# Patient Record
Sex: Male | Born: 1940
Health system: Southern US, Community
[De-identification: ages and names within clinical notes are randomized; demographics above are authoritative.]

## PROBLEM LIST (undated history)

## (undated) DIAGNOSIS — E7800 Pure hypercholesterolemia, unspecified: Secondary | ICD-10-CM

## (undated) DIAGNOSIS — I10 Essential (primary) hypertension: Secondary | ICD-10-CM

## (undated) DIAGNOSIS — Z794 Long term (current) use of insulin: Secondary | ICD-10-CM

## (undated) DIAGNOSIS — E119 Type 2 diabetes mellitus without complications: Secondary | ICD-10-CM

## (undated) DIAGNOSIS — H9190 Unspecified hearing loss, unspecified ear: Secondary | ICD-10-CM

## (undated) DIAGNOSIS — E871 Hypo-osmolality and hyponatremia: Secondary | ICD-10-CM

## (undated) HISTORY — PX: HERNIA REPAIR: SHX51

## (undated) HISTORY — PX: GUM SURGERY: SHX658

---
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
## 2019-10-08 DIAGNOSIS — I10 Essential (primary) hypertension: Secondary | ICD-10-CM | POA: Diagnosis not present

## 2019-10-08 DIAGNOSIS — E7800 Pure hypercholesterolemia, unspecified: Secondary | ICD-10-CM | POA: Diagnosis not present

## 2019-10-08 DIAGNOSIS — Z794 Long term (current) use of insulin: Secondary | ICD-10-CM | POA: Diagnosis not present

## 2019-10-08 DIAGNOSIS — E1165 Type 2 diabetes mellitus with hyperglycemia: Secondary | ICD-10-CM | POA: Diagnosis not present

## 2019-10-11 DIAGNOSIS — E119 Type 2 diabetes mellitus without complications: Secondary | ICD-10-CM | POA: Diagnosis not present

## 2019-10-29 DIAGNOSIS — Z794 Long term (current) use of insulin: Secondary | ICD-10-CM | POA: Diagnosis not present

## 2019-10-29 DIAGNOSIS — E1165 Type 2 diabetes mellitus with hyperglycemia: Secondary | ICD-10-CM | POA: Diagnosis not present

## 2019-11-15 DIAGNOSIS — Z79899 Other long term (current) drug therapy: Secondary | ICD-10-CM | POA: Diagnosis not present

## 2019-11-15 DIAGNOSIS — E7800 Pure hypercholesterolemia, unspecified: Secondary | ICD-10-CM | POA: Diagnosis not present

## 2019-11-15 DIAGNOSIS — E1165 Type 2 diabetes mellitus with hyperglycemia: Secondary | ICD-10-CM | POA: Diagnosis not present

## 2019-11-15 DIAGNOSIS — Z125 Encounter for screening for malignant neoplasm of prostate: Secondary | ICD-10-CM | POA: Diagnosis not present

## 2019-11-22 DIAGNOSIS — Z0000 Encounter for general adult medical examination without abnormal findings: Secondary | ICD-10-CM | POA: Diagnosis not present

## 2019-11-22 DIAGNOSIS — Z79899 Other long term (current) drug therapy: Secondary | ICD-10-CM | POA: Diagnosis not present

## 2019-11-22 DIAGNOSIS — Z1331 Encounter for screening for depression: Secondary | ICD-10-CM | POA: Diagnosis not present

## 2020-01-14 ENCOUNTER — Ambulatory Visit: Admit: 2020-01-14 | Payer: Self-pay | Admitting: Ophthalmology

## 2020-01-14 SURGERY — PHACOEMULSIFICATION, CATARACT, WITH IOL INSERTION
Anesthesia: Topical | Laterality: Left

## 2020-01-31 DIAGNOSIS — Z794 Long term (current) use of insulin: Secondary | ICD-10-CM | POA: Diagnosis not present

## 2020-01-31 DIAGNOSIS — I10 Essential (primary) hypertension: Secondary | ICD-10-CM | POA: Diagnosis not present

## 2020-01-31 DIAGNOSIS — E7800 Pure hypercholesterolemia, unspecified: Secondary | ICD-10-CM | POA: Diagnosis not present

## 2020-01-31 DIAGNOSIS — E1165 Type 2 diabetes mellitus with hyperglycemia: Secondary | ICD-10-CM | POA: Diagnosis not present

## 2020-02-07 DIAGNOSIS — H2512 Age-related nuclear cataract, left eye: Secondary | ICD-10-CM | POA: Diagnosis not present

## 2020-02-07 DIAGNOSIS — I10 Essential (primary) hypertension: Secondary | ICD-10-CM | POA: Diagnosis not present

## 2020-02-19 ENCOUNTER — Encounter: Payer: Self-pay | Admitting: Ophthalmology

## 2020-02-19 ENCOUNTER — Other Ambulatory Visit: Payer: Self-pay

## 2020-02-21 ENCOUNTER — Other Ambulatory Visit
Admission: RE | Admit: 2020-02-21 | Discharge: 2020-02-21 | Disposition: A | Discharge status: Home / Self Care | Payer: Medicare HMO | Source: Ambulatory Visit | Attending: Ophthalmology | Admitting: Ophthalmology

## 2020-02-21 ENCOUNTER — Other Ambulatory Visit: Payer: Self-pay

## 2020-02-21 DIAGNOSIS — Z20822 Contact with and (suspected) exposure to covid-19: Secondary | ICD-10-CM | POA: Insufficient documentation

## 2020-02-21 DIAGNOSIS — Z01812 Encounter for preprocedural laboratory examination: Secondary | ICD-10-CM | POA: Diagnosis not present

## 2020-02-21 LAB — SARS CORONAVIRUS 2 (TAT 6-24 HRS): SARS Coronavirus 2: NEGATIVE

## 2020-02-21 NOTE — Discharge Instructions (Signed)

## 2020-02-25 ENCOUNTER — Encounter: Payer: Self-pay | Admitting: Ophthalmology

## 2020-02-25 ENCOUNTER — Encounter
Admission: RE | Disposition: A | Discharge status: Home / Self Care | Payer: Self-pay | Source: Home / Self Care | Attending: Ophthalmology

## 2020-02-25 ENCOUNTER — Ambulatory Visit: Payer: Medicare HMO | Admitting: Anesthesiology

## 2020-02-25 ENCOUNTER — Ambulatory Visit
Admission: RE | Admit: 2020-02-25 | Discharge: 2020-02-25 | Disposition: A | Discharge status: Home / Self Care | Payer: Medicare HMO | Attending: Ophthalmology | Admitting: Ophthalmology

## 2020-02-25 ENCOUNTER — Other Ambulatory Visit: Payer: Self-pay

## 2020-02-25 DIAGNOSIS — Z79899 Other long term (current) drug therapy: Secondary | ICD-10-CM | POA: Diagnosis not present

## 2020-02-25 DIAGNOSIS — Z87891 Personal history of nicotine dependence: Secondary | ICD-10-CM | POA: Diagnosis not present

## 2020-02-25 DIAGNOSIS — H2512 Age-related nuclear cataract, left eye: Secondary | ICD-10-CM | POA: Insufficient documentation

## 2020-02-25 DIAGNOSIS — Z794 Long term (current) use of insulin: Secondary | ICD-10-CM | POA: Diagnosis not present

## 2020-02-25 DIAGNOSIS — H25812 Combined forms of age-related cataract, left eye: Secondary | ICD-10-CM | POA: Diagnosis not present

## 2020-02-25 DIAGNOSIS — Z7982 Long term (current) use of aspirin: Secondary | ICD-10-CM | POA: Diagnosis not present

## 2020-02-25 HISTORY — DX: Unspecified hearing loss, unspecified ear: H91.90

## 2020-02-25 HISTORY — DX: Pure hypercholesterolemia, unspecified: E78.00

## 2020-02-25 HISTORY — DX: Essential (primary) hypertension: I10

## 2020-02-25 HISTORY — PX: CATARACT EXTRACTION W/PHACO: SHX586

## 2020-02-25 HISTORY — DX: Type 2 diabetes mellitus without complications: E11.9

## 2020-02-25 LAB — GLUCOSE, CAPILLARY
Glucose-Capillary: 103 mg/dL — ABNORMAL HIGH (ref 70–99)
Glucose-Capillary: 101 mg/dL — ABNORMAL HIGH (ref 70–99)

## 2020-02-25 SURGERY — PHACOEMULSIFICATION, CATARACT, WITH IOL INSERTION
Anesthesia: Monitor Anesthesia Care | Site: Eye | Laterality: Left

## 2020-02-25 MED ORDER — LACTATED RINGERS IV SOLN: INTRAVENOUS | Status: DC

## 2020-02-25 MED ORDER — MIDAZOLAM HCL 2 MG/2ML IJ SOLN
INTRAMUSCULAR | Status: DC | PRN
  Administered 2020-02-25: 1 mg via INTRAVENOUS

## 2020-02-25 MED ORDER — SODIUM HYALURONATE 23 MG/ML IO SOLN
INTRAOCULAR | Status: DC | PRN
  Administered 2020-02-25: 0.6 mL via INTRAOCULAR

## 2020-02-25 MED ORDER — SODIUM HYALURONATE 10 MG/ML IO SOLN
INTRAOCULAR | Status: DC | PRN
  Administered 2020-02-25: 0.55 mL via INTRAOCULAR

## 2020-02-25 MED ORDER — ACETAMINOPHEN 160 MG/5ML PO SOLN: 325.0000 mg | ORAL | Status: DC | PRN

## 2020-02-25 MED ORDER — FENTANYL CITRATE (PF) 100 MCG/2ML IJ SOLN
INTRAMUSCULAR | Status: DC | PRN
  Administered 2020-02-25: 50 ug via INTRAVENOUS

## 2020-02-25 MED ORDER — LIDOCAINE HCL (PF) 2 % IJ SOLN
INTRAOCULAR | Status: DC | PRN
  Administered 2020-02-25: 1 mL via INTRAOCULAR

## 2020-02-25 MED ORDER — TETRACAINE HCL 0.5 % OP SOLN
1.0000 [drp] | OPHTHALMIC | Status: DC | PRN
  Administered 2020-02-25 (×3): 1 [drp] via OPHTHALMIC

## 2020-02-25 MED ORDER — ARMC OPHTHALMIC DILATING DROPS
1.0000 "application " | OPHTHALMIC | Status: DC | PRN
  Administered 2020-02-25 (×3): 1 via OPHTHALMIC

## 2020-02-25 MED ORDER — ACETAMINOPHEN 325 MG PO TABS: 325.0000 mg | ORAL_TABLET | ORAL | Status: DC | PRN

## 2020-02-25 MED ORDER — MOXIFLOXACIN HCL 0.5 % OP SOLN
OPHTHALMIC | Status: DC | PRN
  Administered 2020-02-25: 0.2 mL via OPHTHALMIC

## 2020-02-25 MED ORDER — EPINEPHRINE PF 1 MG/ML IJ SOLN
INTRAOCULAR | Status: DC | PRN
  Administered 2020-02-25: 60 mL via OPHTHALMIC

## 2020-02-25 SURGICAL SUPPLY — 19 items

## 2020-02-25 NOTE — Anesthesia Procedure Notes (Signed)
Procedure Name: MAC Date/Time: 02/25/2020 9:41 AM Performed by: Silvana Newness, CRNA Pre-anesthesia Checklist: Patient identified, Emergency Drugs available, Suction available, Patient being monitored and Timeout performed Patient Re-evaluated:Patient Re-evaluated prior to induction Oxygen Delivery Method: Nasal cannula Placement Confirmation: positive ETCO2

## 2020-02-25 NOTE — Op Note (Signed)
OPERATIVE NOTE  John Perez [id number] 02/25/2020   PREOPERATIVE DIAGNOSIS:  Nuclear sclerotic cataract left eye.  H25.12   POSTOPERATIVE DIAGNOSIS:    Nuclear sclerotic cataract left eye.     PROCEDURE:  Phacoemusification with posterior chamber intraocular lens placement of the left eye   LENS:   Implant Name Type Inv. Item Serial No. Manufacturer Lot No. LRB No. Used Action  LENS IOL TECNIS EYHANCE 19.0 - [id number] Intraocular Lens LENS IOL TECNIS EYHANCE 19.0 [id number] JOHNSON   Left 1 Implanted      Procedure(s) with comments: CATARACT EXTRACTION PHACO AND INTRAOCULAR LENS PLACEMENT (IOC) LEFT DIABETIC (Left) - 4.98 0:39.4  DIB00 +19.0   ULTRASOUND TIME: 0 minutes 39 seconds.  CDE 4.98   SURGEON:  Benay Pillow, MD, MPH   ANESTHESIA:  Topical with tetracaine drops augmented with 1% preservative-free intracameral lidocaine.  ESTIMATED BLOOD LOSS: <1 mL   COMPLICATIONS:  None.   DESCRIPTION OF PROCEDURE:  The patient was identified in the holding room and transported to the operating room and placed in the supine position under the operating microscope.  The left eye was identified as the operative eye and it was prepped and draped in the usual sterile ophthalmic fashion.   A 1.0 millimeter clear-corneal paracentesis was made at the 5:00 position. 0.5 ml of preservative-free 1% lidocaine with epinephrine was injected into the anterior chamber.  The anterior chamber was filled with Healon 5 viscoelastic.  A 2.4 millimeter keratome was used to make a near-clear corneal incision at the 2:00 position.  A curvilinear capsulorrhexis was made with a cystotome and capsulorrhexis forceps.  Balanced salt solution was used to hydrodissect and hydrodelineate the nucleus.   Phacoemulsification was then used in stop and chop fashion to remove the lens nucleus and epinucleus.  The remaining cortex was then removed using the irrigation and aspiration handpiece. Healon was then placed into  the capsular bag to distend it for lens placement.  A lens was then injected into the capsular bag.  The remaining viscoelastic was aspirated.   Wounds were hydrated with balanced salt solution.  The anterior chamber was inflated to a physiologic pressure with balanced salt solution.  Intracameral vigamox 0.1 mL undiltued was injected into the eye and a drop placed onto the ocular surface.  No wound leaks were noted.  The patient was taken to the recovery room in stable condition without complications of anesthesia or surgery  Benay Pillow 02/25/2020, 9:56 AM

## 2020-02-25 NOTE — H&P (Signed)
[place]   Primary Care Physician:  Derinda Late, MD Ophthalmologist: Dr. Benay Pillow  Pre-Procedure History & Physical: HPI:  John Perez is a 79 y.o. male here for cataract surgery.   Past Medical History:  Diagnosis Date   Diabetes mellitus type 2, insulin dependent (HCC)    HOH (hard of hearing)    Hypercholesteremia    Hypertension     Past Surgical History:  Procedure Laterality Date   GUM SURGERY     HERNIA REPAIR     x2    Prior to Admission medications   Medication Sig Start Date End Date Taking? Authorizing Provider  ASPIRIN 81 PO Take by mouth daily.   Yes [provider]  benazepril (LOTENSIN) 20 MG tablet Take 20 mg by mouth daily.   Yes [provider]  finasteride (PROSCAR) 5 MG tablet Take 5 mg by mouth daily.   Yes [provider]  insulin glargine (LANTUS) 100 UNIT/ML injection Inject 16 Units into the skin daily. evening   Yes [provider]  MELATONIN PO Take by mouth at bedtime as needed.   Yes [provider]  metFORMIN (GLUMETZA) 500 MG (MOD) 24 hr tablet Take 500 mg by mouth daily. With supper   Yes [provider]  Multiple Vitamin (MULTIVITAMIN PO) Take by mouth daily.   Yes [provider]  Omega-3 Fatty Acids (FISH OIL PO) Take by mouth daily.   Yes [provider]  pravastatin (PRAVACHOL) 20 MG tablet Take 20 mg by mouth daily.   Yes [provider]  tamsulosin (FLOMAX) 0.4 MG CAPS capsule Take 0.4 mg by mouth.   Yes [provider]    Allergies as of 01/09/2020   (Not on File)    History reviewed. No pertinent family history.  Social History   Socioeconomic History   Marital status: Married    Spouse name: Not on file   Number of children: Not on file   Years of education: Not on file   Highest education level: Not on file  Occupational History   Not on file  Tobacco Use   Smoking status: Former Smoker    Quit date:  1990    Years since quitting: 31.8   Smokeless tobacco: Never Used  Scientific laboratory technician Use: Never used  Substance and Sexual Activity   Alcohol use: Not Currently   Drug use: Not on file   Sexual activity: Not on file  Other Topics Concern   Not on file  Social History Narrative   Not on file   Social Determinants of Health   Financial Resource Strain:    Difficulty of Paying Living Expenses: Not on file  Food Insecurity:    Worried About Charity fundraiser in the Last Year: Not on file   [place] in the Last Year: Not on file  Transportation Needs:    Lack of Transportation (Medical): Not on file   Lack of Transportation (Non-Medical): Not on file  Physical Activity:    Days of Exercise per Week: Not on file   Minutes of Exercise per Session: Not on file  Stress:    Feeling of Stress : Not on file  Social Connections:    Frequency of Communication with Friends and Family: Not on file   Frequency of Social Gatherings with Friends and Family: Not on file   Attends Religious Services: Not on file   Active Member of Clubs or Organizations: Not  on file   Attends Club or Organization Meetings: Not on file   Marital Status: Not on file  Intimate Partner Violence:    Fear of Current or Ex-Partner: Not on file   Emotionally Abused: Not on file   Physically Abused: Not on file   Sexually Abused: Not on file    Review of Systems: See HPI, otherwise negative ROS  Physical Exam: BP (!) 146/73    Pulse 61    Temp (!) 97.4 F (36.3 C) (Temporal)    Ht 5\' 9"  (1.753 m)    Wt 69.4 kg    SpO2 99%    BMI 22.59 kg/m  General:   Alert,  pleasant and cooperative in NAD Head:  Normocephalic and atraumatic. Respiratory:  Normal work of breathing.   Impression/Plan: John Perez is here for cataract surgery.  Risks, benefits, limitations, and alternatives regarding cataract surgery have been reviewed with the patient.  Questions have been answered.   All parties agreeable.   Benay Pillow, MD  02/25/2020, 9:18 AM

## 2020-02-25 NOTE — Anesthesia Postprocedure Evaluation (Signed)
Anesthesia Post Note  Patient: John Perez  Procedure(s) Performed: CATARACT EXTRACTION PHACO AND INTRAOCULAR LENS PLACEMENT (IOC) LEFT DIABETIC (Left Eye)     Patient location during evaluation: PACU Anesthesia Type: MAC Level of consciousness: awake and alert Pain management: pain level controlled Vital Signs Assessment: post-procedure vital signs reviewed and stable Respiratory status: spontaneous breathing, nonlabored ventilation, respiratory function stable and patient connected to nasal cannula oxygen Cardiovascular status: stable and blood pressure returned to baseline Postop Assessment: no apparent nausea or vomiting Anesthetic complications: no   No complications documented.  Trecia Rogers

## 2020-02-25 NOTE — Transfer of Care (Signed)
Immediate Anesthesia Transfer of Care Note  Patient: John Perez  Procedure(s) Performed: CATARACT EXTRACTION PHACO AND INTRAOCULAR LENS PLACEMENT (IOC) LEFT DIABETIC (Left Eye)  Patient Location: PACU  Anesthesia Type: MAC  Level of Consciousness: awake, alert  and patient cooperative  Airway and Oxygen Therapy: Patient Spontanous Breathing and Patient connected to supplemental oxygen  Post-op Assessment: Post-op Vital signs reviewed, Patient's Cardiovascular Status Stable, Respiratory Function Stable, Patent Airway and No signs of Nausea or vomiting  Post-op Vital Signs: Reviewed and stable  Complications: No complications documented.

## 2020-02-25 NOTE — Anesthesia Preprocedure Evaluation (Addendum)
Anesthesia Evaluation  Patient identified by MRN, date of birth, ID band Patient awake    Reviewed: Allergy & Precautions, H&P , NPO status , Patient's Chart, lab work & pertinent test results, reviewed documented beta blocker date and time   Airway Mallampati: II  TM Distance: >3 FB Neck ROM: full    Dental no notable dental hx.    Pulmonary former smoker,    Pulmonary exam normal breath sounds clear to auscultation       Cardiovascular Exercise Tolerance: Good hypertension, Normal cardiovascular exam Rhythm:regular Rate:Normal     Neuro/Psych negative neurological ROS  negative psych ROS   GI/Hepatic negative GI ROS, Neg liver ROS,   Endo/Other  diabetes, Type 2  Renal/GU negative Renal ROS  negative genitourinary   Musculoskeletal   Abdominal   Peds  Hematology negative hematology ROS (+)   Anesthesia Other Findings   Reproductive/Obstetrics negative OB ROS                            Anesthesia Physical Anesthesia Plan  ASA: II  Anesthesia Plan: MAC   Post-op Pain Management:    Induction:   PONV Risk Score and Plan:   Airway Management Planned:   Additional Equipment:   Intra-op Plan:   Post-operative Plan:   Informed Consent: I have reviewed the patients History and Physical, chart, labs and discussed the procedure including the risks, benefits and alternatives for the proposed anesthesia with the patient or authorized representative who has indicated his/her understanding and acceptance.     Dental Advisory Given  Plan Discussed with: CRNA  Anesthesia Plan Comments:         Anesthesia Quick Evaluation

## 2020-02-26 ENCOUNTER — Encounter: Payer: Self-pay | Admitting: Ophthalmology

## 2020-02-28 DIAGNOSIS — E119 Type 2 diabetes mellitus without complications: Secondary | ICD-10-CM | POA: Diagnosis not present

## 2020-02-28 DIAGNOSIS — H2511 Age-related nuclear cataract, right eye: Secondary | ICD-10-CM | POA: Diagnosis not present

## 2020-03-13 ENCOUNTER — Other Ambulatory Visit: Payer: Self-pay

## 2020-03-13 ENCOUNTER — Other Ambulatory Visit
Admission: RE | Admit: 2020-03-13 | Discharge: 2020-03-13 | Disposition: A | Discharge status: Home / Self Care | Payer: Medicare HMO | Source: Ambulatory Visit | Attending: Ophthalmology | Admitting: Ophthalmology

## 2020-03-13 DIAGNOSIS — Z01812 Encounter for preprocedural laboratory examination: Secondary | ICD-10-CM | POA: Insufficient documentation

## 2020-03-13 DIAGNOSIS — Z20822 Contact with and (suspected) exposure to covid-19: Secondary | ICD-10-CM | POA: Insufficient documentation

## 2020-03-13 LAB — SARS CORONAVIRUS 2 (TAT 6-24 HRS): SARS Coronavirus 2: NEGATIVE

## 2020-03-13 NOTE — Discharge Instructions (Signed)

## 2020-03-16 LAB — SARS CORONAVIRUS 2 (TAT 6-24 HRS): SARS Coronavirus 2: NEGATIVE

## 2020-03-17 ENCOUNTER — Ambulatory Visit: Payer: Medicare HMO | Admitting: Anesthesiology

## 2020-03-17 ENCOUNTER — Other Ambulatory Visit: Payer: Self-pay

## 2020-03-17 ENCOUNTER — Encounter: Payer: Self-pay | Admitting: Ophthalmology

## 2020-03-17 ENCOUNTER — Encounter
Admission: RE | Disposition: A | Discharge status: Home / Self Care | Payer: Self-pay | Source: Home / Self Care | Attending: Ophthalmology

## 2020-03-17 ENCOUNTER — Ambulatory Visit
Admission: RE | Admit: 2020-03-17 | Discharge: 2020-03-17 | Disposition: A | Discharge status: Home / Self Care | Payer: Medicare HMO | Attending: Ophthalmology | Admitting: Ophthalmology

## 2020-03-17 DIAGNOSIS — H25811 Combined forms of age-related cataract, right eye: Secondary | ICD-10-CM | POA: Diagnosis not present

## 2020-03-17 DIAGNOSIS — Z79899 Other long term (current) drug therapy: Secondary | ICD-10-CM | POA: Diagnosis not present

## 2020-03-17 DIAGNOSIS — Z87891 Personal history of nicotine dependence: Secondary | ICD-10-CM | POA: Diagnosis not present

## 2020-03-17 DIAGNOSIS — E1136 Type 2 diabetes mellitus with diabetic cataract: Secondary | ICD-10-CM | POA: Insufficient documentation

## 2020-03-17 DIAGNOSIS — H2511 Age-related nuclear cataract, right eye: Secondary | ICD-10-CM | POA: Diagnosis not present

## 2020-03-17 DIAGNOSIS — Z7982 Long term (current) use of aspirin: Secondary | ICD-10-CM | POA: Diagnosis not present

## 2020-03-17 DIAGNOSIS — Z794 Long term (current) use of insulin: Secondary | ICD-10-CM | POA: Insufficient documentation

## 2020-03-17 HISTORY — PX: CATARACT EXTRACTION W/PHACO: SHX586

## 2020-03-17 LAB — GLUCOSE, CAPILLARY
Glucose-Capillary: 110 mg/dL — ABNORMAL HIGH (ref 70–99)
Glucose-Capillary: 100 mg/dL — ABNORMAL HIGH (ref 70–99)

## 2020-03-17 SURGERY — PHACOEMULSIFICATION, CATARACT, WITH IOL INSERTION
Anesthesia: Monitor Anesthesia Care | Site: Eye | Laterality: Right

## 2020-03-17 MED ORDER — ACETAMINOPHEN 160 MG/5ML PO SOLN: 325.0000 mg | ORAL | Status: DC | PRN

## 2020-03-17 MED ORDER — ARMC OPHTHALMIC DILATING DROPS
1.0000 "application " | OPHTHALMIC | Status: DC | PRN
  Administered 2020-03-17 (×3): 1 via OPHTHALMIC

## 2020-03-17 MED ORDER — MOXIFLOXACIN HCL 0.5 % OP SOLN
OPHTHALMIC | Status: DC | PRN
  Administered 2020-03-17: 0.2 mL via OPHTHALMIC

## 2020-03-17 MED ORDER — ONDANSETRON HCL 4 MG/2ML IJ SOLN: 4.0000 mg | Freq: Once | INTRAMUSCULAR | Status: DC | PRN

## 2020-03-17 MED ORDER — FENTANYL CITRATE (PF) 100 MCG/2ML IJ SOLN
INTRAMUSCULAR | Status: DC | PRN
  Administered 2020-03-17: 50 ug via INTRAVENOUS

## 2020-03-17 MED ORDER — ACETAMINOPHEN 325 MG PO TABS: 325.0000 mg | ORAL_TABLET | ORAL | Status: DC | PRN

## 2020-03-17 MED ORDER — SODIUM HYALURONATE 10 MG/ML IO SOLN
INTRAOCULAR | Status: DC | PRN
  Administered 2020-03-17: 0.55 mL via INTRAOCULAR

## 2020-03-17 MED ORDER — EPINEPHRINE PF 1 MG/ML IJ SOLN
INTRAOCULAR | Status: DC | PRN
  Administered 2020-03-17: 95 mL via OPHTHALMIC

## 2020-03-17 MED ORDER — LIDOCAINE HCL (PF) 2 % IJ SOLN
INTRAOCULAR | Status: DC | PRN
  Administered 2020-03-17: 1 mL via INTRAOCULAR

## 2020-03-17 MED ORDER — SODIUM HYALURONATE 23 MG/ML IO SOLN
INTRAOCULAR | Status: DC | PRN
  Administered 2020-03-17: 0.6 mL via INTRAOCULAR

## 2020-03-17 MED ORDER — MIDAZOLAM HCL 2 MG/2ML IJ SOLN
INTRAMUSCULAR | Status: DC | PRN
  Administered 2020-03-17: 1 mg via INTRAVENOUS

## 2020-03-17 MED ORDER — TETRACAINE HCL 0.5 % OP SOLN
1.0000 [drp] | OPHTHALMIC | Status: DC | PRN
  Administered 2020-03-17 (×3): 1 [drp] via OPHTHALMIC

## 2020-03-17 SURGICAL SUPPLY — 19 items
CANNULA ANT/CHMB 27G (MISCELLANEOUS) ×2 IMPLANT
CANNULA ANT/CHMB 27GA (MISCELLANEOUS) ×6 IMPLANT
DISSECTOR HYDRO NUCLEUS 50X22 (MISCELLANEOUS) ×3 IMPLANT
GLOVE SURG LX 7.5 STRW (GLOVE) ×4
GLOVE SURG LX STRL 7.5 STRW (GLOVE) ×1 IMPLANT
GLOVE SURG SYN 8.5  E (GLOVE) ×2
GLOVE SURG SYN 8.5 E (GLOVE) ×1 IMPLANT
GLOVE SURG SYN 8.5 PF PI (GLOVE) ×1 IMPLANT
GOWN STRL REUS W/ TWL LRG LVL3 (GOWN DISPOSABLE) ×2 IMPLANT
GOWN STRL REUS W/TWL LRG LVL3 (GOWN DISPOSABLE) ×6
LENS IOL TECNIS EYHANCE 16.0 (Intraocular Lens) ×2 IMPLANT
MARKER SKIN DUAL TIP RULER LAB (MISCELLANEOUS) ×3 IMPLANT
PACK DR. KING ARMS (PACKS) ×3 IMPLANT
PACK EYE AFTER SURG (MISCELLANEOUS) ×3 IMPLANT
PACK OPTHALMIC (MISCELLANEOUS) ×3 IMPLANT
SYR 3ML LL SCALE MARK (SYRINGE) ×3 IMPLANT
SYR TB 1ML LUER SLIP (SYRINGE) ×3 IMPLANT
WATER STERILE IRR 250ML POUR (IV SOLUTION) ×3 IMPLANT
WIPE NON LINTING 3.25X3.25 (MISCELLANEOUS) ×3 IMPLANT

## 2020-03-17 NOTE — H&P (Signed)
[place]   Primary Care Physician:  Derinda Late, MD Ophthalmologist: Dr. Benay Pillow  Pre-Procedure History & Physical: HPI:  John Perez is a 79 y.o. male here for cataract surgery.   Past Medical History:  Diagnosis Date  . Diabetes mellitus type 2, insulin dependent ([place])   . HOH (hard of hearing)   . Hypercholesteremia   . Hypertension     Past Surgical History:  Procedure Laterality Date  . CATARACT EXTRACTION W/PHACO Left 02/25/2020   Procedure: CATARACT EXTRACTION PHACO AND INTRAOCULAR LENS PLACEMENT ([place]) LEFT DIABETIC;  Surgeon: Eulogio Bear, MD;  Location: [facility];  Service: Ophthalmology;  Laterality: Left;  4.98 0:39.4  . GUM SURGERY    . HERNIA REPAIR     x2    Prior to Admission medications   Medication Sig Start Date End Date Taking? Authorizing Provider  ASPIRIN 81 PO Take by mouth daily.   Yes [provider]  benazepril (LOTENSIN) 20 MG tablet Take 20 mg by mouth daily.   Yes [provider]  finasteride (PROSCAR) 5 MG tablet Take 5 mg by mouth daily.   Yes [provider]  insulin glargine (LANTUS) 100 UNIT/ML injection Inject 16 Units into the skin daily. evening   Yes [provider]  MELATONIN PO Take by mouth at bedtime as needed.   Yes [provider]  metFORMIN (GLUMETZA) 500 MG (MOD) 24 hr tablet Take 500 mg by mouth daily. With supper   Yes [provider]  Multiple Vitamin (MULTIVITAMIN PO) Take by mouth daily.   Yes [provider]  Omega-3 Fatty Acids (FISH OIL PO) Take by mouth daily.   Yes [provider]  pravastatin (PRAVACHOL) 20 MG tablet Take 20 mg by mouth daily.   Yes [provider]  tamsulosin (FLOMAX) 0.4 MG CAPS capsule Take 0.4 mg by mouth.   Yes [provider]    Allergies as of 02/28/2020  . (No Known Allergies)    History reviewed. No pertinent family history.  Social History   Socioeconomic History   . Marital status: Married    Spouse name: Not on file  . Number of children: Not on file  . Years of education: Not on file  . Highest education level: Not on file  Occupational History  . Not on file  Tobacco Use  . Smoking status: Former Smoker    Quit date: 1990    Years since quitting: 31.9  . Smokeless tobacco: Never Used  Vaping Use  . Vaping Use: Never used  Substance and Sexual Activity  . Alcohol use: Not Currently  . Drug use: Not on file  . Sexual activity: Not on file  Other Topics Concern  . Not on file  Social History Narrative  . Not on file   Social Determinants of Health   Financial Resource Strain:   . Difficulty of Paying Living Expenses: Not on file  Food Insecurity:   . Worried About Charity fundraiser in the Last Year: Not on file  . Ran Out of Food in the Last Year: Not on file  Transportation Needs:   . Lack of Transportation (Medical): Not on file  . Lack of Transportation (Non-Medical): Not on file  Physical Activity:   . Days of Exercise per Week: Not on file  . Minutes of Exercise per Session: Not on file  Stress:   . Feeling of Stress : Not on file  Social Connections:   .  Frequency of Communication with Friends and Family: Not on file  . Frequency of Social Gatherings with Friends and Family: Not on file  . Attends Religious Services: Not on file  . Active Member of Clubs or Organizations: Not on file  . Attends Archivist Meetings: Not on file  . Marital Status: Not on file  Intimate Partner Violence:   . Fear of Current or Ex-Partner: Not on file  . Emotionally Abused: Not on file  . Physically Abused: Not on file  . Sexually Abused: Not on file    Review of Systems: See HPI, otherwise negative ROS  Physical Exam: BP (!) 176/69   Pulse 63   Temp (!) 97 F (36.1 C) (Temporal)   Resp 16   Ht 5\' 9"  (1.753 m)   Wt 69.9 kg   SpO2 100%   BMI 22.74 kg/m  General:   Alert,  pleasant and cooperative in NAD Head:   Normocephalic and atraumatic. Respiratory:  Normal work of breathing.  Impression/Plan: John Perez is here for cataract surgery.  Risks, benefits, limitations, and alternatives regarding cataract surgery have been reviewed with the patient.  Questions have been answered.  All parties agreeable.   Benay Pillow, MD  03/17/2020, 10:56 AM

## 2020-03-17 NOTE — Transfer of Care (Signed)
Immediate Anesthesia Transfer of Care Note  Patient: John Perez  Procedure(s) Performed: CATARACT EXTRACTION PHACO AND INTRAOCULAR LENS PLACEMENT (IOC) RIGHT DIABETIC (Right Eye)  Patient Location: PACU  Anesthesia Type: MAC  Level of Consciousness: awake, alert  and patient cooperative  Airway and Oxygen Therapy: Patient Spontanous Breathing and Patient connected to supplemental oxygen  Post-op Assessment: Post-op Vital signs reviewed, Patient's Cardiovascular Status Stable, Respiratory Function Stable, Patent Airway and No signs of Nausea or vomiting  Post-op Vital Signs: Reviewed and stable  Complications: No complications documented.

## 2020-03-17 NOTE — Anesthesia Preprocedure Evaluation (Signed)
Anesthesia Evaluation  Patient identified by MRN, date of birth, ID band Patient awake    Reviewed: Allergy & Precautions, H&P , NPO status   Airway Mallampati: II  TM Distance: >3 FB Neck ROM: full    Dental   Pulmonary former smoker,    breath sounds clear to auscultation       Cardiovascular Exercise Tolerance: Good hypertension,  Rhythm:regular Rate:Normal     Neuro/Psych    GI/Hepatic   Endo/Other  diabetes, Type 2  Renal/GU      Musculoskeletal   Abdominal   Peds  Hematology   Anesthesia Other Findings   Reproductive/Obstetrics                             Anesthesia Physical  Anesthesia Plan  ASA: II  Anesthesia Plan: MAC   Post-op Pain Management:    Induction: Intravenous  PONV Risk Score and Plan: TIVA, Midazolam and Treatment may vary due to age or medical condition  Airway Management Planned: Natural Airway and Nasal Cannula  Additional Equipment:   Intra-op Plan:   Post-operative Plan:   Informed Consent: I have reviewed the patients History and Physical, chart, labs and discussed the procedure including the risks, benefits and alternatives for the proposed anesthesia with the patient or authorized representative who has indicated his/her understanding and acceptance.     Dental Advisory Given  Plan Discussed with: CRNA  Anesthesia Plan Comments:         Anesthesia Quick Evaluation

## 2020-03-17 NOTE — Anesthesia Postprocedure Evaluation (Signed)
Anesthesia Post Note  Patient: John Perez  Procedure(s) Performed: CATARACT EXTRACTION PHACO AND INTRAOCULAR LENS PLACEMENT (IOC) RIGHT DIABETIC (Right Eye)     Patient location during evaluation: PACU Anesthesia Type: MAC Level of consciousness: awake Pain management: pain level controlled Vital Signs Assessment: post-procedure vital signs reviewed and stable Respiratory status: respiratory function stable Cardiovascular status: stable Postop Assessment: no apparent nausea or vomiting Anesthetic complications: no   No complications documented.  Veda Canning

## 2020-03-17 NOTE — Op Note (Signed)
OPERATIVE NOTE  Griffen Frayne [id number] 03/17/2020   PREOPERATIVE DIAGNOSIS:  Nuclear sclerotic cataract right eye.  H25.11   POSTOPERATIVE DIAGNOSIS:    Nuclear sclerotic cataract right eye.     PROCEDURE:  Phacoemusification with posterior chamber intraocular lens placement of the right eye   LENS:   Implant Name Type Inv. Item Serial No. Manufacturer Lot No. LRB No. Used Action  LENS IOL TECNIS EYHANCE 16.0 - [id number] Intraocular Lens LENS IOL TECNIS EYHANCE 16.0 [id number] [id number]   Right 1 Implanted       Procedure(s) with comments: CATARACT EXTRACTION PHACO AND INTRAOCULAR LENS PLACEMENT (IOC) RIGHT DIABETIC (Right) - 6.33 0:55.3  DIB00 +16.0   ULTRASOUND TIME: 0 minutes 55 seconds.  CDE 6.33   SURGEON:  Benay Pillow, MD, MPH  ANESTHESIOLOGIST: Anesthesiologist: Veda Canning, MD CRNA: Cameron Ali, CRNA   ANESTHESIA:  Topical with tetracaine drops augmented with 1% preservative-free intracameral lidocaine.  ESTIMATED BLOOD LOSS: less than 1 mL.   COMPLICATIONS:  None.   DESCRIPTION OF PROCEDURE:  The patient was identified in the holding room and transported to the operating room and placed in the supine position under the operating microscope.  The right eye was identified as the operative eye and it was prepped and draped in the usual sterile ophthalmic fashion.   A 1.0 millimeter clear-corneal paracentesis was made at the 10:30 position. 0.5 ml of preservative-free 1% lidocaine with epinephrine was injected into the anterior chamber.  The anterior chamber was filled with Healon 5 viscoelastic.  A 2.4 millimeter keratome was used to make a near-clear corneal incision at the 8:00 position.  A curvilinear capsulorrhexis was made with a cystotome and capsulorrhexis forceps.  Balanced salt solution was used to hydrodissect and hydrodelineate the nucleus.   Phacoemulsification was then used in stop and chop fashion to remove the lens nucleus and epinucleus.  The  remaining cortex was then removed using the irrigation and aspiration handpiece. Healon was then placed into the capsular bag to distend it for lens placement.  A lens was then injected into the capsular bag.  The remaining viscoelastic was aspirated.   Wounds were hydrated with balanced salt solution.  The anterior chamber was inflated to a physiologic pressure with balanced salt solution.   Intracameral vigamox 0.1 mL undiluted was injected into the eye and a drop placed onto the ocular surface.  No wound leaks were noted.  The patient was taken to the recovery room in stable condition without complications of anesthesia or surgery  Benay Pillow 03/17/2020, 11:24 AM

## 2020-03-18 ENCOUNTER — Encounter: Payer: Self-pay | Admitting: Ophthalmology

## 2020-05-05 DIAGNOSIS — I10 Essential (primary) hypertension: Secondary | ICD-10-CM | POA: Diagnosis not present

## 2020-05-05 DIAGNOSIS — E1165 Type 2 diabetes mellitus with hyperglycemia: Secondary | ICD-10-CM | POA: Diagnosis not present

## 2020-05-05 DIAGNOSIS — Z794 Long term (current) use of insulin: Secondary | ICD-10-CM | POA: Diagnosis not present

## 2020-05-05 DIAGNOSIS — E7800 Pure hypercholesterolemia, unspecified: Secondary | ICD-10-CM | POA: Diagnosis not present

## 2020-05-20 DIAGNOSIS — Z79899 Other long term (current) drug therapy: Secondary | ICD-10-CM | POA: Diagnosis not present

## 2020-05-20 DIAGNOSIS — E119 Type 2 diabetes mellitus without complications: Secondary | ICD-10-CM | POA: Diagnosis not present

## 2020-05-20 DIAGNOSIS — E7800 Pure hypercholesterolemia, unspecified: Secondary | ICD-10-CM | POA: Diagnosis not present

## 2020-05-27 DIAGNOSIS — I10 Essential (primary) hypertension: Secondary | ICD-10-CM | POA: Diagnosis not present

## 2020-05-27 DIAGNOSIS — E785 Hyperlipidemia, unspecified: Secondary | ICD-10-CM | POA: Diagnosis not present

## 2020-05-27 DIAGNOSIS — N400 Enlarged prostate without lower urinary tract symptoms: Secondary | ICD-10-CM | POA: Diagnosis not present

## 2020-05-27 DIAGNOSIS — Z79899 Other long term (current) drug therapy: Secondary | ICD-10-CM | POA: Diagnosis not present

## 2020-05-27 DIAGNOSIS — E119 Type 2 diabetes mellitus without complications: Secondary | ICD-10-CM | POA: Diagnosis not present

## 2020-05-27 DIAGNOSIS — Z125 Encounter for screening for malignant neoplasm of prostate: Secondary | ICD-10-CM | POA: Diagnosis not present

## 2020-07-29 DIAGNOSIS — H903 Sensorineural hearing loss, bilateral: Secondary | ICD-10-CM | POA: Diagnosis not present

## 2020-08-01 DIAGNOSIS — H903 Sensorineural hearing loss, bilateral: Secondary | ICD-10-CM | POA: Diagnosis not present

## 2020-08-15 DIAGNOSIS — Z794 Long term (current) use of insulin: Secondary | ICD-10-CM | POA: Diagnosis not present

## 2020-08-15 DIAGNOSIS — E1165 Type 2 diabetes mellitus with hyperglycemia: Secondary | ICD-10-CM | POA: Diagnosis not present

## 2020-08-15 DIAGNOSIS — I10 Essential (primary) hypertension: Secondary | ICD-10-CM | POA: Diagnosis not present

## 2020-08-15 DIAGNOSIS — E7800 Pure hypercholesterolemia, unspecified: Secondary | ICD-10-CM | POA: Diagnosis not present

## 2020-08-23 ENCOUNTER — Other Ambulatory Visit: Payer: Self-pay

## 2020-08-23 DIAGNOSIS — I10 Essential (primary) hypertension: Secondary | ICD-10-CM | POA: Diagnosis not present

## 2020-08-23 DIAGNOSIS — Z794 Long term (current) use of insulin: Secondary | ICD-10-CM | POA: Insufficient documentation

## 2020-08-23 DIAGNOSIS — Z87891 Personal history of nicotine dependence: Secondary | ICD-10-CM | POA: Diagnosis not present

## 2020-08-23 DIAGNOSIS — Z7984 Long term (current) use of oral hypoglycemic drugs: Secondary | ICD-10-CM | POA: Diagnosis not present

## 2020-08-23 DIAGNOSIS — E119 Type 2 diabetes mellitus without complications: Secondary | ICD-10-CM | POA: Diagnosis not present

## 2020-08-23 DIAGNOSIS — Z7189 Other specified counseling: Secondary | ICD-10-CM | POA: Diagnosis not present

## 2020-08-23 DIAGNOSIS — Z7982 Long term (current) use of aspirin: Secondary | ICD-10-CM | POA: Insufficient documentation

## 2020-08-23 DIAGNOSIS — Z711 Person with feared health complaint in whom no diagnosis is made: Secondary | ICD-10-CM | POA: Diagnosis not present

## 2020-08-23 LAB — CBG MONITORING, ED: Glucose-Capillary: 179 mg/dL — ABNORMAL HIGH (ref 70–99)

## 2020-08-23 NOTE — ED Triage Notes (Signed)
fri night took normal 14u lantus, sat am ?14u, 'I normally leave part of pen cap on desk so that I know that I took it and this evening there was 2 caps and I just want to be sure I didn't over dose myself,' reports took 7units lantus 1830. Denies any new confusion, pain or complaints 'I just dont know much about insulin as I just started it 1 yr ago and I saw my pmd on 5/6 and we adjusted the schedule then readjusted it this past Thursday so I am trying to get back on track.'

## 2020-08-24 ENCOUNTER — Emergency Department
Admission: EM | Admit: 2020-08-24 | Discharge: 2020-08-24 | Disposition: A | Discharge status: Home / Self Care | Payer: Medicare HMO | Attending: Emergency Medicine | Admitting: Emergency Medicine

## 2020-08-24 DIAGNOSIS — Z711 Person with feared health complaint in whom no diagnosis is made: Secondary | ICD-10-CM

## 2020-08-24 LAB — CBG MONITORING, ED: Glucose-Capillary: 138 mg/dL — ABNORMAL HIGH (ref 70–99)

## 2020-08-24 NOTE — Discharge Instructions (Addendum)
Your blood glucose today was normal.  I feel you are safe to go home and resume your nightly insulin as prescribed.  Please monitor for any symptoms of hypoglycemia including confusion, weakness, sweating, vomiting, seizures.  If you have a blood sugar of 60 or less and are unable to get it to rise with eating and drinking, please call 911 and come to the emergency department.

## 2020-08-24 NOTE — ED Provider Notes (Signed)
[facility] Emergency Department Provider Note  ____________________________________________   Event Date/Time   First MD Initiated Contact with Patient 08/24/20 [phone]     (approximate)  I have reviewed the triage vital signs and the nursing notes.   HISTORY  Chief Complaint Blood Sugar Problem    HPI John Perez is a 80 y.o. male with history of insulin-dependent diabetes, hypertension, hyperlipidemia who presents to the emergency department with concerns that he may have accidentally doubled up on his insulin this morning at 8 AM.  States that at 8 AM he took 14 units of Lantus.  He states he is not sure if he accidentally took this twice.  He then took Lantus 6 units at 8 PM tonight.  He states he is feeling fine but just got nervous and wanted to be checked out.  No dizziness, diaphoresis, chest pain, shortness of breath, fevers, cough, vomiting or diarrhea.  States that if he did overdose this was unintentional.     Past Medical History:  Diagnosis Date  . Diabetes mellitus type 2, insulin dependent ([place])   . HOH (hard of hearing)   . Hypercholesteremia   . Hypertension     There are no problems to display for this patient.   Past Surgical History:  Procedure Laterality Date  . CATARACT EXTRACTION W/PHACO Left 02/25/2020   Procedure: CATARACT EXTRACTION PHACO AND INTRAOCULAR LENS PLACEMENT ([place]) LEFT DIABETIC;  Surgeon: Eulogio Bear, MD;  Location: [place];  Service: Ophthalmology;  Laterality: Left;  4.98 0:39.4  . CATARACT EXTRACTION W/PHACO Right 03/17/2020   Procedure: CATARACT EXTRACTION PHACO AND INTRAOCULAR LENS PLACEMENT ([place]) RIGHT DIABETIC;  Surgeon: Eulogio Bear, MD;  Location: [place];  Service: Ophthalmology;  Laterality: Right;  6.33 0:55.3  . GUM SURGERY    . HERNIA REPAIR     x2    Prior to Admission medications   Medication Sig Start Date End Date Taking? Authorizing Provider  ASPIRIN 81  PO Take by mouth daily.    [provider]  benazepril (LOTENSIN) 20 MG tablet Take 20 mg by mouth daily.    [provider]  finasteride (PROSCAR) 5 MG tablet Take 5 mg by mouth daily.    [provider]  insulin glargine (LANTUS) 100 UNIT/ML injection Inject 16 Units into the skin daily. evening    [provider]  MELATONIN PO Take by mouth at bedtime as needed.    [provider]  metFORMIN (GLUMETZA) 500 MG (MOD) 24 hr tablet Take 500 mg by mouth daily. With supper    [provider]  Multiple Vitamin (MULTIVITAMIN PO) Take by mouth daily.    [provider]  Omega-3 Fatty Acids (FISH OIL PO) Take by mouth daily.    [provider]  pravastatin (PRAVACHOL) 20 MG tablet Take 20 mg by mouth daily.    [provider]  tamsulosin (FLOMAX) 0.4 MG CAPS capsule Take 0.4 mg by mouth.    [provider]    Allergies Patient has no known allergies.  History reviewed. No pertinent family history.  Social History Social History   Tobacco Use  . Smoking status: Former Smoker    Quit date: 1990    Years since quitting: 32.3  . Smokeless tobacco: Never Used  Vaping Use  . Vaping Use: Never used  Substance Use Topics  . Alcohol use: Not Currently    Review of Systems Constitutional: No fever. Eyes: No visual changes. ENT:  No sore throat. Cardiovascular: Denies chest pain. Respiratory: Denies shortness of breath. Gastrointestinal: No nausea, vomiting, diarrhea. Genitourinary: Negative for dysuria. Musculoskeletal: Negative for back pain. Skin: Negative for rash. Neurological: Negative for focal weakness or numbness.  ____________________________________________   PHYSICAL EXAM:  VITAL SIGNS: ED Triage Vitals  Enc Vitals Group     BP 08/23/20 2241 (!) 167/121     Pulse Rate 08/23/20 2241 80     Resp 08/23/20 2241 12     Temp 08/23/20 2241 98 F (36.7 C)     Temp Source 08/23/20 2241  Oral     SpO2 08/23/20 2241 99 %     Weight 08/23/20 2228 154 lb 5.2 oz (70 kg)     Height 08/23/20 2228 5\' 9"  (1.753 m)     Head Circumference --      Peak Flow --      Pain Score 08/23/20 2228 0     Pain Loc --      Pain Edu? --      Excl. in [place]? --    CONSTITUTIONAL: Alert and oriented and responds appropriately to questions. Well-appearing; well-nourished, well-appearing HEAD: Normocephalic EYES: Conjunctivae clear, pupils appear equal, EOM appear intact ENT: normal nose; moist mucous membranes NECK: Supple, normal ROM CARD: RRR; S1 and S2 appreciated; no murmurs, no clicks, no rubs, no gallops RESP: Normal chest excursion without splinting or tachypnea; breath sounds clear and equal bilaterally; no wheezes, no rhonchi, no rales, no hypoxia or respiratory distress, speaking full sentences ABD/GI: Normal bowel sounds; non-distended; soft, non-tender, no rebound, no guarding, no peritoneal signs, no hepatosplenomegaly BACK: The back appears normal EXT: Normal ROM in all joints; no deformity noted, no edema; no cyanosis SKIN: Normal color for age and race; warm; no rash on exposed skin NEURO: Moves all extremities equally, ambulates with normal gait, normal speech, no facial asymmetry PSYCH: The patient's mood and manner are appropriate.  ____________________________________________   LABS (all labs ordered are listed, but only abnormal results are displayed)  Labs Reviewed  CBG MONITORING, ED - Abnormal; Notable for the following components:      Result Value   Glucose-Capillary 179 (*)    All other components within normal limits  CBG MONITORING, ED - Abnormal; Notable for the following components:   Glucose-Capillary 138 (*)    All other components within normal limits   ____________________________________________  EKG   ____________________________________________  RADIOLOGY I, Tykeisha Peer, personally viewed and evaluated these images (plain radiographs) as part  of my medical decision making, as well as reviewing the written report by the radiologist.  ED MD interpretation:    Official radiology report(s): No results found.  ____________________________________________   PROCEDURES  Procedure(s) performed (including Critical Care):  Procedures    ____________________________________________   INITIAL IMPRESSION / ASSESSMENT AND PLAN / ED COURSE  As part of my medical decision making, I reviewed the following data within the [place] History obtained from family, Nursing notes reviewed and incorporated, Old chart reviewed and Notes from prior ED visits         Patient here with concerns that he may have overdosed insulin over 12 hours ago.  He has no complaints at this time.  Blood sugar x2 has been normal here.  He has no history of renal issues that would cause concern that he may become hypoglycemic due to inability to clear insulin.  He states that if he did overdose this was unintentional.  He states that he just  became concerned and wanted to be checked out.  He is extremely well-appearing here, hemodynamically stable.  I feel he is safe for discharge.  He will resume his normal scheduled Lantus evening of 08/24/2020.  At this time, I do not feel there is any life-threatening condition present. I have reviewed, interpreted and discussed all results (EKG, imaging, lab, urine as appropriate) and exam findings with patient/family. I have reviewed nursing notes and appropriate previous records.  I feel the patient is safe to be discharged home without further emergent workup and can continue workup as an outpatient as needed. Discussed usual and customary return precautions. Patient/family verbalize understanding and are comfortable with this plan.  Outpatient follow-up has been provided as needed. All questions have been answered.   FINAL CLINICAL IMPRESSION(S) / ED DIAGNOSES  Final diagnoses:  Physically well but  worried     ED Discharge Orders    None      *Please note:  Breylon Sherrow was evaluated in Emergency Department on 08/24/2020 for the symptoms described in the history of present illness. He was evaluated in the context of the global COVID-19 pandemic, which necessitated consideration that the patient might be at risk for infection with the SARS-CoV-2 virus that causes COVID-19. Institutional protocols and algorithms that pertain to the evaluation of patients at risk for COVID-19 are in a state of rapid change based on information released by regulatory bodies including the CDC and federal and state organizations. These policies and algorithms were followed during the patient's care in the ED.  Some ED evaluations and interventions may be delayed as a result of limited staffing during and the pandemic.*   Note:  This document was prepared using Dragon voice recognition software and may include unintentional dictation errors.   Keron Koffman, Delice Bison, DO 08/24/20 [phone]

## 2020-08-24 NOTE — ED Notes (Signed)
Pt A&Ox4. Patient states he is afraid he "overdosed on lantus". Patient is able to ambulate freely.

## 2020-11-19 DIAGNOSIS — E1122 Type 2 diabetes mellitus with diabetic chronic kidney disease: Secondary | ICD-10-CM | POA: Diagnosis not present

## 2020-11-19 DIAGNOSIS — Z125 Encounter for screening for malignant neoplasm of prostate: Secondary | ICD-10-CM | POA: Diagnosis not present

## 2020-11-19 DIAGNOSIS — N182 Chronic kidney disease, stage 2 (mild): Secondary | ICD-10-CM | POA: Diagnosis not present

## 2020-11-19 DIAGNOSIS — I129 Hypertensive chronic kidney disease with stage 1 through stage 4 chronic kidney disease, or unspecified chronic kidney disease: Secondary | ICD-10-CM | POA: Diagnosis not present

## 2020-11-19 DIAGNOSIS — E7800 Pure hypercholesterolemia, unspecified: Secondary | ICD-10-CM | POA: Diagnosis not present

## 2020-11-19 DIAGNOSIS — Z79899 Other long term (current) drug therapy: Secondary | ICD-10-CM | POA: Diagnosis not present

## 2020-12-01 DIAGNOSIS — I10 Essential (primary) hypertension: Secondary | ICD-10-CM | POA: Diagnosis not present

## 2020-12-01 DIAGNOSIS — E1165 Type 2 diabetes mellitus with hyperglycemia: Secondary | ICD-10-CM | POA: Diagnosis not present

## 2020-12-01 DIAGNOSIS — Z794 Long term (current) use of insulin: Secondary | ICD-10-CM | POA: Diagnosis not present

## 2020-12-02 DIAGNOSIS — Z1331 Encounter for screening for depression: Secondary | ICD-10-CM | POA: Diagnosis not present

## 2020-12-02 DIAGNOSIS — Z0000 Encounter for general adult medical examination without abnormal findings: Secondary | ICD-10-CM | POA: Diagnosis not present

## 2020-12-08 ENCOUNTER — Emergency Department
Admission: EM | Admit: 2020-12-08 | Discharge: 2020-12-08 | Disposition: A | Discharge status: Home / Self Care | Payer: Medicare HMO | Attending: Emergency Medicine | Admitting: Emergency Medicine

## 2020-12-08 ENCOUNTER — Encounter: Payer: Self-pay | Admitting: *Deleted

## 2020-12-08 ENCOUNTER — Other Ambulatory Visit: Payer: Self-pay

## 2020-12-08 DIAGNOSIS — I10 Essential (primary) hypertension: Secondary | ICD-10-CM | POA: Insufficient documentation

## 2020-12-08 DIAGNOSIS — Z79899 Other long term (current) drug therapy: Secondary | ICD-10-CM | POA: Insufficient documentation

## 2020-12-08 DIAGNOSIS — Z7982 Long term (current) use of aspirin: Secondary | ICD-10-CM | POA: Insufficient documentation

## 2020-12-08 DIAGNOSIS — E119 Type 2 diabetes mellitus without complications: Secondary | ICD-10-CM | POA: Diagnosis not present

## 2020-12-08 DIAGNOSIS — Z87891 Personal history of nicotine dependence: Secondary | ICD-10-CM | POA: Diagnosis not present

## 2020-12-08 DIAGNOSIS — Z7984 Long term (current) use of oral hypoglycemic drugs: Secondary | ICD-10-CM | POA: Diagnosis not present

## 2020-12-08 DIAGNOSIS — Z794 Long term (current) use of insulin: Secondary | ICD-10-CM | POA: Insufficient documentation

## 2020-12-08 DIAGNOSIS — E1165 Type 2 diabetes mellitus with hyperglycemia: Secondary | ICD-10-CM | POA: Diagnosis not present

## 2020-12-08 LAB — CBG MONITORING, ED: Glucose-Capillary: 177 mg/dL — ABNORMAL HIGH (ref 70–99)

## 2020-12-08 NOTE — ED Provider Notes (Signed)
[facility] Emergency Department Provider Note  ____________________________________________  Time seen: Approximately 11:07 PM  I have reviewed the triage vital signs and the nursing notes.   HISTORY  Chief Complaint Hyperglycemia    HPI John Perez is a 80 y.o. male who presents the emergency department over concerns about blood sugar and insulin use.  Patient has been well maintained on his regimen of metformin plus Lantus.  Patient states that tonight he is unsure whether he actually took his Lantus.  He felt needed, then he had concerns that he may not have taken his Lantus.  He arrives for evaluation and counseling on regards to whether he should take his Lantus.  Patient takes Lantus once daily at nighttime and only takes 7 units.  Patient has no symptoms at this time.  He states that postprandial who will typically have a blood sugar in the 150s to 170s and the blood sugar reading was 174 prior to arrival.       Past Medical History:  Diagnosis Date   Diabetes mellitus type 2, insulin dependent (HCC)    HOH (hard of hearing)    Hypercholesteremia    Hypertension     There are no problems to display for this patient.   Past Surgical History:  Procedure Laterality Date   CATARACT EXTRACTION W/PHACO Left 02/25/2020   Procedure: CATARACT EXTRACTION PHACO AND INTRAOCULAR LENS PLACEMENT ([place]) LEFT DIABETIC;  Surgeon: Eulogio Bear, MD;  Location: [place];  Service: Ophthalmology;  Laterality: Left;  4.98 0:39.4   CATARACT EXTRACTION W/PHACO Right 03/17/2020   Procedure: CATARACT EXTRACTION PHACO AND INTRAOCULAR LENS PLACEMENT ([place]) RIGHT DIABETIC;  Surgeon: Eulogio Bear, MD;  Location: [place];  Service: Ophthalmology;  Laterality: Right;  6.33 0:55.3   GUM SURGERY     HERNIA REPAIR     x2    Prior to Admission medications   Medication Sig Start Date End Date Taking? Authorizing Provider  ASPIRIN 81 PO Take by  mouth daily.    [provider]  benazepril (LOTENSIN) 20 MG tablet Take 20 mg by mouth daily.    [provider]  finasteride (PROSCAR) 5 MG tablet Take 5 mg by mouth daily.    [provider]  insulin glargine (LANTUS) 100 UNIT/ML injection Inject 16 Units into the skin daily. evening    [provider]  MELATONIN PO Take by mouth at bedtime as needed.    [provider]  metFORMIN (GLUMETZA) 500 MG (MOD) 24 hr tablet Take 500 mg by mouth daily. With supper    [provider]  Multiple Vitamin (MULTIVITAMIN PO) Take by mouth daily.    [provider]  Omega-3 Fatty Acids (FISH OIL PO) Take by mouth daily.    [provider]  pravastatin (PRAVACHOL) 20 MG tablet Take 20 mg by mouth daily.    [provider]  tamsulosin (FLOMAX) 0.4 MG CAPS capsule Take 0.4 mg by mouth.    [provider]    Allergies Patient has no known allergies.  No family history on file.  Social History Social History   Tobacco Use   Smoking status: Former    Types: Cigarettes    Quit date: 1990    Years since quitting: 32.6   Smokeless tobacco: Never  Vaping Use   Vaping Use: Never used  Substance Use Topics   Alcohol use: Not Currently     Review of Systems  Constitutional: No fever/chills Eyes: No  visual changes. No discharge ENT: No upper respiratory complaints. Cardiovascular: no chest pain. Respiratory: no cough. No SOB. Gastrointestinal: No abdominal pain.  No nausea, no vomiting.  No diarrhea.  No constipation. Musculoskeletal: Negative for musculoskeletal pain. Skin: Negative for rash, abrasions, lacerations, ecchymosis. Neurological: Negative for headaches, focal weakness or numbness.  10 System ROS otherwise negative.  ____________________________________________   PHYSICAL EXAM:  VITAL SIGNS: ED Triage Vitals  Enc Vitals Group     BP 12/08/20 2133 (!) 160/111     Pulse Rate 12/08/20 2133  73     Resp 12/08/20 2133 20     Temp 12/08/20 2133 98.4 F (36.9 C)     Temp Source 12/08/20 2133 Oral     SpO2 12/08/20 2133 100 %     Weight 12/08/20 2134 138 lb (62.6 kg)     Height 12/08/20 2134 5\' 9"  (1.753 m)     Head Circumference --      Peak Flow --      Pain Score 12/08/20 2134 0     Pain Loc --      Pain Edu? --      Excl. in [place]? --      Constitutional: Alert and oriented. Well appearing and in no acute distress. Eyes: Conjunctivae are normal. PERRL. EOMI. Head: Atraumatic. ENT:      Ears:       Nose: No congestion/rhinnorhea.      Mouth/Throat: Mucous membranes are moist.  Neck: No stridor.    Cardiovascular: Normal rate, regular rhythm. Normal S1 and S2.  Good peripheral circulation. Respiratory: Normal respiratory effort without tachypnea or retractions. Lungs CTAB. Good air entry to the bases with no decreased or absent breath sounds. Musculoskeletal: Full range of motion to all extremities. No gross deformities appreciated. Neurologic:  Normal speech and language. No gross focal neurologic deficits are appreciated.  Skin:  Skin is warm, dry and intact. No rash noted. Psychiatric: Mood and affect are normal. Speech and behavior are normal. Patient exhibits appropriate insight and judgement.   ____________________________________________   LABS (all labs ordered are listed, but only abnormal results are displayed)  Labs Reviewed  CBG MONITORING, ED - Abnormal; Notable for the following components:      Result Value   Glucose-Capillary 177 (*)    All other components within normal limits   ____________________________________________  EKG   ____________________________________________  RADIOLOGY   No results found.  ____________________________________________    PROCEDURES  Procedure(s) performed:    Procedures    Medications - No data to display   ____________________________________________   INITIAL IMPRESSION / ASSESSMENT  AND PLAN / ED COURSE  Pertinent labs & imaging results that were available during my care of the patient were reviewed by me and considered in my medical decision making (see chart for details).  Review of the [place] CSRS was performed in accordance of the [place] prior to dispensing any controlled drugs.           Patient's diagnosis is consistent with diabetes with question about insulin use.  Patient presented to the emergency department unsure whether he took his Lantus this evening.  Patient is a type II diabetic with insulin use.  He takes long-acting Lantus once daily at nighttime.  He only takes 7 units but states that he is unsure whether he actually took his insulin or not.  Patient states that his blood sugar is typical for postprandial check he has no symptoms currently.  Patient had no elevation in  his blood sugar when compared to normal.  He has no symptoms at this time.  Blood sugar was 177 on arrival.  We discussed at length patient's options.  Given the fact that this is in the range to be expected for him typically, I do not feel that he needs to take a full dose of his Lantus.  He has been on 16 units up till recently at which time his Lantus was lowered due to well-maintained A1c's.  I discussed the possibility of taking a half dose of Lantus tonight versus waiting till morning to check his blood sugar at which time he can take half dose of Lantus if it was elevated.  Patient opts to take a dose tonight, states that he wake up at least once during the night to check his blood sugar.  This is a reasonable course.  No indication for further work-up.  Return precautions discussed with the patient. Patient is given ED precautions to return to the ED for any worsening or new symptoms.     ____________________________________________  FINAL CLINICAL IMPRESSION(S) / ED DIAGNOSES  Final diagnoses:  Type 2 diabetes mellitus without complication, with long-term current use of insulin (HCC)       NEW MEDICATIONS STARTED DURING THIS VISIT:  ED Discharge Orders     None           This chart was dictated using voice recognition software/Dragon. Despite best efforts to proofread, errors can occur which can change the meaning. Any change was purely unintentional.    Darletta Moll, PA-C 12/08/20 2314    Nance Pear, MD 12/08/20 [phone]

## 2020-12-08 NOTE — ED Notes (Signed)
Pt NAD, a/ox4. Pt verbalizes understanding of all DC and f/u instructions. All questions answered. Pt walks with steady gait to lobby at DC with spouse at side.

## 2020-12-08 NOTE — ED Triage Notes (Signed)
Pt here tonight because he is unsure if he gave his dose of insulin tonight.   Pt did not want to double dose. No n/v/d  denies any pain.  Pt alert  speech clear.   Fsbs in triage 177

## 2021-01-01 DIAGNOSIS — U071 COVID-19: Secondary | ICD-10-CM | POA: Diagnosis not present

## 2021-01-01 DIAGNOSIS — I10 Essential (primary) hypertension: Secondary | ICD-10-CM | POA: Diagnosis not present

## 2021-01-01 DIAGNOSIS — E119 Type 2 diabetes mellitus without complications: Secondary | ICD-10-CM | POA: Diagnosis not present

## 2021-01-06 DIAGNOSIS — Z794 Long term (current) use of insulin: Secondary | ICD-10-CM | POA: Diagnosis not present

## 2021-01-06 DIAGNOSIS — E1165 Type 2 diabetes mellitus with hyperglycemia: Secondary | ICD-10-CM | POA: Diagnosis not present

## 2021-01-06 DIAGNOSIS — I10 Essential (primary) hypertension: Secondary | ICD-10-CM | POA: Diagnosis not present

## 2021-02-23 DIAGNOSIS — E1165 Type 2 diabetes mellitus with hyperglycemia: Secondary | ICD-10-CM | POA: Diagnosis not present

## 2021-04-21 DIAGNOSIS — E1165 Type 2 diabetes mellitus with hyperglycemia: Secondary | ICD-10-CM | POA: Diagnosis not present

## 2021-04-21 DIAGNOSIS — Z794 Long term (current) use of insulin: Secondary | ICD-10-CM | POA: Diagnosis not present

## 2021-05-18 DIAGNOSIS — E1165 Type 2 diabetes mellitus with hyperglycemia: Secondary | ICD-10-CM | POA: Diagnosis not present

## 2021-05-27 DIAGNOSIS — H35371 Puckering of macula, right eye: Secondary | ICD-10-CM | POA: Diagnosis not present

## 2021-05-29 DIAGNOSIS — Z79899 Other long term (current) drug therapy: Secondary | ICD-10-CM | POA: Diagnosis not present

## 2021-05-29 DIAGNOSIS — N182 Chronic kidney disease, stage 2 (mild): Secondary | ICD-10-CM | POA: Diagnosis not present

## 2021-05-29 DIAGNOSIS — E1122 Type 2 diabetes mellitus with diabetic chronic kidney disease: Secondary | ICD-10-CM | POA: Diagnosis not present

## 2021-05-29 DIAGNOSIS — E7800 Pure hypercholesterolemia, unspecified: Secondary | ICD-10-CM | POA: Diagnosis not present

## 2021-05-29 DIAGNOSIS — I129 Hypertensive chronic kidney disease with stage 1 through stage 4 chronic kidney disease, or unspecified chronic kidney disease: Secondary | ICD-10-CM | POA: Diagnosis not present

## 2021-06-05 DIAGNOSIS — F329 Major depressive disorder, single episode, unspecified: Secondary | ICD-10-CM | POA: Diagnosis not present

## 2021-06-05 DIAGNOSIS — E785 Hyperlipidemia, unspecified: Secondary | ICD-10-CM | POA: Diagnosis not present

## 2021-06-05 DIAGNOSIS — E119 Type 2 diabetes mellitus without complications: Secondary | ICD-10-CM | POA: Diagnosis not present

## 2021-06-05 DIAGNOSIS — I10 Essential (primary) hypertension: Secondary | ICD-10-CM | POA: Diagnosis not present

## 2021-06-09 ENCOUNTER — Encounter: Payer: Self-pay | Admitting: Emergency Medicine

## 2021-06-09 ENCOUNTER — Other Ambulatory Visit: Payer: Self-pay

## 2021-06-09 ENCOUNTER — Emergency Department: Payer: Medicare HMO

## 2021-06-09 ENCOUNTER — Emergency Department
Admission: EM | Admit: 2021-06-09 | Discharge: 2021-06-09 | Disposition: A | Discharge status: Home / Self Care | Payer: Medicare HMO | Source: Home / Self Care | Attending: Emergency Medicine | Admitting: Emergency Medicine

## 2021-06-09 DIAGNOSIS — I639 Cerebral infarction, unspecified: Secondary | ICD-10-CM | POA: Diagnosis not present

## 2021-06-09 DIAGNOSIS — W109XXA Fall (on) (from) unspecified stairs and steps, initial encounter: Secondary | ICD-10-CM | POA: Diagnosis present

## 2021-06-09 DIAGNOSIS — R911 Solitary pulmonary nodule: Secondary | ICD-10-CM | POA: Diagnosis not present

## 2021-06-09 DIAGNOSIS — E119 Type 2 diabetes mellitus without complications: Secondary | ICD-10-CM | POA: Diagnosis not present

## 2021-06-09 DIAGNOSIS — R9089 Other abnormal findings on diagnostic imaging of central nervous system: Secondary | ICD-10-CM | POA: Diagnosis not present

## 2021-06-09 DIAGNOSIS — E861 Hypovolemia: Secondary | ICD-10-CM | POA: Diagnosis present

## 2021-06-09 DIAGNOSIS — S0990XA Unspecified injury of head, initial encounter: Secondary | ICD-10-CM | POA: Diagnosis present

## 2021-06-09 DIAGNOSIS — J341 Cyst and mucocele of nose and nasal sinus: Secondary | ICD-10-CM | POA: Diagnosis not present

## 2021-06-09 DIAGNOSIS — R631 Polydipsia: Secondary | ICD-10-CM | POA: Diagnosis present

## 2021-06-09 DIAGNOSIS — R4182 Altered mental status, unspecified: Secondary | ICD-10-CM | POA: Diagnosis not present

## 2021-06-09 DIAGNOSIS — M47812 Spondylosis without myelopathy or radiculopathy, cervical region: Secondary | ICD-10-CM | POA: Diagnosis not present

## 2021-06-09 DIAGNOSIS — R7989 Other specified abnormal findings of blood chemistry: Secondary | ICD-10-CM | POA: Diagnosis present

## 2021-06-09 DIAGNOSIS — I10 Essential (primary) hypertension: Secondary | ICD-10-CM | POA: Diagnosis not present

## 2021-06-09 DIAGNOSIS — H9190 Unspecified hearing loss, unspecified ear: Secondary | ICD-10-CM | POA: Diagnosis present

## 2021-06-09 DIAGNOSIS — Z87891 Personal history of nicotine dependence: Secondary | ICD-10-CM | POA: Diagnosis not present

## 2021-06-09 DIAGNOSIS — E871 Hypo-osmolality and hyponatremia: Secondary | ICD-10-CM | POA: Diagnosis present

## 2021-06-09 DIAGNOSIS — Z7982 Long term (current) use of aspirin: Secondary | ICD-10-CM | POA: Diagnosis not present

## 2021-06-09 DIAGNOSIS — D649 Anemia, unspecified: Secondary | ICD-10-CM | POA: Diagnosis present

## 2021-06-09 DIAGNOSIS — Z79899 Other long term (current) drug therapy: Secondary | ICD-10-CM | POA: Diagnosis not present

## 2021-06-09 DIAGNOSIS — I129 Hypertensive chronic kidney disease with stage 1 through stage 4 chronic kidney disease, or unspecified chronic kidney disease: Secondary | ICD-10-CM | POA: Diagnosis present

## 2021-06-09 DIAGNOSIS — Y92009 Unspecified place in unspecified non-institutional (private) residence as the place of occurrence of the external cause: Secondary | ICD-10-CM | POA: Diagnosis not present

## 2021-06-09 DIAGNOSIS — I6523 Occlusion and stenosis of bilateral carotid arteries: Secondary | ICD-10-CM | POA: Diagnosis not present

## 2021-06-09 DIAGNOSIS — S199XXA Unspecified injury of neck, initial encounter: Secondary | ICD-10-CM | POA: Diagnosis not present

## 2021-06-09 DIAGNOSIS — J329 Chronic sinusitis, unspecified: Secondary | ICD-10-CM | POA: Diagnosis not present

## 2021-06-09 DIAGNOSIS — Z961 Presence of intraocular lens: Secondary | ICD-10-CM | POA: Diagnosis present

## 2021-06-09 DIAGNOSIS — R918 Other nonspecific abnormal finding of lung field: Secondary | ICD-10-CM | POA: Diagnosis not present

## 2021-06-09 DIAGNOSIS — Z794 Long term (current) use of insulin: Secondary | ICD-10-CM | POA: Diagnosis not present

## 2021-06-09 DIAGNOSIS — R590 Localized enlarged lymph nodes: Secondary | ICD-10-CM | POA: Diagnosis present

## 2021-06-09 DIAGNOSIS — I700 Atherosclerosis of aorta: Secondary | ICD-10-CM | POA: Diagnosis not present

## 2021-06-09 DIAGNOSIS — D631 Anemia in chronic kidney disease: Secondary | ICD-10-CM | POA: Diagnosis not present

## 2021-06-09 DIAGNOSIS — N3000 Acute cystitis without hematuria: Secondary | ICD-10-CM | POA: Diagnosis not present

## 2021-06-09 DIAGNOSIS — Y9301 Activity, walking, marching and hiking: Secondary | ICD-10-CM | POA: Diagnosis present

## 2021-06-09 DIAGNOSIS — I619 Nontraumatic intracerebral hemorrhage, unspecified: Secondary | ICD-10-CM | POA: Diagnosis not present

## 2021-06-09 DIAGNOSIS — S40011A Contusion of right shoulder, initial encounter: Secondary | ICD-10-CM | POA: Insufficient documentation

## 2021-06-09 DIAGNOSIS — N390 Urinary tract infection, site not specified: Secondary | ICD-10-CM | POA: Diagnosis present

## 2021-06-09 DIAGNOSIS — N181 Chronic kidney disease, stage 1: Secondary | ICD-10-CM | POA: Diagnosis present

## 2021-06-09 DIAGNOSIS — J9811 Atelectasis: Secondary | ICD-10-CM | POA: Diagnosis not present

## 2021-06-09 DIAGNOSIS — G9341 Metabolic encephalopathy: Secondary | ICD-10-CM | POA: Diagnosis present

## 2021-06-09 DIAGNOSIS — Z20822 Contact with and (suspected) exposure to covid-19: Secondary | ICD-10-CM | POA: Diagnosis present

## 2021-06-09 DIAGNOSIS — N182 Chronic kidney disease, stage 2 (mild): Secondary | ICD-10-CM | POA: Diagnosis not present

## 2021-06-09 DIAGNOSIS — J90 Pleural effusion, not elsewhere classified: Secondary | ICD-10-CM | POA: Diagnosis not present

## 2021-06-09 DIAGNOSIS — F32A Depression, unspecified: Secondary | ICD-10-CM | POA: Diagnosis present

## 2021-06-09 DIAGNOSIS — I739 Peripheral vascular disease, unspecified: Secondary | ICD-10-CM | POA: Diagnosis not present

## 2021-06-09 DIAGNOSIS — E1122 Type 2 diabetes mellitus with diabetic chronic kidney disease: Secondary | ICD-10-CM | POA: Diagnosis present

## 2021-06-09 DIAGNOSIS — Z9842 Cataract extraction status, left eye: Secondary | ICD-10-CM | POA: Diagnosis not present

## 2021-06-09 DIAGNOSIS — E222 Syndrome of inappropriate secretion of antidiuretic hormone: Secondary | ICD-10-CM | POA: Diagnosis present

## 2021-06-09 DIAGNOSIS — M25511 Pain in right shoulder: Secondary | ICD-10-CM | POA: Diagnosis not present

## 2021-06-09 DIAGNOSIS — Z7984 Long term (current) use of oral hypoglycemic drugs: Secondary | ICD-10-CM | POA: Diagnosis not present

## 2021-06-09 DIAGNOSIS — C719 Malignant neoplasm of brain, unspecified: Secondary | ICD-10-CM | POA: Diagnosis not present

## 2021-06-09 DIAGNOSIS — R410 Disorientation, unspecified: Secondary | ICD-10-CM | POA: Diagnosis not present

## 2021-06-09 DIAGNOSIS — E7800 Pure hypercholesterolemia, unspecified: Secondary | ICD-10-CM | POA: Diagnosis present

## 2021-06-09 DIAGNOSIS — Z9841 Cataract extraction status, right eye: Secondary | ICD-10-CM | POA: Diagnosis not present

## 2021-06-09 MED ORDER — TRAMADOL HCL 50 MG PO TABS: 50.0000 mg | ORAL_TABLET | Freq: Four times a day (QID) | ORAL | 0 refills | Status: DC | PRN

## 2021-06-09 NOTE — ED Triage Notes (Signed)
Pt reports that he fell backward coming down the steps he fell down approx 4 brick steps and landed in the grass. He reports that he just lost his balance. He is not on blood thinners but did hit his head. He is also complaining of right shoulder pain

## 2021-06-09 NOTE — Discharge Instructions (Addendum)
The MRI shows an area of what is called abnormal T2 hyperintensity in the left parietal lobe, which sometimes can represent a low grade mass or tumor.  Radiology recommends a repeat MRI with contrast to be performed within approximately 6 weeks.  Contact Dr. Elzie Rings office to let him know about this finding so it can be scheduled.

## 2021-06-09 NOTE — ED Provider Notes (Signed)
[facility] Provider Note    Event Date/Time   First MD Initiated Contact with Patient 06/09/21 1901     (approximate)   History   Fall   HPI  John Perez is a 81 y.o. male on aspirin but no anticoagulation presents after a mechanical fall while walking down several steps.  The patient states that he believes he lost his balance and fell forward.  He hit his head and right shoulder on the ground going down about 4 steps.  He denies LOC.  He has had no significant headache, nausea, or vomiting since the injury.  He primarily reports right shoulder pain although he is able to move the shoulder relatively well.  He denies any neck or back pain.  He denies chest pain or difficulty breathing.  He did not get dizzy or presyncopal before the fall.      Physical Exam   Triage Vital Signs: ED Triage Vitals  Enc Vitals Group     BP 06/09/21 1729 (!) 165/103     Pulse Rate 06/09/21 1729 78     Resp 06/09/21 1729 20     Temp 06/09/21 1729 98.5 F (36.9 C)     Temp src --      SpO2 06/09/21 1729 97 %     Weight 06/09/21 1730 135 lb (61.2 kg)     Height 06/09/21 1730 5\' 9"  (1.753 m)     Head Circumference --      Peak Flow --      Pain Score 06/09/21 1730 5     Pain Loc --      Pain Edu? --      Excl. in [place]? --     Most recent vital signs: Vitals:   06/09/21 1729 06/09/21 2326  BP: (!) 165/103 (!) 150/87  Pulse: 78 75  Resp: 20 20  Temp: 98.5 F (36.9 C)   SpO2: 97% 98%     General: Alert and oriented, comfortable appearing. CV:  Good peripheral perfusion.  Resp:  Normal effort.  Abd:  No distention.  Other:  No midline cervical or other spinal tenderness.  EOMI.  PERRLA.  Cranial nerves III through XII grossly intact.  Motor and sensory intact in all extremities.  No pronator drift.  Normal coordination.  FROM R shoulder with mild pain.   ED Results / Procedures / Treatments   Labs (all labs ordered are listed, but only abnormal results are  displayed) Labs Reviewed - No data to display   EKG  ED ECG REPORT I, Arta Silence, the attending physician, personally viewed and interpreted this ECG.  Date: 06/09/2021 EKG Time: 1735 Rate: 75 Rhythm: normal sinus rhythm QRS Axis: normal Intervals: normal ST/T Wave abnormalities: LVH with repolarization abnormality Narrative Interpretation: no evidence of acute ischemia; no prior EKG available for comparison    RADIOLOGY  XR L shoulder: I independently viewed and interpreted the images.  There is no evidence of acute fracture or dislocation.  CT head: I independently viewed and interpreted the images; there is no evidence of ICH or other traumatic finding  CT cervical spine: I independently viewed and interpreted the images; there is no acute fracture    PROCEDURES:  Critical Care performed: No  Procedures   MEDICATIONS ORDERED IN ED: Medications - No data to display   IMPRESSION / MDM / [place] / ED COURSE  I reviewed the triage vital signs and the nursing notes.  81 year old male with  history of diabetes, hypertension, high cholesterol presents with head and right shoulder injury after mechanical fall down a few steps.  He had no LOC.  On exam the patient is overall well-appearing.  His vital signs are normal except for hypertension.  Neurologic exam is normal.  There is no visible trauma.  The patient is able to move the right shoulder well.  CT head, cervical spine, and XR of the right shoulder are all negative for acute traumatic findings.  Injury is consistent with mechanical fall and there is no indication for lab work-up.  However, the CT head showed an area in the left parietal lobe which could be a recent or subacute stroke.  On further discussion with the patient and family, although his neurologic exam is currently nonfocal, he has had some difficulty with gait and balance recently and they were somewhat concerned that he could have  had a small stroke.  Therefore, we will obtain an MRI for further evaluation.  In addition, the x-ray of the right shoulder shows mild prominence of the right border of the ascending aorta which could indicate an aneurysm.  However, at this time, the patient has no chest pain, difficulty breathing, severe hypertension or tachycardia, or other symptoms to suggest aortic dissection.  Therefore there is no indication for emergent work-up.  I discussed the x-ray findings with the patient and family and offered a CT done in the ED, versus having patient follow-up and have further imaging done as an outpatient.  They declined CT today.  ----------------------------------------- 11:42 PM on 06/09/2021 -----------------------------------------  MR brain shows no evidence of acute or recent CVA, however there is an area of abnormal T2 weighted signal in the left parietal lobe concern for low-grade neoplasm.  Radiology recommends repeat MRI with contrast in 6-12 weeks.  I informed the patient and family about this finding and the need for followup.  Otherwise the patient is appropriate for discharge home, and feels well.  Return precautions given, and the patient and family expressed understanding.    FINAL CLINICAL IMPRESSION(S) / ED DIAGNOSES   Final diagnoses:  Contusion of right shoulder, initial encounter  Minor head injury, initial encounter     Rx / DC Orders   ED Discharge Orders          Ordered    traMADol (ULTRAM) 50 MG tablet  Every 6 hours PRN        06/09/21 2342             Note:  This document was prepared using Dragon voice recognition software and may include unintentional dictation errors.    Arta Silence, MD 06/09/21 [phone]

## 2021-06-09 NOTE — ED Notes (Signed)
See triage note  presents with s/p fall states lost his balance  states he was coming down the steps  fell backwards  having pain to right shoulder.

## 2021-06-10 ENCOUNTER — Other Ambulatory Visit: Payer: Self-pay

## 2021-06-10 ENCOUNTER — Inpatient Hospital Stay
Admission: EM | Admit: 2021-06-10 | Discharge: 2021-06-17 | DRG: 643 | Disposition: A | Discharge status: Home / Self Care | Payer: Medicare HMO | Attending: Internal Medicine | Admitting: Internal Medicine

## 2021-06-10 ENCOUNTER — Emergency Department: Payer: Medicare HMO

## 2021-06-10 DIAGNOSIS — R7989 Other specified abnormal findings of blood chemistry: Secondary | ICD-10-CM | POA: Diagnosis present

## 2021-06-10 DIAGNOSIS — R4587 Impulsiveness: Secondary | ICD-10-CM | POA: Diagnosis present

## 2021-06-10 DIAGNOSIS — Z794 Long term (current) use of insulin: Secondary | ICD-10-CM | POA: Diagnosis not present

## 2021-06-10 DIAGNOSIS — Z79899 Other long term (current) drug therapy: Secondary | ICD-10-CM

## 2021-06-10 DIAGNOSIS — E861 Hypovolemia: Secondary | ICD-10-CM | POA: Diagnosis present

## 2021-06-10 DIAGNOSIS — E119 Type 2 diabetes mellitus without complications: Secondary | ICD-10-CM | POA: Diagnosis not present

## 2021-06-10 DIAGNOSIS — Z87891 Personal history of nicotine dependence: Secondary | ICD-10-CM | POA: Diagnosis not present

## 2021-06-10 DIAGNOSIS — W109XXA Fall (on) (from) unspecified stairs and steps, initial encounter: Secondary | ICD-10-CM | POA: Diagnosis present

## 2021-06-10 DIAGNOSIS — F419 Anxiety disorder, unspecified: Secondary | ICD-10-CM | POA: Diagnosis present

## 2021-06-10 DIAGNOSIS — Y9301 Activity, walking, marching and hiking: Secondary | ICD-10-CM | POA: Diagnosis present

## 2021-06-10 DIAGNOSIS — Z20822 Contact with and (suspected) exposure to covid-19: Secondary | ICD-10-CM | POA: Diagnosis present

## 2021-06-10 DIAGNOSIS — Z7982 Long term (current) use of aspirin: Secondary | ICD-10-CM

## 2021-06-10 DIAGNOSIS — R631 Polydipsia: Secondary | ICD-10-CM | POA: Diagnosis present

## 2021-06-10 DIAGNOSIS — E222 Syndrome of inappropriate secretion of antidiuretic hormone: Secondary | ICD-10-CM | POA: Diagnosis not present

## 2021-06-10 DIAGNOSIS — E1122 Type 2 diabetes mellitus with diabetic chronic kidney disease: Secondary | ICD-10-CM | POA: Diagnosis not present

## 2021-06-10 DIAGNOSIS — N181 Chronic kidney disease, stage 1: Secondary | ICD-10-CM | POA: Diagnosis present

## 2021-06-10 DIAGNOSIS — R410 Disorientation, unspecified: Secondary | ICD-10-CM | POA: Diagnosis not present

## 2021-06-10 DIAGNOSIS — S0990XA Unspecified injury of head, initial encounter: Secondary | ICD-10-CM | POA: Diagnosis present

## 2021-06-10 DIAGNOSIS — H9190 Unspecified hearing loss, unspecified ear: Secondary | ICD-10-CM | POA: Diagnosis present

## 2021-06-10 DIAGNOSIS — N390 Urinary tract infection, site not specified: Secondary | ICD-10-CM | POA: Diagnosis present

## 2021-06-10 DIAGNOSIS — D631 Anemia in chronic kidney disease: Secondary | ICD-10-CM | POA: Diagnosis not present

## 2021-06-10 DIAGNOSIS — R590 Localized enlarged lymph nodes: Secondary | ICD-10-CM | POA: Diagnosis present

## 2021-06-10 DIAGNOSIS — N182 Chronic kidney disease, stage 2 (mild): Secondary | ICD-10-CM | POA: Diagnosis not present

## 2021-06-10 DIAGNOSIS — G9341 Metabolic encephalopathy: Secondary | ICD-10-CM | POA: Diagnosis present

## 2021-06-10 DIAGNOSIS — F32A Depression, unspecified: Secondary | ICD-10-CM | POA: Diagnosis present

## 2021-06-10 DIAGNOSIS — R911 Solitary pulmonary nodule: Secondary | ICD-10-CM | POA: Diagnosis not present

## 2021-06-10 DIAGNOSIS — Z9842 Cataract extraction status, left eye: Secondary | ICD-10-CM | POA: Diagnosis not present

## 2021-06-10 DIAGNOSIS — E871 Hypo-osmolality and hyponatremia: Secondary | ICD-10-CM | POA: Diagnosis present

## 2021-06-10 DIAGNOSIS — J90 Pleural effusion, not elsewhere classified: Secondary | ICD-10-CM | POA: Diagnosis not present

## 2021-06-10 DIAGNOSIS — R2681 Unsteadiness on feet: Secondary | ICD-10-CM | POA: Diagnosis present

## 2021-06-10 DIAGNOSIS — Y92009 Unspecified place in unspecified non-institutional (private) residence as the place of occurrence of the external cause: Secondary | ICD-10-CM | POA: Diagnosis not present

## 2021-06-10 DIAGNOSIS — C719 Malignant neoplasm of brain, unspecified: Secondary | ICD-10-CM | POA: Diagnosis not present

## 2021-06-10 DIAGNOSIS — R4182 Altered mental status, unspecified: Secondary | ICD-10-CM

## 2021-06-10 DIAGNOSIS — Z961 Presence of intraocular lens: Secondary | ICD-10-CM | POA: Diagnosis present

## 2021-06-10 DIAGNOSIS — Z7984 Long term (current) use of oral hypoglycemic drugs: Secondary | ICD-10-CM

## 2021-06-10 DIAGNOSIS — Z9841 Cataract extraction status, right eye: Secondary | ICD-10-CM | POA: Diagnosis not present

## 2021-06-10 DIAGNOSIS — D649 Anemia, unspecified: Secondary | ICD-10-CM | POA: Diagnosis present

## 2021-06-10 DIAGNOSIS — I129 Hypertensive chronic kidney disease with stage 1 through stage 4 chronic kidney disease, or unspecified chronic kidney disease: Secondary | ICD-10-CM | POA: Diagnosis present

## 2021-06-10 DIAGNOSIS — S40011A Contusion of right shoulder, initial encounter: Secondary | ICD-10-CM | POA: Diagnosis present

## 2021-06-10 DIAGNOSIS — R9089 Other abnormal findings on diagnostic imaging of central nervous system: Secondary | ICD-10-CM | POA: Diagnosis not present

## 2021-06-10 DIAGNOSIS — N3000 Acute cystitis without hematuria: Secondary | ICD-10-CM | POA: Diagnosis not present

## 2021-06-10 DIAGNOSIS — I700 Atherosclerosis of aorta: Secondary | ICD-10-CM | POA: Diagnosis not present

## 2021-06-10 DIAGNOSIS — W010XXA Fall on same level from slipping, tripping and stumbling without subsequent striking against object, initial encounter: Secondary | ICD-10-CM | POA: Diagnosis present

## 2021-06-10 DIAGNOSIS — E7800 Pure hypercholesterolemia, unspecified: Secondary | ICD-10-CM | POA: Diagnosis present

## 2021-06-10 DIAGNOSIS — R918 Other nonspecific abnormal finding of lung field: Secondary | ICD-10-CM | POA: Diagnosis not present

## 2021-06-10 DIAGNOSIS — I10 Essential (primary) hypertension: Secondary | ICD-10-CM | POA: Diagnosis not present

## 2021-06-10 DIAGNOSIS — J9811 Atelectasis: Secondary | ICD-10-CM | POA: Diagnosis not present

## 2021-06-10 DIAGNOSIS — I6523 Occlusion and stenosis of bilateral carotid arteries: Secondary | ICD-10-CM | POA: Diagnosis not present

## 2021-06-10 DIAGNOSIS — I619 Nontraumatic intracerebral hemorrhage, unspecified: Secondary | ICD-10-CM | POA: Diagnosis not present

## 2021-06-10 LAB — COMPREHENSIVE METABOLIC PANEL
ALT: 52 U/L — ABNORMAL HIGH (ref 0–44)
ALT: 43 U/L (ref 0–44)
AST: 106 U/L — ABNORMAL HIGH (ref 15–41)
AST: 95 U/L — ABNORMAL HIGH (ref 15–41)
Albumin: 3.7 g/dL (ref 3.5–5.0)
Albumin: 3.1 g/dL — ABNORMAL LOW (ref 3.5–5.0)
Alkaline Phosphatase: 81 U/L (ref 38–126)
Alkaline Phosphatase: 65 U/L (ref 38–126)
Anion gap: 13 (ref 5–15)
Anion gap: 10 (ref 5–15)
BUN: 16 mg/dL (ref 8–23)
BUN: 13 mg/dL (ref 8–23)
CO2: 20 mmol/L — ABNORMAL LOW (ref 22–32)
CO2: 21 mmol/L — ABNORMAL LOW (ref 22–32)
Calcium: 8.6 mg/dL — ABNORMAL LOW (ref 8.9–10.3)
Calcium: 8 mg/dL — ABNORMAL LOW (ref 8.9–10.3)
Chloride: 79 mmol/L — ABNORMAL LOW (ref 98–111)
Chloride: 87 mmol/L — ABNORMAL LOW (ref 98–111)
Creatinine, Ser: 0.48 mg/dL — ABNORMAL LOW (ref 0.61–1.24)
Creatinine, Ser: 0.45 mg/dL — ABNORMAL LOW (ref 0.61–1.24)
GFR, Estimated: >60 mL/min (ref >60)
GFR, Estimated: >60 mL/min (ref >60)
Glucose, Bld: 107 mg/dL — ABNORMAL HIGH (ref 70–99)
Glucose, Bld: 87 mg/dL (ref 70–99)
Potassium: 3.9 mmol/L (ref 3.5–5.1)
Potassium: 3.6 mmol/L (ref 3.5–5.1)
Sodium: 112 mmol/L — CL (ref 135–145)
Sodium: 118 mmol/L — CL (ref 135–145)
Total Bilirubin: 1.4 mg/dL — ABNORMAL HIGH (ref 0.3–1.2)
Total Bilirubin: 0.9 mg/dL (ref 0.3–1.2)
Total Protein: 6.3 g/dL — ABNORMAL LOW (ref 6.5–8.1)
Total Protein: 5.3 g/dL — ABNORMAL LOW (ref 6.5–8.1)

## 2021-06-10 LAB — CREATININE, URINE, RANDOM: Creatinine, Urine: 79 mg/dL

## 2021-06-10 LAB — URINALYSIS, COMPLETE (UACMP) WITH MICROSCOPIC
Bilirubin Urine: NEGATIVE
Glucose, UA: NEGATIVE mg/dL
Ketones, ur: 5 mg/dL — AB
Nitrite: NEGATIVE
Protein, ur: 100 mg/dL — AB
Specific Gravity, Urine: 1.019 (ref 1.005–1.030)
Squamous Epithelial / HPF: NONE SEEN (ref 0–5)
WBC, UA: >50 WBC/hpf — ABNORMAL HIGH (ref 0–5)
pH: 5 (ref 5.0–8.0)

## 2021-06-10 LAB — CBC
HCT: 35.9 % — ABNORMAL LOW (ref 39.0–52.0)
Hemoglobin: 13.4 g/dL (ref 13.0–17.0)
MCH: 35 pg — ABNORMAL HIGH (ref 26.0–34.0)
MCHC: 37.3 g/dL — ABNORMAL HIGH (ref 30.0–36.0)
MCV: 93.7 fL (ref 80.0–100.0)
Platelets: 253 10*3/uL (ref 150–400)
RBC: 3.83 MIL/uL — ABNORMAL LOW (ref 4.22–5.81)
RDW: 11 % — ABNORMAL LOW (ref 11.5–15.5)
WBC: 14.8 10*3/uL — ABNORMAL HIGH (ref 4.0–10.5)
nRBC: 0 % (ref 0.0–0.2)

## 2021-06-10 LAB — SODIUM, URINE, RANDOM: Sodium, Ur: 12 mmol/L

## 2021-06-10 LAB — TSH: TSH: 2.08 u[IU]/mL (ref 0.350–4.500)

## 2021-06-10 LAB — RESP PANEL BY RT-PCR (FLU A&B, COVID) ARPGX2
Influenza A by PCR: NEGATIVE
Influenza B by PCR: NEGATIVE
SARS Coronavirus 2 by RT PCR: NEGATIVE

## 2021-06-10 LAB — CBG MONITORING, ED
Glucose-Capillary: 118 mg/dL — ABNORMAL HIGH (ref 70–99)
Glucose-Capillary: 96 mg/dL (ref 70–99)

## 2021-06-10 LAB — OSMOLALITY, URINE: Osmolality, Ur: 607 mOsm/kg (ref 300–900)

## 2021-06-10 LAB — OSMOLALITY: Osmolality: 247 mOsm/kg — CL (ref 275–295)

## 2021-06-10 LAB — SODIUM: Sodium: 113 mmol/L — CL (ref 135–145)

## 2021-06-10 MED ORDER — ACETAMINOPHEN 325 MG PO TABS
650.0000 mg | ORAL_TABLET | Freq: Four times a day (QID) | ORAL | Status: DC | PRN
  Administered 2021-06-13: 650 mg via ORAL
  Filled 2021-06-10: qty 2

## 2021-06-10 MED ORDER — INSULIN ASPART 100 UNIT/ML IJ SOLN
0.0000 [IU] | Freq: Three times a day (TID) | INTRAMUSCULAR | Status: DC
  Administered 2021-06-11: 3 [IU] via SUBCUTANEOUS
  Administered 2021-06-11: 1 [IU] via SUBCUTANEOUS
  Administered 2021-06-11 – 2021-06-12 (×3): 2 [IU] via SUBCUTANEOUS
  Administered 2021-06-12 – 2021-06-13 (×2): 3 [IU] via SUBCUTANEOUS
  Administered 2021-06-13: 1 [IU] via SUBCUTANEOUS
  Administered 2021-06-13 – 2021-06-14 (×3): 2 [IU] via SUBCUTANEOUS
  Administered 2021-06-14 – 2021-06-15 (×3): 1 [IU] via SUBCUTANEOUS
  Administered 2021-06-15: 2 [IU] via SUBCUTANEOUS
  Administered 2021-06-16: 1 [IU] via SUBCUTANEOUS
  Administered 2021-06-16 – 2021-06-17 (×3): 2 [IU] via SUBCUTANEOUS
  Filled 2021-06-10 (×18): qty 1

## 2021-06-10 MED ORDER — ENOXAPARIN SODIUM 40 MG/0.4ML IJ SOSY
40.0000 mg | PREFILLED_SYRINGE | Freq: Every day | INTRAMUSCULAR | Status: DC
  Administered 2021-06-11 – 2021-06-17 (×6): 40 mg via SUBCUTANEOUS
  Filled 2021-06-10 (×6): qty 0.4

## 2021-06-10 MED ORDER — SODIUM CHLORIDE 0.9 % IV SOLN
1.0000 g | INTRAVENOUS | Status: DC
  Administered 2021-06-10 – 2021-06-14 (×5): 1 g via INTRAVENOUS
  Filled 2021-06-10: qty 10
  Filled 2021-06-10 (×2): qty 1
  Filled 2021-06-10 (×2): qty 10
  Filled 2021-06-10: qty 1

## 2021-06-10 MED ORDER — ACETAMINOPHEN 650 MG RE SUPP: 650.0000 mg | Freq: Four times a day (QID) | RECTAL | Status: DC | PRN

## 2021-06-10 MED ORDER — SODIUM CHLORIDE 0.9% FLUSH
3.0000 mL | Freq: Two times a day (BID) | INTRAVENOUS | Status: DC
  Administered 2021-06-10 – 2021-06-12 (×4): 3 mL via INTRAVENOUS

## 2021-06-10 MED ORDER — SODIUM CHLORIDE 0.9 % IV SOLN: Freq: Once | INTRAVENOUS | Status: AC

## 2021-06-10 MED ORDER — SODIUM CHLORIDE 3 % IV SOLN
INTRAVENOUS | Status: DC
  Filled 2021-06-10 (×2): qty 500

## 2021-06-10 MED ORDER — INSULIN ASPART 100 UNIT/ML IJ SOLN: 0.0000 [IU] | Freq: Every day | INTRAMUSCULAR | Status: DC

## 2021-06-10 NOTE — ED Provider Notes (Addendum)
[facility] Emergency Department Provider Note     Event Date/Time   First MD Initiated Contact with Patient 06/10/21 1537     (approximate)   History   Altered Mental Status and Fall   HPI  Interim history provided by the patient's wife and adult daughter who are at bedside.  Patient is alert to person and place.  John Perez is a 81 y.o. male with a history of hypertension, diabetes type 2, and hyperlipidemia, presents to the ED via personal vehicle, for evaluation following an unwitnessed fall.  Patient was at home, when he apparently experienced an unwitnessed fall in his closet earlier this morning.  According to family, patient has been unsteady on his feet and impulsive since that time.  The wife would also endorse some change in patient's mentation over the last few days.  She  was concerned for abnormal CBG, but reported a home CBG reading of 70 and then a post-prandial reading of 111 NG/DL. It is unknown whether the patient had a closed head injury or episode of LOC related to the fall today.  Additional report from the family and the patient's wife, noted that the patient has had some increased confusion including some "babbling" speech.  According to chart review, patient had a routine visit with his primary provider in February, and was apparently started on Paxil 10 mg nightly for management of his anxiety and depression.  The wife reports he been on that medication for no more than a week at this time.  The wife denies any recent injury, illness, fevers, cough, congestion, diarrhea, or constipation.     Physical Exam   Triage Vital Signs: ED Triage Vitals  Enc Vitals Group     BP 06/10/21 1507 (!) 151/81     Pulse Rate 06/10/21 1507 80     Resp 06/10/21 1507 19     Temp 06/10/21 1511 98.6 F (37 C)     Temp Source 06/10/21 1511 Oral     SpO2 06/10/21 1507 100 %     Weight --      Height 06/10/21 1508 5\' 9"  (1.753 m)     Head Circumference  --      Peak Flow --      Pain Score --      Pain Loc --      Pain Edu? --      Excl. in [place]? --     Most recent vital signs: Vitals:   06/10/21 1600 06/10/21 1630  BP: (!) 154/75 (!) 145/72  Pulse: 70 69  Resp: 13 16  Temp:    SpO2: 98% 100%    General Awake, no distress. Alert to person and place.  HEENT NCAT. PERRL. No icterus. No rhinorrhea. Mucous membranes are moist.  CV:  Good peripheral perfusion. RRR. No peripheral edema RESP:  Normal effort. CTA ABD:  No distention. Soft, nondender   ED Results / Procedures / Treatments   Labs (all labs ordered are listed, but only abnormal results are displayed) Labs Reviewed  COMPREHENSIVE METABOLIC PANEL - Abnormal; Notable for the following components:      Result Value   Sodium 112 (*)    Chloride 79 (*)    CO2 20 (*)    Glucose, Bld 107 (*)    Creatinine, Ser 0.48 (*)    Calcium 8.6 (*)    Total Protein 6.3 (*)    AST 106 (*)    ALT 52 (*)  Total Bilirubin 1.4 (*)    All other components within normal limits  CBC - Abnormal; Notable for the following components:   WBC 14.8 (*)    RBC 3.83 (*)    HCT 35.9 (*)    MCH 35.0 (*)    MCHC 37.3 (*)    RDW 11.0 (*)    All other components within normal limits  URINALYSIS, COMPLETE (UACMP) WITH MICROSCOPIC - Abnormal; Notable for the following components:   Color, Urine YELLOW (*)    APPearance CLOUDY (*)    Hgb urine dipstick MODERATE (*)    Ketones, ur 5 (*)    Protein, ur 100 (*)    Leukocytes,Ua LARGE (*)    WBC, UA >50 (*)    Bacteria, UA MANY (*)    All other components within normal limits  CBG MONITORING, ED - Abnormal; Notable for the following components:   Glucose-Capillary 118 (*)    All other components within normal limits  RESP PANEL BY RT-PCR (FLU A&B, COVID) ARPGX2  URINE CULTURE  SODIUM  COMPREHENSIVE METABOLIC PANEL     EKG  Vent. rate 76 BPM PR interval 198 ms QRS duration 90 ms QT/QTcB 410/461 ms P-R-T axes 28 20 24  No  STEMI  RADIOLOGY  I personally viewed and evaluated these images as part of my medical decision making, as well as reviewing the written report by the radiologist.  ED Provider Interpretation: no acute findings}  DG Shoulder Right  Result Date: 06/09/2021 CLINICAL DATA:  Fall, right shoulder pain EXAM: RIGHT SHOULDER - 2+ VIEW COMPARISON:  None. FINDINGS: Expected glenohumeral alignment without visible fracture. Mild anterior glenoid spurring. Both frontal projections of the humerus have the humerus somewhat internally rotated. Atherosclerotic calcification of the aortic arch. Mild prominence of the right border of the ascending thoracic aorta is nonspecific, although can sometimes be associated with ascending thoracic aortic aneurysm. IMPRESSION: 1. No fracture, dislocation, or acute bony findings. 2.  Aortic Atherosclerosis (ICD10-I70.0). 3. Mild prominence of the right contour of the mediastinum is nonspecific, but ascending thoracic aortic aneurysm cannot be readily excluded. Electronically Signed   By: Van Clines M.D.   On: 06/09/2021 18:42   CT Head Wo Contrast  Result Date: 06/10/2021 CLINICAL DATA:  yesterday for a fall, family reports pt fell again this morning around [place] states since the pt has had increased confusion, not making any since, "babbling EXAM: CT HEAD WITHOUT CONTRAST TECHNIQUE: Contiguous axial images were obtained from the base of the skull through the vertex without intravenous contrast. RADIATION DOSE REDUCTION: This exam was performed according to the departmental dose-optimization program which includes automated exposure control, adjustment of the mA and/or kV according to patient size and/or use of iterative reconstruction technique. COMPARISON:  CT head 06/09/2021, MRI head 06/09/2021 BRAIN: BRAIN Cerebral ventricle sizes are concordant with the degree of cerebral volume loss. Similar-appearing left parietal lobe hypodensity along the white matter that is better  evaluated on MRI head 06/09/2021. Patchy and confluent areas of decreased attenuation are noted throughout the deep and periventricular white matter of the cerebral hemispheres bilaterally, compatible with chronic microvascular ischemic disease. No evidence of large-territorial acute infarction. No parenchymal hemorrhage. No mass lesion. No extra-axial collection. No mass effect or midline shift. No hydrocephalus. Basilar cisterns are patent. Vascular: No hyperdense vessel. Atherosclerotic calcifications are present within the cavernous internal carotid arteries. Skull: No acute fracture or focal lesion. Sinuses/Orbits: Paranasal sinuses and mastoid air cells are clear. Bilateral lens replacement. Otherwise the orbits are  unremarkable. Other: None. IMPRESSION: 1. No acute intracranial abnormality. 2. Similar-appearing left parietal lobe hypodensity along the white matter that is better evaluated on MRI head 06/09/2021. Per MRI, finding concerning for a low-grade neoplasm. " Consider follow-up brain MRI in 6-12 weeks." Electronically Signed   By: Iven Finn M.D.   On: 06/10/2021 15:26   CT Head Wo Contrast  Result Date: 06/09/2021 CLINICAL DATA:  Trauma EXAM: CT HEAD WITHOUT CONTRAST TECHNIQUE: Contiguous axial images were obtained from the base of the skull through the vertex without intravenous contrast. RADIATION DOSE REDUCTION: This exam was performed according to the departmental dose-optimization program which includes automated exposure control, adjustment of the mA and/or kV according to patient size and/or use of iterative reconstruction technique. COMPARISON:  None. FINDINGS: Brain: There are no signs of bleeding within the cranium. There is 3.5 cm area of low-density in the left parietal lobe in the subcortical/periventricular location. There is no significant focal mass effect. Cortical sulci are prominent. Vascular: Unremarkable. Skull: Unremarkable Sinuses/Orbits: There is mild mucosal  thickening in the ethmoid sinus. Mucous retention cyst is seen in the right maxillary sinus. Other: None IMPRESSION: There are no signs of bleeding or focal mass effect in the noncontrast images of brain. There is 3.5 cm focus of low-density in the left parietal lobe in the subcortical and periventricular location. This may suggest recent or subacute or chronic infarct in the left MCA distribution. Follow-up MRI as clinically warranted should be considered. Atrophy.  Small-vessel disease. Chronic sinusitis. Electronically Signed   By: Elmer Picker M.D.   On: 06/09/2021 18:20   CT Cervical Spine Wo Contrast  Result Date: 06/09/2021 CLINICAL DATA:  Facial trauma, fell downstairs EXAM: CT CERVICAL SPINE WITHOUT CONTRAST TECHNIQUE: Multidetector CT imaging of the cervical spine was performed without intravenous contrast. Multiplanar CT image reconstructions were also generated. RADIATION DOSE REDUCTION: This exam was performed according to the departmental dose-optimization program which includes automated exposure control, adjustment of the mA and/or kV according to patient size and/or use of iterative reconstruction technique. COMPARISON:  None. FINDINGS: Alignment: Alignment is grossly anatomic. Skull base and vertebrae: No acute fracture. No primary bone lesion or focal pathologic process. Soft tissues and spinal canal: No prevertebral fluid or swelling. No visible canal hematoma. Disc levels: Multilevel spondylosis greatest at C3-4, C5-6, and C6-7. There is mild diffuse facet hypertrophy, left predominant. Upper chest: Airway is patent.  Lung apices are clear. Other: Reconstructed images demonstrate no additional findings. IMPRESSION: 1. No acute cervical spine fracture. 2. Multilevel spondylosis and facet hypertrophy, greatest at C3-4, C5-6, and C6-7. Electronically Signed   By: Randa Ngo M.D.   On: 06/09/2021 18:23   MR BRAIN WO CONTRAST  Result Date: 06/09/2021 CLINICAL DATA:  Stroke  follow-up EXAM: MRI HEAD WITHOUT CONTRAST TECHNIQUE: Multiplanar, multiecho pulse sequences of the brain and surrounding structures were obtained without intravenous contrast. COMPARISON:  None. FINDINGS: Brain: No acute infarct, mass effect or extra-axial collection. No acute or chronic hemorrhage. There is an area of abnormal hyperintense T2-weighted signal within the left parietal white matter. No midline shift or other mass effect. The midline structures are normal. Vascular: Major flow voids are preserved. Skull and upper cervical spine: Normal calvarium and skull base. Visualized upper cervical spine and soft tissues are normal. Sinuses/Orbits:Left mastoid effusion.  Normal orbits. IMPRESSION: 1. No acute intracranial abnormality. 2. Area of abnormal hyperintense T2-weighted signal within the left parietal white matter, concerning for low grade neoplasm. Contrast administration may be helpful. Consider  follow-up brain MRI in 6-12 weeks. Electronically Signed   By: Ulyses Jarred M.D.   On: 06/09/2021 23:04     PROCEDURES:  Critical Care performed: Yes, see critical care procedure note(s)  Procedures CRITICAL CARE Performed by: Melvenia Needles   Total critical care time: 20 minutes  Critical care time was exclusive of separately billable procedures and treating other patients.  Critical care was necessary to treat or prevent imminent or life-threatening deterioration.  Critical care was time spent personally by me on the following activities: development of treatment plan with patient and/or surrogate as well as nursing, discussions with consultants, evaluation of patient's response to treatment, examination of patient, obtaining history from patient or surrogate, ordering and performing treatments and interventions, ordering and review of laboratory studies, ordering and review of radiographic studies, pulse oximetry and re-evaluation of patient's condition.   MEDICATIONS ORDERED IN  ED: Medications  cefTRIAXone (ROCEPHIN) 1 g in sodium chloride 0.9 % 100 mL IVPB (has no administration in time range)  0.9 %  sodium chloride infusion (0 mLs Intravenous Stopped 06/10/21 1717)     IMPRESSION / MDM / ASSESSMENT AND PLAN / ED COURSE  I reviewed the triage vital signs and the nursing notes.                              Differential diagnosis includes, but is not limited to, alcohol, illicit or prescription medications, or other toxic ingestion; intracranial pathology such as stroke or intracerebral hemorrhage; fever or infectious causes including sepsis; hypoxemia and/or hypercarbia; uremia; trauma; endocrine related disorders such as diabetes, hypoglycemia, and thyroid-related diseases; hypertensive encephalopathy; etc.   The patient is on the cardiac monitor to evaluate for evidence of arrhythmia and/or significant heart rate changes.  Geriatric patient ED evaluation of altered mental status and recent mechanical fall, brought in by the family.  Patient presents in no acute distress, but is only alert to person and place.  Patient is evaluated for his complaints in the ED, and found to have a reassuring negative head CT performed, and reviewed by me.  Further evaluation with basic labs reveals a significant hyponatremia at 112.  Patient also has a white blood cell count elevated 14.8 which may be indicative of his recent trauma, versus infection.  Patient UA does reveal significant bacteriuria and leukocyturia, and culture is pending at this time. Patient's diagnosis is consistent with hyponatremia and altered mental status.  We discussed the need for admission with the patient's wife and adult daughter who are at bedside.  They are agreeable to the plan, and the patient case will be discussed with the hospitalist.   ----------------------------------------- 5:13 PM on 06/10/2021 ----------------------------------------- S/W Dr. Sabino Gasser: he will evaluate the patient in the ED and  admit to the hospitalist service.    FINAL CLINICAL IMPRESSION(S) / ED DIAGNOSES   Final diagnoses:  Hyponatremia  Altered mental status, unspecified altered mental status type  Urinary tract infection without hematuria, site unspecified     Rx / DC Orders   ED Discharge Orders     None        Note:  This document was prepared using Dragon voice recognition software and may include unintentional dictation errors.    Melvenia Needles, PA-C 06/10/21 1735    Melvenia Needles, PA-C 06/10/21 Modena Slater, MD 06/10/21 2311

## 2021-06-10 NOTE — ED Notes (Signed)
This RN messaged MD Girguis about this pt's Na of 113 at this time.  ?

## 2021-06-10 NOTE — Assessment & Plan Note (Addendum)
-   UA noted with negative nitrite, large LE, greater than 50 WBC, many bacteria ?-Urine culture growing Serratia, sensitivities reviewed ?- Continue Rocephin, completed 5 days on 3/5, given lagging confusion likely confounded some by underlying hyponatremia ?

## 2021-06-10 NOTE — H&P (Signed)
History and Physical    Patient: John Perez IOX:[id number] DOB: 02-26-1941 DOA: 06/10/2021 DOS: the patient was seen and examined on 06/10/2021 PCP: Derinda Late, MD  Patient coming from: Home  Chief Complaint:  Chief Complaint  Patient presents with   Altered Mental Status   Fall    HPI:  John Perez is an 81 yo male with PMH DM II, hard of hearing, HTN, HLD who presented to the ER today with worsening confusion at home. He was also seen in the ER on 06/09/2021 after a fall at home when walking down the steps.  It was reported that he lost his balance and fell forward. Head, neck, and shoulder imaging were negative for acute fractures. Head CT did show a 3.5 cm focus of low density in the left parietal lobe which was further evaluated with MRI brain and was noted to be hyperintense on T2 weighted signal which may suggest a possible low-grade neoplasm.  It was recommended to have repeat MRI brain in 6 to 12 weeks. No labs were ordered during that encounter.  Today when seen in the ER for his confusion he underwent further work-up and was found to have sodium 112. Other notable labs included BUN 16, creatinine 0.48, AST 106, ALT 52, total bili 1.4. WBC 14.8, hemoglobin 13.4 g/dL, hematocrit 35.9, platelets 253.  He was given a 1 L bag of saline in the ER.  Urinalysis was also obtained in the ER which showed large LE, greater than 50 WBC, many bacteria.  Assessment and Plan: * Hyponatremia- (present on admission) - Na 112 on presentation; labs reviewed on care everywhere lowest recently seen was 124 on 05/29/2021 otherwise has been essentially over 130 historically - wife is confident that patient does not drink etoh. Home meds reviewed, he was recently started on Paxil after an office visit on 06/05/21 although would not expect adverse effect this rapidly. Other considered differentials would be excessive water intake vs the density in the parietal lobe concerning for possible malignancy as  this could cause an SIADH hyponatremia picture vs a hormone abnormality (TSH, adrenals) - check serum osmo and urine osmo - check urine Na and creatinine - check TSH - s/p 1L NS in ER after 112 level noted; will repeat stat Na now and decide if needs 3% NS, likely still will given that he's still symptomatic with AMS (though UA also suggests infection which could of course contribute) - trend BMP q4h. Goal correction is 4 - 6 mmol/L over the next 6 hours, and no more than 8 - 10 mmol/L within the next 24 hours  UTI (urinary tract infection) - UA noted with negative nitrite, large LE, greater than 50 WBC, many bacteria -Given ambiguity of patient being symptomatic, prudent to treat - Start Rocephin - Follow-up urine culture  Abnormal brain MRI - MRI brain on 2/28 shows ~3.5 cm hyperintense abnormal T2 weighted signal concerning for low-grade neoplasm -Repeat MRI brain was recommended in 6 to 12 weeks but pending above work-up, may need sooner evaluation but will decide as hyponatremia further resolves  Abnormal LFTs - unclear etiology; possibly from cholestasis from infection - hold off on further workup for now and will trend after IVF  Diabetes mellitus type 2, insulin dependent (HCC) - check A1c - continue SSI and CBG monitoring for now     Review of Systems: Unable to review all systems due to lack of cooperation from patient. Cognitive impairment Past Medical History:  Diagnosis Date   Diabetes  mellitus type 2, insulin dependent ([place])    HOH (hard of hearing)    Hypercholesteremia    Hypertension    Past Surgical History:  Procedure Laterality Date   CATARACT EXTRACTION W/PHACO Left 02/25/2020   Procedure: CATARACT EXTRACTION PHACO AND INTRAOCULAR LENS PLACEMENT ([facility]) LEFT DIABETIC;  Surgeon: Eulogio Bear, MD;  Location: [facility];  Service: Ophthalmology;  Laterality: Left;  4.98 0:39.4   CATARACT EXTRACTION W/PHACO Right 03/17/2020   Procedure: CATARACT  EXTRACTION PHACO AND INTRAOCULAR LENS PLACEMENT ([place]) RIGHT DIABETIC;  Surgeon: Eulogio Bear, MD;  Location: [facility];  Service: Ophthalmology;  Laterality: Right;  6.33 0:55.3   GUM SURGERY     HERNIA REPAIR     x2   Social History:  reports that he quit smoking about 33 years ago. He has never used smokeless tobacco. He reports that he does not currently use alcohol. No history on file for drug use.  No Known Allergies  No family history on file.  Prior to Admission medications   Medication Sig Start Date End Date Taking? Authorizing Provider  ASPIRIN 81 PO Take by mouth daily.    [provider]  benazepril (LOTENSIN) 20 MG tablet Take 20 mg by mouth daily.    [provider]  finasteride (PROSCAR) 5 MG tablet Take 5 mg by mouth daily.    [provider]  insulin glargine (LANTUS) 100 UNIT/ML injection Inject 16 Units into the skin daily. evening    [provider]  MELATONIN PO Take by mouth at bedtime as needed.    [provider]  metFORMIN (GLUMETZA) 500 MG (MOD) 24 hr tablet Take 500 mg by mouth daily. With supper    [provider]  Multiple Vitamin (MULTIVITAMIN PO) Take by mouth daily.    [provider]  Omega-3 Fatty Acids (FISH OIL PO) Take by mouth daily.    [provider]  pravastatin (PRAVACHOL) 20 MG tablet Take 20 mg by mouth daily.    [provider]  tamsulosin (FLOMAX) 0.4 MG CAPS capsule Take 0.4 mg by mouth.    [provider]  traMADol (ULTRAM) 50 MG tablet Take 1 tablet (50 mg total) by mouth every 6 (six) hours as needed for up to 5 days. 06/09/21 06/14/21  Arta Silence, MD    Physical Exam: Vitals:   06/10/21 1511 06/10/21 1545 06/10/21 1600 06/10/21 1630  BP:  (!) 168/73 (!) 154/75 (!) 145/72  Pulse:  70 70 69  Resp:  14 13 16   Temp: 98.6 F (37 C)     TempSrc: Oral     SpO2:  98% 98% 100%  Height:       Physical Exam Constitutional:       General: He is not in acute distress.    Comments: Grossly confused  HENT:     Head: Normocephalic and atraumatic.     Mouth/Throat:     Mouth: Mucous membranes are moist.  Eyes:     Extraocular Movements: Extraocular movements intact.  Cardiovascular:     Rate and Rhythm: Normal rate and regular rhythm.     Heart sounds: Normal heart sounds.  Pulmonary:     Effort: Pulmonary effort is normal. No respiratory distress.     Breath sounds: Normal breath sounds. No wheezing.  Abdominal:     General: Bowel sounds are normal. There is no distension.     Palpations: Abdomen is soft.     Tenderness: There is no  abdominal tenderness.  Musculoskeletal:        General: Normal range of motion.     Cervical back: Normal range of motion and neck supple.  Skin:    General: Skin is warm and dry.  Neurological:     Mental Status: He is alert.     Comments: Follows commands after multiple cues with obvious confusion.  Oriented to name, president.  Did not know the year or where he was     Data Reviewed: Results for orders placed or performed during the hospital encounter of 06/10/21 (from the past 24 hour(s))  Comprehensive metabolic panel     Status: Abnormal   Collection Time: 06/10/21  3:12 PM  Result Value Ref Range   Sodium 112 (LL) 135 - 145 mmol/L   Potassium 3.9 3.5 - 5.1 mmol/L   Chloride 79 (L) 98 - 111 mmol/L   CO2 20 (L) 22 - 32 mmol/L   Glucose, Bld 107 (H) 70 - 99 mg/dL   BUN 16 8 - 23 mg/dL   Creatinine, Ser 0.48 (L) 0.61 - 1.24 mg/dL   Calcium 8.6 (L) 8.9 - 10.3 mg/dL   Total Protein 6.3 (L) 6.5 - 8.1 g/dL   Albumin 3.7 3.5 - 5.0 g/dL   AST 106 (H) 15 - 41 U/L   ALT 52 (H) 0 - 44 U/L   Alkaline Phosphatase 81 38 - 126 U/L   Total Bilirubin 1.4 (H) 0.3 - 1.2 mg/dL   GFR, Estimated >60 >60 mL/min   Anion gap 13 5 - 15  CBC     Status: Abnormal   Collection Time: 06/10/21  3:12 PM  Result Value Ref Range   WBC 14.8 (H) 4.0 - 10.5 K/uL   RBC 3.83 (L) 4.22 - 5.81  MIL/uL   Hemoglobin 13.4 13.0 - 17.0 g/dL   HCT 35.9 (L) 39.0 - 52.0 %   MCV 93.7 80.0 - 100.0 fL   MCH 35.0 (H) 26.0 - 34.0 pg   MCHC 37.3 (H) 30.0 - 36.0 g/dL   RDW 11.0 (L) 11.5 - 15.5 %   Platelets 253 150 - 400 K/uL   nRBC 0.0 0.0 - 0.2 %  CBG monitoring, ED     Status: Abnormal   Collection Time: 06/10/21  3:13 PM  Result Value Ref Range   Glucose-Capillary 118 (H) 70 - 99 mg/dL  Urinalysis, Complete w Microscopic Urine, Clean Catch     Status: Abnormal   Collection Time: 06/10/21  3:42 PM  Result Value Ref Range   Color, Urine YELLOW (A) YELLOW   APPearance CLOUDY (A) CLEAR   Specific Gravity, Urine 1.019 1.005 - 1.030   pH 5.0 5.0 - 8.0   Glucose, UA NEGATIVE NEGATIVE mg/dL   Hgb urine dipstick MODERATE (A) NEGATIVE   Bilirubin Urine NEGATIVE NEGATIVE   Ketones, ur 5 (A) NEGATIVE mg/dL   Protein, ur 100 (A) NEGATIVE mg/dL   Nitrite NEGATIVE NEGATIVE   Leukocytes,Ua LARGE (A) NEGATIVE   WBC, UA >50 (H) 0 - 5 WBC/hpf   Bacteria, UA MANY (A) NONE SEEN   Squamous Epithelial / LPF NONE SEEN 0 - 5   Mucus PRESENT   Resp Panel by RT-PCR (Flu A&B, Covid) Nasopharyngeal Swab     Status: None   Collection Time: 06/10/21  4:41 PM   Specimen: Nasopharyngeal Swab; Nasopharyngeal(NP) swabs in vial transport medium  Result Value Ref Range   SARS Coronavirus 2 by RT PCR NEGATIVE NEGATIVE   Influenza A by  PCR NEGATIVE NEGATIVE   Influenza B by PCR NEGATIVE NEGATIVE  Sodium     Status: Abnormal   Collection Time: 06/10/21  5:20 PM  Result Value Ref Range   Sodium 113 (LL) 135 - 145 mmol/L    I have Reviewed nursing notes, Vitals, and Lab results since pt's last encounter. Pertinent lab results : see above I have ordered test including BMP, CBC, Mg I have reviewed the last note from staff over past 24 hours I have discussed pt's care plan and test results with nursing staff, case manager   Advance Care Planning:   Code Status: Full Code   Consults:   Family Communication:  Wife  Severity of Illness: The appropriate patient status for this patient is INPATIENT. Inpatient status is judged to be reasonable and necessary in order to provide the required intensity of service to ensure the patient's safety. The patient's presenting symptoms, physical exam findings, and initial radiographic and laboratory data in the context of their chronic comorbidities is felt to place them at high risk for further clinical deterioration. Furthermore, it is not anticipated that the patient will be medically stable for discharge from the hospital within 2 midnights of admission.   * I certify that at the point of admission it is my clinical judgment that the patient will require inpatient hospital care spanning beyond 2 midnights from the point of admission due to high intensity of service, high risk for further deterioration and high frequency of surveillance required.*  Author: Dwyane Dee, MD 06/10/2021 6:08 PM  For on call review [email or web address].

## 2021-06-10 NOTE — Progress Notes (Signed)
MEDICATION RELATED CONSULT NOTE ? ? ?Pharmacy Consult for hypertonic saline ?Indication: hyponatremia ? ?No Known Allergies ? ?Patient Measurements: ?Height: 5\' 9"  (175.3 cm) ?IBW/kg (Calculated) : 70.7 ? ? ?Vital Signs: ?Temp: 98.6 ?F (37 ?C) (03/01 1511) ?Temp Source: Oral (03/01 1511) ?BP: 145/72 (03/01 1630) ?Pulse Rate: 69 (03/01 1630) ?Intake/Output from previous day: ?No intake/output data recorded. ?Intake/Output from this shift: ?Total I/O ?In: -  ?Out: 500 [Urine:500] ? ?Labs: ?Recent Labs  ?  06/10/21 ?1512  ?WBC 14.8*  ?HGB 13.4  ?HCT 35.9*  ?PLT 253  ?CREATININE 0.48*  ?ALBUMIN 3.7  ?PROT 6.3*  ?AST 106*  ?ALT 52*  ?ALKPHOS 81  ?BILITOT 1.4*  ? ?Estimated Creatinine Clearance: 62.7 mL/min (A) (by C-G formula based on SCr of 0.48 mg/dL (L)).  ? ?Microbiology: ?Recent Results (from the past 720 hour(s))  ?Resp Panel by RT-PCR (Flu A&B, Covid) Nasopharyngeal Swab     Status: None  ? Collection Time: 06/10/21  4:41 PM  ? Specimen: Nasopharyngeal Swab; Nasopharyngeal(NP) swabs in vial transport medium  ?Result Value Ref Range Status  ? SARS Coronavirus 2 by RT PCR NEGATIVE NEGATIVE Final  ?  Comment: (NOTE) ?SARS-CoV-2 target nucleic acids are NOT DETECTED. ? ?The SARS-CoV-2 RNA is generally detectable in upper respiratory ?specimens during the acute phase of infection. The lowest ?concentration of SARS-CoV-2 viral copies this assay can detect is ?138 copies/mL. A negative result does not preclude SARS-Cov-2 ?infection and should not be used as the sole basis for treatment or ?other patient management decisions. A negative result may occur with  ?improper specimen collection/handling, submission of specimen other ?than nasopharyngeal swab, presence of viral mutation(s) within the ?areas targeted by this assay, and inadequate number of viral ?copies(<138 copies/mL). A negative result must be combined with ?clinical observations, patient history, and epidemiological ?information. The expected result is  Negative. ? ?Fact Sheet for Patients:  ?EntrepreneurPulse.com.au ? ?Fact Sheet for Healthcare Providers:  ?IncredibleEmployment.be ? ?This test is no t yet approved or cleared by the [place] FDA and  ?has been authorized for detection and/or diagnosis of SARS-CoV-2 by ?FDA under an Emergency Use Authorization (EUA). This EUA will remain  ?in effect (meaning this test can be used) for the duration of the ?COVID-19 declaration under Section 564(b)(1) of the Act, 21 ?U.S.C.section 360bbb-3(b)(1), unless the authorization is terminated  ?or revoked sooner.  ? ? ?  ? Influenza A by PCR NEGATIVE NEGATIVE Final  ? Influenza B by PCR NEGATIVE NEGATIVE Final  ?  Comment: (NOTE) ?The Xpert Xpress SARS-CoV-2/FLU/RSV plus assay is intended as an aid ?in the diagnosis of influenza from Nasopharyngeal swab specimens and ?should not be used as a sole basis for treatment. Nasal washings and ?aspirates are unacceptable for Xpert Xpress SARS-CoV-2/FLU/RSV ?testing. ? ?Fact Sheet for Patients: ?EntrepreneurPulse.com.au ? ?Fact Sheet for Healthcare Providers: ?IncredibleEmployment.be ? ?This test is not yet approved or cleared by the [place] FDA and ?has been authorized for detection and/or diagnosis of SARS-CoV-2 by ?FDA under an Emergency Use Authorization (EUA). This EUA will remain ?in effect (meaning this test can be used) for the duration of the ?COVID-19 declaration under Section 564(b)(1) of the Act, 21 U.S.C. ?section 360bbb-3(b)(1), unless the authorization is terminated or ?revoked. ? ?Performed at [facility], [place], ?[place] [address] ?  ? ? ?Medications:  ?(Not in a hospital admission)  ?Scheduled:  ? [START ON 06/11/2021] enoxaparin (LOVENOX) injection  40 mg Subcutaneous Daily  ? insulin aspart  0-5  Units Subcutaneous QHS  ? [START ON 06/11/2021] insulin aspart  0-9 Units Subcutaneous TID WC  ? sodium chloride  flush  3 mL Intravenous Q12H  ? ?Infusions:  ? cefTRIAXone (ROCEPHIN)  IV 1 g (06/10/21 1800)  ? sodium chloride (hypertonic)    ? ?PRN: acetaminophen **OR** acetaminophen ?Anti-infectives (From admission, onward)  ? ? Start     Dose/Rate Route Frequency Ordered Stop  ? 06/10/21 1800  cefTRIAXone (ROCEPHIN) 1 g in sodium chloride 0.9 % 100 mL IVPB       ? 1 g ?200 mL/hr over 30 Minutes Intravenous Every 24 hours 06/10/21 1735    ? ?  ? ? ?Assessment: ?John Perez presenting with confusion. PMH includes T2DM, hard of hearing, HTN, HLD. Na 112 on presentation. (BL Na 124 on 05/29/21), then on repeat Na 113. 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 ? ?Goal of Therapy:  ?Increase in Na by 4-6 mEq/L in 4-6 hours ? ?Plan: Monitor Na q2h x 2 occurrences, then q4h ?Call RN to stop infusion and notify MD if Na increases by 4 mEq/L or more in the first 2 hours or Na increases by 6 mEq/L or more in the first 4 hour ?Caution for increase of 10-12 mEq/L in 24 hours ? ? ?Wynelle Cleveland, PharmD ?Pharmacy Resident  ?06/10/2021 ?6:15 PM ? ? ? ? ?

## 2021-06-10 NOTE — Hospital Course (Signed)
John Perez is an 81 yo male with PMH DM II, hard of hearing, HTN, HLD who presented to the ER today with worsening confusion at home. ?He was also seen in the ER on 06/09/2021 after a fall at home when walking down the steps.  It was reported that he lost his balance and fell forward. ?Head, neck, and shoulder imaging were negative for acute fractures. ?Head CT did show a 3.5 cm focus of low density in the left parietal lobe which was further evaluated with MRI brain and was noted to be hyperintense on T2 weighted signal which may suggest a possible low-grade neoplasm.  It was recommended to have repeat MRI brain in 6 to 12 weeks. ?No labs were ordered during that encounter. ? ?Today when seen in the ER for his confusion he underwent further work-up and was found to have sodium 112. ?Other notable labs included BUN 16, creatinine 0.48, AST 106, ALT 52, total bili 1.4. ?WBC 14.8, hemoglobin 13.4 g/dL, hematocrit 35.9, platelets 253. ? ?He was given a 1 L bag of saline in the ER.  ?Urinalysis was also obtained in the ER which showed large LE, greater than 50 WBC, many bacteria. ?

## 2021-06-10 NOTE — ED Notes (Signed)
Judd Gaudier, MD notified that Na is 119 ?

## 2021-06-10 NOTE — ED Triage Notes (Addendum)
See first nurse note- Patient had an unwitnessed fall in his closet this morning, patient has been unsteady and impulsive since. Wife reports he did have a cbg reading of 70 and cbg then read 111 after a snack with no improvements in mentation. Unknown if patient hit head/ LOC. ? ? A/ox4 at baseline, at this time patient alert to self and place only.  ? ?Takes baby aspirin daily.  ?

## 2021-06-10 NOTE — ED Notes (Addendum)
Pharmacy notified that Na is 118. okay to keep running sodium chloride (hypertonic) at 25 mL/hr per pharmacy ?

## 2021-06-10 NOTE — ED Triage Notes (Signed)
Per wife and family pt was seen here yesterday for a fall, family reports pt fell again this morning around 6a states since the pt has had increased confusion, not making any since, "babbling".John Perez ?

## 2021-06-10 NOTE — Assessment & Plan Note (Addendum)
-   unclear etiology; possibly from cholestasis from infection  ?-Hepatitis panel negative ?- continue trending LFTs which have been consistently downtrending  ?

## 2021-06-10 NOTE — Assessment & Plan Note (Addendum)
-   MRI brain on 2/28 shows ~3.5 cm hyperintense abnormal T2 weighted signal concerning for low-grade neoplasm ?-Discussed with neurology and he underwent repeat MRI brain with and without contrast on 06/13/2021.  Results showed likely an underlying benign process with repeat MRI brain recommended in 3 months per neurology ?- appreciate neurology evaluation as well ?- B12 very high but given water solubility I do not think toxicity is a factor? ?- normal NH3 ?- follow up pending B1 level  ?

## 2021-06-10 NOTE — ED Notes (Signed)
NT to bedside to bladder scan, pt was eating at this time and was asked to come back later. Rn notified  ?

## 2021-06-10 NOTE — Assessment & Plan Note (Addendum)
-   A1c 6.6% ?- Patient is on Lantus and sliding scale at home.  He also follows strict dietary adherence to a carb consistent diet. ?-His A1c is essentially at goal and I would suspect he does not need basal insulin anymore possibly nor even sliding scale ?-Patient unable to state his actual metformin dose at home and family has to different lists.  Regardless, will increase metformin to max dose in efforts to avoid any further need for insulin ?-He has barely been needed any sliding scale while hospitalized; and his home basal insulin has been on hold as well  ?- I've discussed at length with patient and family that at discharge he should not be continued on Lantus; only sliding scale at most and to continue metformin 2g BID and follow up with endocrinology  ?

## 2021-06-10 NOTE — Progress Notes (Signed)
MEDICATION RELATED CONSULT NOTE ? ? ?Pharmacy Consult for hypertonic saline ?Indication: hyponatremia ? ?No Known Allergies ? ?Patient Measurements: ?Height: 5\' 9"  (175.3 cm) ?IBW/kg (Calculated) : 70.7 ? ? ?Vital Signs: ?Temp: 98.6 ?F (37 ?C) (03/01 1511) ?Temp Source: Oral (03/01 1511) ?BP: 133/64 (03/01 2130) ?Pulse Rate: 66 (03/01 2130) ?Intake/Output from previous day: ?No intake/output data recorded. ?Intake/Output from this shift: ?No intake/output data recorded. ? ?Labs: ?Recent Labs  ?  06/10/21 ?1512 06/10/21 ?1542 06/10/21 ?2122  ?WBC 14.8*  --   --   ?HGB 13.4  --   --   ?HCT 35.9*  --   --   ?PLT 253  --   --   ?CREATININE 0.48*  --  0.45*  ?LABCREA  --  79  --   ?ALBUMIN 3.7  --  3.1*  ?PROT 6.3*  --  5.3*  ?AST 106*  --  95*  ?ALT 52*  --  43  ?ALKPHOS 81  --  65  ?BILITOT 1.4*  --  0.9  ? ? ?Estimated Creatinine Clearance: 62.7 mL/min (A) (by C-G formula based on SCr of 0.45 mg/dL (L)).  ? ?Microbiology: ?Recent Results (from the past 720 hour(s))  ?Resp Panel by RT-PCR (Flu A&B, Covid) Nasopharyngeal Swab     Status: None  ? Collection Time: 06/10/21  4:41 PM  ? Specimen: Nasopharyngeal Swab; Nasopharyngeal(NP) swabs in vial transport medium  ?Result Value Ref Range Status  ? SARS Coronavirus 2 by RT PCR NEGATIVE NEGATIVE Final  ?  Comment: (NOTE) ?SARS-CoV-2 target nucleic acids are NOT DETECTED. ? ?The SARS-CoV-2 RNA is generally detectable in upper respiratory ?specimens during the acute phase of infection. The lowest ?concentration of SARS-CoV-2 viral copies this assay can detect is ?138 copies/mL. A negative result does not preclude SARS-Cov-2 ?infection and should not be used as the sole basis for treatment or ?other patient management decisions. A negative result may occur with  ?improper specimen collection/handling, submission of specimen other ?than nasopharyngeal swab, presence of viral mutation(s) within the ?areas targeted by this assay, and inadequate number of viral ?copies(<138  copies/mL). A negative result must be combined with ?clinical observations, patient history, and epidemiological ?information. The expected result is Negative. ? ?Fact Sheet for Patients:  ?EntrepreneurPulse.com.au ? ?Fact Sheet for Healthcare Providers:  ?IncredibleEmployment.be ? ?This test is no t yet approved or cleared by the [place] FDA and  ?has been authorized for detection and/or diagnosis of SARS-CoV-2 by ?FDA under an Emergency Use Authorization (EUA). This EUA will remain  ?in effect (meaning this test can be used) for the duration of the ?COVID-19 declaration under Section 564(b)(1) of the Act, 21 ?U.S.C.section 360bbb-3(b)(1), unless the authorization is terminated  ?or revoked sooner.  ? ? ?  ? Influenza A by PCR NEGATIVE NEGATIVE Final  ? Influenza B by PCR NEGATIVE NEGATIVE Final  ?  Comment: (NOTE) ?The Xpert Xpress SARS-CoV-2/FLU/RSV plus assay is intended as an aid ?in the diagnosis of influenza from Nasopharyngeal swab specimens and ?should not be used as a sole basis for treatment. Nasal washings and ?aspirates are unacceptable for Xpert Xpress SARS-CoV-2/FLU/RSV ?testing. ? ?Fact Sheet for Patients: ?EntrepreneurPulse.com.au ? ?Fact Sheet for Healthcare Providers: ?IncredibleEmployment.be ? ?This test is not yet approved or cleared by the [place] FDA and ?has been authorized for detection and/or diagnosis of SARS-CoV-2 by ?FDA under an Emergency Use Authorization (EUA). This EUA will remain ?in effect (meaning this test can be used) for the duration of the ?COVID-19 declaration  under Section 564(b)(1) of the Act, 21 U.S.C. ?section 360bbb-3(b)(1), unless the authorization is terminated or ?revoked. ? ?Performed at [facility] [place], [place], ?[place] [address] ?  ? ? ?Medications:  ?(Not in a hospital admission) ?Scheduled:  ? [START ON 06/11/2021] enoxaparin (LOVENOX) injection  40 mg  Subcutaneous Daily  ? insulin aspart  0-5 Units Subcutaneous QHS  ? [START ON 06/11/2021] insulin aspart  0-9 Units Subcutaneous TID WC  ? sodium chloride flush  3 mL Intravenous Q12H  ? ?Infusions:  ? cefTRIAXone (ROCEPHIN)  IV Stopped (06/10/21 1853)  ? sodium chloride (hypertonic) 25 mL/hr at 06/10/21 1952  ? ?PRN: acetaminophen **OR** acetaminophen ?Anti-infectives (From admission, onward)  ? ? Start     Dose/Rate Route Frequency Ordered Stop  ? 06/10/21 1800  cefTRIAXone (ROCEPHIN) 1 g in sodium chloride 0.9 % 100 mL IVPB       ? 1 g ?200 mL/hr over 30 Minutes Intravenous Every 24 hours 06/10/21 1735    ? ?  ? ? ?Assessment: ?80YOM presenting with confusion. PMH includes T2DM, hard of hearing, HTN, HLD. Na 112 on presentation. (BL Na 124 on 05/29/21), then on repeat Na 113. 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 ? ?Goal of Therapy:  ?Increase in Na by 4-6 mEq/L in 4-6 hours ? ?Plan: Monitor Na q2h x 2 occurrences, then q4h ?Call RN to stop infusion and notify MD if Na increases by 4 mEq/L or more in the first 2 hours or Na increases by 6 mEq/L or more in the first 4 hour ?Caution for increase of 10-12 mEq/L in 24 hours ? ?3/1:  Na @1720  =  113 (baseline prior to hypertonic saline) ?3/1:  Na @ 2122 = 118 ? ? ?Obed Samek D, PharmD ?06/10/2021 ?11:32 PM ? ? ? ? ?

## 2021-06-10 NOTE — Assessment & Plan Note (Addendum)
-   Na 112 on presentation; labs reviewed on care everywhere lowest recently seen was 124 on 05/29/2021 otherwise has been essentially over 130 historically ?- wife is confident that patient does not drink etoh. Home meds reviewed, he was recently started on Paxil after an office visit on 06/05/21 although would not expect adverse effect this rapidly. Other considered differentials would be excessive water intake vs the density in the parietal lobe concerning for possible malignancy as this could cause an SIADH hyponatremia picture vs a hormone abnormality (TSH, adrenals) ?- TSH is normal. Low suspicion for adrenal pathology as well ?- patient continues to endorse he drinks excessive free water due to worrying about his diabetes being controlled; this is likely polydipsia in this context ?- FeNa also 0.1% on admission ?- course of 3% followed by LR then NS (Overall Na increased then started to downtrend with LR&NS which raised concern for SIADH ?- nephrology consulted on 3/5 and repeated urine studies (Ur Na increased from 12 to 123 on admission which also suggests SIADH) ?- I had restarted 3% morning of 3/5, but instead plan will now be continuing fluid restriction, stopping 3% and starting NaCl tabs per nephrology; appreciate assistance  ?- follow mentation response and continue trending Na levels ?

## 2021-06-11 DIAGNOSIS — R7989 Other specified abnormal findings of blood chemistry: Secondary | ICD-10-CM

## 2021-06-11 DIAGNOSIS — E119 Type 2 diabetes mellitus without complications: Secondary | ICD-10-CM | POA: Diagnosis not present

## 2021-06-11 DIAGNOSIS — Z794 Long term (current) use of insulin: Secondary | ICD-10-CM

## 2021-06-11 DIAGNOSIS — R9089 Other abnormal findings on diagnostic imaging of central nervous system: Secondary | ICD-10-CM | POA: Diagnosis not present

## 2021-06-11 DIAGNOSIS — E871 Hypo-osmolality and hyponatremia: Secondary | ICD-10-CM | POA: Diagnosis not present

## 2021-06-11 LAB — COMPREHENSIVE METABOLIC PANEL
ALT: 48 U/L — ABNORMAL HIGH (ref 0–44)
AST: 102 U/L — ABNORMAL HIGH (ref 15–41)
Albumin: 3.3 g/dL — ABNORMAL LOW (ref 3.5–5.0)
Alkaline Phosphatase: 67 U/L (ref 38–126)
Anion gap: 6 (ref 5–15)
BUN: 12 mg/dL (ref 8–23)
CO2: 24 mmol/L (ref 22–32)
Calcium: 7.7 mg/dL — ABNORMAL LOW (ref 8.9–10.3)
Chloride: 90 mmol/L — ABNORMAL LOW (ref 98–111)
Creatinine, Ser: 0.43 mg/dL — ABNORMAL LOW (ref 0.61–1.24)
GFR, Estimated: >60 mL/min (ref >60)
Glucose, Bld: 80 mg/dL (ref 70–99)
Potassium: 3.4 mmol/L — ABNORMAL LOW (ref 3.5–5.1)
Sodium: 120 mmol/L — ABNORMAL LOW (ref 135–145)
Total Bilirubin: 1.1 mg/dL (ref 0.3–1.2)
Total Protein: 5.5 g/dL — ABNORMAL LOW (ref 6.5–8.1)

## 2021-06-11 LAB — BASIC METABOLIC PANEL
Anion gap: 12 (ref 5–15)
BUN: 12 mg/dL (ref 8–23)
CO2: 21 mmol/L — ABNORMAL LOW (ref 22–32)
Calcium: 8.1 mg/dL — ABNORMAL LOW (ref 8.9–10.3)
Chloride: 88 mmol/L — ABNORMAL LOW (ref 98–111)
Creatinine, Ser: 0.4 mg/dL — ABNORMAL LOW (ref 0.61–1.24)
GFR, Estimated: >60 mL/min (ref >60)
Glucose, Bld: 72 mg/dL (ref 70–99)
Potassium: 3.6 mmol/L (ref 3.5–5.1)
Sodium: 121 mmol/L — ABNORMAL LOW (ref 135–145)

## 2021-06-11 LAB — CBC WITH DIFFERENTIAL/PLATELET
Abs Immature Granulocytes: 0.06 10*3/uL (ref 0.00–0.07)
Basophils Absolute: 0 10*3/uL (ref 0.0–0.1)
Basophils Relative: 0 %
Eosinophils Absolute: 0 10*3/uL (ref 0.0–0.5)
Eosinophils Relative: 0 %
HCT: 32.3 % — ABNORMAL LOW (ref 39.0–52.0)
Hemoglobin: 12.1 g/dL — ABNORMAL LOW (ref 13.0–17.0)
Immature Granulocytes: 1 %
Lymphocytes Relative: 7 %
Lymphs Abs: 0.6 10*3/uL — ABNORMAL LOW (ref 0.7–4.0)
MCH: 35 pg — ABNORMAL HIGH (ref 26.0–34.0)
MCHC: 37.5 g/dL — ABNORMAL HIGH (ref 30.0–36.0)
MCV: 93.4 fL (ref 80.0–100.0)
Monocytes Absolute: 1 10*3/uL (ref 0.1–1.0)
Monocytes Relative: 13 %
Neutro Abs: 6.6 10*3/uL (ref 1.7–7.7)
Neutrophils Relative %: 79 %
Platelets: 214 10*3/uL (ref 150–400)
RBC: 3.46 MIL/uL — ABNORMAL LOW (ref 4.22–5.81)
RDW: 11 % — ABNORMAL LOW (ref 11.5–15.5)
WBC: 8.3 10*3/uL (ref 4.0–10.5)
nRBC: 0 % (ref 0.0–0.2)

## 2021-06-11 LAB — HEPATITIS PANEL, ACUTE
HCV Ab: NONREACTIVE
Hep A IgM: NONREACTIVE
Hep B C IgM: NONREACTIVE
Hepatitis B Surface Ag: NONREACTIVE

## 2021-06-11 LAB — SODIUM
Sodium: 122 mmol/L — ABNORMAL LOW (ref 135–145)
Sodium: 121 mmol/L — ABNORMAL LOW (ref 135–145)
Sodium: 120 mmol/L — ABNORMAL LOW (ref 135–145)
Sodium: 122 mmol/L — ABNORMAL LOW (ref 135–145)
Sodium: 122 mmol/L — ABNORMAL LOW (ref 135–145)

## 2021-06-11 LAB — MAGNESIUM: Magnesium: 1.8 mg/dL (ref 1.7–2.4)

## 2021-06-11 LAB — GLUCOSE, CAPILLARY
Glucose-Capillary: 148 mg/dL — ABNORMAL HIGH (ref 70–99)
Glucose-Capillary: 197 mg/dL — ABNORMAL HIGH (ref 70–99)
Glucose-Capillary: 219 mg/dL — ABNORMAL HIGH (ref 70–99)
Glucose-Capillary: 165 mg/dL — ABNORMAL HIGH (ref 70–99)

## 2021-06-11 LAB — HEMOGLOBIN A1C
Hgb A1c MFr Bld: 6.6 % — ABNORMAL HIGH (ref 4.8–5.6)
Mean Plasma Glucose: 143 mg/dL

## 2021-06-11 MED ORDER — POTASSIUM CHLORIDE CRYS ER 20 MEQ PO TBCR
40.0000 meq | EXTENDED_RELEASE_TABLET | Freq: Once | ORAL | Status: AC
  Administered 2021-06-11: 40 meq via ORAL
  Filled 2021-06-11: qty 2

## 2021-06-11 NOTE — Progress Notes (Signed)
?   06/11/21 1000  ?Clinical Encounter Type  ?Visited With Patient and family together  ?Visit Type Initial;Social support  ?Spiritual Encounters  ?Spiritual Needs Prayer  ? ?Chaplain found patient well supported by family as he moves in to changes that come with getting older. Chaplain provided reflective listening and meaningful conversation allowing patient to express his thoughts. Chaplain intervened with prayer. ?

## 2021-06-11 NOTE — Progress Notes (Signed)
MEDICATION RELATED CONSULT NOTE ? ? ?Pharmacy Consult for hypertonic saline ?Indication: hyponatremia ? ?No Known Allergies ? ?Patient Measurements: ?Height: 5\' 9"  (175.3 cm) ?IBW/kg (Calculated) : 70.7 ? ? ?Vital Signs: ?Temp: 97.7 ?F (36.5 ?C) (03/02 0348) ?BP: 134/57 (03/02 0348) ?Pulse Rate: 63 (03/02 0348) ?Intake/Output from previous day: ?03/01 0701 - 03/02 0700 ?In: 128.7 [I.V.:128.7] ?Out: 500 [Urine:500] ?Intake/Output from this shift: ?Total I/O ?In: 128.7 [I.V.:128.7] ?Out: -  ? ?Labs: ?Recent Labs  ?  06/10/21 ?1512 06/10/21 ?1542 06/10/21 ?2122 06/11/21 ?0030 06/11/21 ?0200  ?WBC 14.8*  --   --   --  8.3  ?HGB 13.4  --   --   --  12.1*  ?HCT 35.9*  --   --   --  32.3*  ?PLT 253  --   --   --  214  ?CREATININE 0.48*  --  0.45* 0.40* 0.43*  ?LABCREA  --  79  --   --   --   ?MG  --   --   --   --  1.8  ?ALBUMIN 3.7  --  3.1*  --  3.3*  ?PROT 6.3*  --  5.3*  --  5.5*  ?AST 106*  --  95*  --  102*  ?ALT 52*  --  43  --  48*  ?ALKPHOS 81  --  65  --  67  ?BILITOT 1.4*  --  0.9  --  1.1  ? ? ?Estimated Creatinine Clearance: 62.7 mL/min (A) (by C-G formula based on SCr of 0.43 mg/dL (L)).  ? ?Microbiology: ?Recent Results (from the past 720 hour(s))  ?Resp Panel by RT-PCR (Flu A&B, Covid) Nasopharyngeal Swab     Status: None  ? Collection Time: 06/10/21  4:41 PM  ? Specimen: Nasopharyngeal Swab; Nasopharyngeal(NP) swabs in vial transport medium  ?Result Value Ref Range Status  ? SARS Coronavirus 2 by RT PCR NEGATIVE NEGATIVE Final  ?  Comment: (NOTE) ?SARS-CoV-2 target nucleic acids are NOT DETECTED. ? ?The SARS-CoV-2 RNA is generally detectable in upper respiratory ?specimens during the acute phase of infection. The lowest ?concentration of SARS-CoV-2 viral copies this assay can detect is ?138 copies/mL. A negative result does not preclude SARS-Cov-2 ?infection and should not be used as the sole basis for treatment or ?other patient management decisions. A negative result may occur with  ?improper specimen  collection/handling, submission of specimen other ?than nasopharyngeal swab, presence of viral mutation(s) within the ?areas targeted by this assay, and inadequate number of viral ?copies(<138 copies/mL). A negative result must be combined with ?clinical observations, patient history, and epidemiological ?information. The expected result is Negative. ? ?Fact Sheet for Patients:  ?EntrepreneurPulse.com.au ? ?Fact Sheet for Healthcare Providers:  ?IncredibleEmployment.be ? ?This test is no t yet approved or cleared by the [place] FDA and  ?has been authorized for detection and/or diagnosis of SARS-CoV-2 by ?FDA under an Emergency Use Authorization (EUA). This EUA will remain  ?in effect (meaning this test can be used) for the duration of the ?COVID-19 declaration under Section 564(b)(1) of the Act, 21 ?U.S.C.section 360bbb-3(b)(1), unless the authorization is terminated  ?or revoked sooner.  ? ? ?  ? Influenza A by PCR NEGATIVE NEGATIVE Final  ? Influenza B by PCR NEGATIVE NEGATIVE Final  ?  Comment: (NOTE) ?The Xpert Xpress SARS-CoV-2/FLU/RSV plus assay is intended as an aid ?in the diagnosis of influenza from Nasopharyngeal swab specimens and ?should not be used as a sole basis for treatment. Nasal washings  and ?aspirates are unacceptable for Xpert Xpress SARS-CoV-2/FLU/RSV ?testing. ? ?Fact Sheet for Patients: ?EntrepreneurPulse.com.au ? ?Fact Sheet for Healthcare Providers: ?IncredibleEmployment.be ? ?This test is not yet approved or cleared by the [place] FDA and ?has been authorized for detection and/or diagnosis of SARS-CoV-2 by ?FDA under an Emergency Use Authorization (EUA). This EUA will remain ?in effect (meaning this test can be used) for the duration of the ?COVID-19 declaration under Section 564(b)(1) of the Act, 21 U.S.C. ?section 360bbb-3(b)(1), unless the authorization is terminated or ?revoked. ? ?Performed at [facility], [place], ?[place] [address] ?  ? ? ?Medications:  ?Medications Prior to Admission  ?Medication Sig Dispense Refill Last Dose  ? ASPIRIN 81 PO Take by mouth daily.   06/09/2021  ? benazepril (LOTENSIN) 20 MG tablet Take 20 mg by mouth daily.   06/09/2021  ? finasteride (PROSCAR) 5 MG tablet Take 5 mg by mouth daily.   06/09/2021  ? insulin glargine (LANTUS) 100 UNIT/ML injection Inject 16 Units into the skin daily. evening   06/09/2021 at 1200  ? MELATONIN PO Take by mouth at bedtime as needed.   06/09/2021  ? metFORMIN (GLUMETZA) 500 MG (MOD) 24 hr tablet Take 500 mg by mouth daily. With supper   06/09/2021  ? NOVOLOG FLEXPEN 100 UNIT/ML FlexPen Inject 25 Units into the skin daily.   06/09/2021  ? Omega-3 Fatty Acids (FISH OIL PO) Take by mouth daily.   06/09/2021  ? PARoxetine (PAXIL) 10 MG tablet Take 10 mg by mouth daily.   06/09/2021  ? pravastatin (PRAVACHOL) 20 MG tablet Take 20 mg by mouth daily.   06/09/2021  ? tamsulosin (FLOMAX) 0.4 MG CAPS capsule Take 0.4 mg by mouth.   06/09/2021  ? traMADol (ULTRAM) 50 MG tablet Take 1 tablet (50 mg total) by mouth every 6 (six) hours as needed for up to 5 days. 15 tablet 0 prn at prn  ? Multiple Vitamin (MULTIVITAMIN PO) Take by mouth daily.     ? ?Scheduled:  ? enoxaparin (LOVENOX) injection  40 mg Subcutaneous Daily  ? insulin aspart  0-5 Units Subcutaneous QHS  ? insulin aspart  0-9 Units Subcutaneous TID WC  ? sodium chloride flush  3 mL Intravenous Q12H  ? ?Infusions:  ? cefTRIAXone (ROCEPHIN)  IV Stopped (06/10/21 1853)  ? ?PRN: acetaminophen **OR** acetaminophen ?Anti-infectives (From admission, onward)  ? ? Start     Dose/Rate Route Frequency Ordered Stop  ? 06/10/21 1800  cefTRIAXone (ROCEPHIN) 1 g in sodium chloride 0.9 % 100 mL IVPB       ? 1 g ?200 mL/hr over 30 Minutes Intravenous Every 24 hours 06/10/21 1735    ? ?  ? ? ?Assessment: ?80YOM presenting with confusion. PMH includes T2DM, hard of hearing, HTN, HLD. Na 112 on presentation.  (BL Na 124 on 05/29/21), then on repeat Na 113. 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 ? ?Goal of Therapy:  ?Increase in Na by 4-6 mEq/L in 4-6 hours ? ?Plan: Monitor Na q2h x 2 occurrences, then q4h ?Call RN to stop infusion and notify MD if Na increases by 4 mEq/L or more in the first 2 hours or Na increases by 6 mEq/L or more in the first 4 hour ?Caution for increase of 10-12 mEq/L in 24 hours ? ?3/1:  Na @1720  =  113 (baseline prior to hypertonic saline) ?3/1:  Na @ 2122 = 118 ?3/2:  Na @ 0030 = 121 (8 mEq rise in Na in 7 hrs)  ?        -  notified NP of increase, hypertonic saline  ?           d/c'd @ 0130.  ?3/2:  Na @ 0200 = 120  ? ? ?Gwenna Fuston D, PharmD ?06/11/2021 ?3:54 AM ? ? ? ? ?

## 2021-06-11 NOTE — Progress Notes (Addendum)
MEDICATION RELATED CONSULT NOTE ? ? ?Pharmacy Consult for hypertonic saline ?Indication: hyponatremia ? ?No Known Allergies ? ?Patient Measurements: ?Height: 5\' 9"  (175.3 cm) ?IBW/kg (Calculated) : 70.7 ? ? ?Vital Signs: ?Temp: 98.6 ?F (37 ?C) (03/01 1511) ?Temp Source: Oral (03/01 1511) ?BP: 133/82 (03/02 0100) ?Pulse Rate: 70 (03/02 0100) ?Intake/Output from previous day: ?03/01 0701 - 03/02 0700 ?In: -  ?Out: 500 [Urine:500] ?Intake/Output from this shift: ?No intake/output data recorded. ? ?Labs: ?Recent Labs  ?  06/10/21 ?1512 06/10/21 ?1542 06/10/21 ?2122 06/11/21 ?0030  ?WBC 14.8*  --   --   --   ?HGB 13.4  --   --   --   ?HCT 35.9*  --   --   --   ?PLT 253  --   --   --   ?CREATININE 0.48*  --  0.45* 0.40*  ?LABCREA  --  79  --   --   ?ALBUMIN 3.7  --  3.1*  --   ?PROT 6.3*  --  5.3*  --   ?AST 106*  --  95*  --   ?ALT 52*  --  43  --   ?ALKPHOS 81  --  65  --   ?BILITOT 1.4*  --  0.9  --   ? ? ?Estimated Creatinine Clearance: 62.7 mL/min (A) (by C-G formula based on SCr of 0.4 mg/dL (L)).  ? ?Microbiology: ?Recent Results (from the past 720 hour(s))  ?Resp Panel by RT-PCR (Flu A&B, Covid) Nasopharyngeal Swab     Status: None  ? Collection Time: 06/10/21  4:41 PM  ? Specimen: Nasopharyngeal Swab; Nasopharyngeal(NP) swabs in vial transport medium  ?Result Value Ref Range Status  ? SARS Coronavirus 2 by RT PCR NEGATIVE NEGATIVE Final  ?  Comment: (NOTE) ?SARS-CoV-2 target nucleic acids are NOT DETECTED. ? ?The SARS-CoV-2 RNA is generally detectable in upper respiratory ?specimens during the acute phase of infection. The lowest ?concentration of SARS-CoV-2 viral copies this assay can detect is ?138 copies/mL. A negative result does not preclude SARS-Cov-2 ?infection and should not be used as the sole basis for treatment or ?other patient management decisions. A negative result may occur with  ?improper specimen collection/handling, submission of specimen other ?than nasopharyngeal swab, presence of viral  mutation(s) within the ?areas targeted by this assay, and inadequate number of viral ?copies(<138 copies/mL). A negative result must be combined with ?clinical observations, patient history, and epidemiological ?information. The expected result is Negative. ? ?Fact Sheet for Patients:  ?EntrepreneurPulse.com.au ? ?Fact Sheet for Healthcare Providers:  ?IncredibleEmployment.be ? ?This test is no t yet approved or cleared by the [place] FDA and  ?has been authorized for detection and/or diagnosis of SARS-CoV-2 by ?FDA under an Emergency Use Authorization (EUA). This EUA will remain  ?in effect (meaning this test can be used) for the duration of the ?COVID-19 declaration under Section 564(b)(1) of the Act, 21 ?U.S.C.section 360bbb-3(b)(1), unless the authorization is terminated  ?or revoked sooner.  ? ? ?  ? Influenza A by PCR NEGATIVE NEGATIVE Final  ? Influenza B by PCR NEGATIVE NEGATIVE Final  ?  Comment: (NOTE) ?The Xpert Xpress SARS-CoV-2/FLU/RSV plus assay is intended as an aid ?in the diagnosis of influenza from Nasopharyngeal swab specimens and ?should not be used as a sole basis for treatment. Nasal washings and ?aspirates are unacceptable for Xpert Xpress SARS-CoV-2/FLU/RSV ?testing. ? ?Fact Sheet for Patients: ?EntrepreneurPulse.com.au ? ?Fact Sheet for Healthcare Providers: ?IncredibleEmployment.be ? ?This test is not yet approved or  cleared by the [place] and ?has been authorized for detection and/or diagnosis of SARS-CoV-2 by ?FDA under an Emergency Use Authorization (EUA). This EUA will remain ?in effect (meaning this test can be used) for the duration of the ?COVID-19 declaration under Section 564(b)(1) of the Act, 21 U.S.C. ?section 360bbb-3(b)(1), unless the authorization is terminated or ?revoked. ? ?Performed at [facility], [place], ?[place] [address] ?  ? ? ?Medications:  ?(Not in a  hospital admission) ?Scheduled:  ? enoxaparin (LOVENOX) injection  40 mg Subcutaneous Daily  ? insulin aspart  0-5 Units Subcutaneous QHS  ? insulin aspart  0-9 Units Subcutaneous TID WC  ? sodium chloride flush  3 mL Intravenous Q12H  ? ?Infusions:  ? cefTRIAXone (ROCEPHIN)  IV Stopped (06/10/21 1853)  ? sodium chloride (hypertonic) 25 mL/hr at 06/10/21 1952  ? ?PRN: acetaminophen **OR** acetaminophen ?Anti-infectives (From admission, onward)  ? ? Start     Dose/Rate Route Frequency Ordered Stop  ? 06/10/21 1800  cefTRIAXone (ROCEPHIN) 1 g in sodium chloride 0.9 % 100 mL IVPB       ? 1 g ?200 mL/hr over 30 Minutes Intravenous Every 24 hours 06/10/21 1735    ? ?  ? ? ?Assessment: ?80YOM presenting with confusion. PMH includes T2DM, hard of hearing, HTN, HLD. Na 112 on presentation. (BL Na 124 on 05/29/21), then on repeat Na 113. 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 ? ?Goal of Therapy:  ?Increase in Na by 4-6 mEq/L in 4-6 hours ? ?Plan: Monitor Na q2h x 2 occurrences, then q4h ?Call RN to stop infusion and notify MD if Na increases by 4 mEq/L or more in the first 2 hours or Na increases by 6 mEq/L or more in the first 4 hour ?Caution for increase of 10-12 mEq/L in 24 hours ? ?3/1:  Na @1720  =  113 (baseline prior to hypertonic saline) ?3/1:  Na @ 2122 = 118 ?3/2:  Na @ 0030 = 121 (8 mEq rise in Na in 7 hrs)  ?        -  notified NP of increase, hypertonic saline d/c'd @    ?           0130.  ? ? ?Dyan Creelman D, PharmD ?06/11/2021 ?1:08 AM ? ? ? ? ?

## 2021-06-11 NOTE — Progress Notes (Signed)
Progress Note   Patient: John Perez LFY:[id number] DOB: 1940-09-13 DOA: 06/10/2021     1 DOS: the patient was seen and examined on 06/11/2021   Brief hospital course: Mr. Haymond is an 81 yo male with PMH DM II, hard of hearing, HTN, HLD who presented to the ER today with worsening confusion at home. He was also seen in the ER on 06/09/2021 after a fall at home when walking down the steps.  It was reported that he lost his balance and fell forward. Head, neck, and shoulder imaging were negative for acute fractures. Head CT did show a 3.5 cm focus of low density in the left parietal lobe which was further evaluated with MRI brain and was noted to be hyperintense on T2 weighted signal which may suggest a possible low-grade neoplasm.  It was recommended to have repeat MRI brain in 6 to 12 weeks. No labs were ordered during that encounter.  Today when seen in the ER for his confusion he underwent further work-up and was found to have sodium 112. Other notable labs included BUN 16, creatinine 0.48, AST 106, ALT 52, total bili 1.4. WBC 14.8, hemoglobin 13.4 g/dL, hematocrit 35.9, platelets 253.  He was given a 1 L bag of saline in the ER.  Urinalysis was also obtained in the ER which showed large LE, greater than 50 WBC, many bacteria.  Assessment and Plan: * Hyponatremia - Na 112 on presentation; labs reviewed on care everywhere lowest recently seen was 124 on 05/29/2021 otherwise has been essentially over 130 historically - wife is confident that patient does not drink etoh. Home meds reviewed, he was recently started on Paxil after an office visit on 06/05/21 although would not expect adverse effect this rapidly. Other considered differentials would be excessive water intake vs the density in the parietal lobe concerning for possible malignancy as this could cause an SIADH hyponatremia picture vs a hormone abnormality (TSH, adrenals) - TSH is normal. Low suspicion for adrenal pathology as well - patient  continues to endorse he drinks excessive free water due to worrying about his diabetes being controlled; this is likely polydipsia in this context - FeNa also 0.1% - started on 3% NS on admission with rapid correction so discontinued - now will allow further autoregulation and possibly start on LR or NS later tonight - continue q4h Na - mentation improved but still has some confusion; doesn't know year or where he is still; daughter present this am notes general improvement though  UTI (urinary tract infection) - UA noted with negative nitrite, large LE, greater than 50 WBC, many bacteria -Given ambiguity of patient being symptomatic, prudent to treat - Continue Rocephin - Follow-up urine culture  Abnormal LFTs - unclear etiology; possibly from cholestasis from infection  - check hepatitis panel - continue trending LFTs  Abnormal brain MRI - MRI brain on 2/28 shows ~3.5 cm hyperintense abnormal T2 weighted signal concerning for low-grade neoplasm -Repeat MRI brain was recommended in 6 to 12 weeks but pending above work-up, may need sooner evaluation but will decide as hyponatremia further resolves  Diabetes mellitus type 2, insulin dependent (HCC) - check A1c - continue SSI and CBG monitoring for now     Subjective: Daughter present bedside this morning.  She notes his mentation and energy seems a little better compared to yesterday.  He is still somewhat confused especially regarding the year and where he is.  He continues to fixate on talking about drinking a lot of water in  order to control his diabetes.  Physical Exam: Vitals:   06/11/21 0158 06/11/21 0348 06/11/21 0801 06/11/21 1123  BP: (!) 145/71 (!) 134/57 130/68 (!) 144/69  Pulse: 65 63 64 62  Resp: 16 19 16 16   Temp: 97.8 F (36.6 C) 97.7 F (36.5 C) 97.6 F (36.4 C) 97.7 F (36.5 C)  TempSrc:   Oral Oral  SpO2: 100% 99% 100% 100%  Height:       Physical Exam Constitutional:      General: He is not in acute  distress.    Comments: Still confused but seems mildly improved  HENT:     Head: Normocephalic and atraumatic.     Mouth/Throat:     Mouth: Mucous membranes are moist.  Eyes:     Extraocular Movements: Extraocular movements intact.  Cardiovascular:     Rate and Rhythm: Normal rate and regular rhythm.     Heart sounds: Normal heart sounds.  Pulmonary:     Effort: Pulmonary effort is normal. No respiratory distress.     Breath sounds: Normal breath sounds. No wheezing.  Abdominal:     General: Bowel sounds are normal. There is no distension.     Palpations: Abdomen is soft.     Tenderness: There is no abdominal tenderness.  Musculoskeletal:        General: Normal range of motion.     Cervical back: Normal range of motion and neck supple.  Skin:    General: Skin is warm and dry.  Neurological:     Mental Status: He is alert.     Comments: Oriented to name and president.  Still does not know year or where he is but he is speaking more clearly and coherently  Psychiatric:     Comments: Disorganized thought process still     Data Reviewed: Results for orders placed or performed during the hospital encounter of 06/10/21 (from the past 24 hour(s))  Comprehensive metabolic panel     Status: Abnormal   Collection Time: 06/10/21  3:12 PM  Result Value Ref Range   Sodium 112 (LL) 135 - 145 mmol/L   Potassium 3.9 3.5 - 5.1 mmol/L   Chloride 79 (L) 98 - 111 mmol/L   CO2 20 (L) 22 - 32 mmol/L   Glucose, Bld 107 (H) 70 - 99 mg/dL   BUN 16 8 - 23 mg/dL   Creatinine, Ser 0.48 (L) 0.61 - 1.24 mg/dL   Calcium 8.6 (L) 8.9 - 10.3 mg/dL   Total Protein 6.3 (L) 6.5 - 8.1 g/dL   Albumin 3.7 3.5 - 5.0 g/dL   AST 106 (H) 15 - 41 U/L   ALT 52 (H) 0 - 44 U/L   Alkaline Phosphatase 81 38 - 126 U/L   Total Bilirubin 1.4 (H) 0.3 - 1.2 mg/dL   GFR, Estimated >60 >60 mL/min   Anion gap 13 5 - 15  CBC     Status: Abnormal   Collection Time: 06/10/21  3:12 PM  Result Value Ref Range   WBC 14.8 (H)  4.0 - 10.5 K/uL   RBC 3.83 (L) 4.22 - 5.81 MIL/uL   Hemoglobin 13.4 13.0 - 17.0 g/dL   HCT 35.9 (L) 39.0 - 52.0 %   MCV 93.7 80.0 - 100.0 fL   MCH 35.0 (H) 26.0 - 34.0 pg   MCHC 37.3 (H) 30.0 - 36.0 g/dL   RDW 11.0 (L) 11.5 - 15.5 %   Platelets 253 150 - 400 K/uL   nRBC 0.0  0.0 - 0.2 %  TSH     Status: None   Collection Time: 06/10/21  3:12 PM  Result Value Ref Range   TSH 2.080 0.350 - 4.500 uIU/mL  CBG monitoring, ED     Status: Abnormal   Collection Time: 06/10/21  3:13 PM  Result Value Ref Range   Glucose-Capillary 118 (H) 70 - 99 mg/dL  Urinalysis, Complete w Microscopic Urine, Clean Catch     Status: Abnormal   Collection Time: 06/10/21  3:42 PM  Result Value Ref Range   Color, Urine YELLOW (A) YELLOW   APPearance CLOUDY (A) CLEAR   Specific Gravity, Urine 1.019 1.005 - 1.030   pH 5.0 5.0 - 8.0   Glucose, UA NEGATIVE NEGATIVE mg/dL   Hgb urine dipstick MODERATE (A) NEGATIVE   Bilirubin Urine NEGATIVE NEGATIVE   Ketones, ur 5 (A) NEGATIVE mg/dL   Protein, ur 100 (A) NEGATIVE mg/dL   Nitrite NEGATIVE NEGATIVE   Leukocytes,Ua LARGE (A) NEGATIVE   WBC, UA >50 (H) 0 - 5 WBC/hpf   Bacteria, UA MANY (A) NONE SEEN   Squamous Epithelial / LPF NONE SEEN 0 - 5   Mucus PRESENT   Osmolality, urine     Status: None   Collection Time: 06/10/21  3:42 PM  Result Value Ref Range   Osmolality, Ur 607 300 - 900 mOsm/kg  Sodium, urine, random     Status: None   Collection Time: 06/10/21  3:42 PM  Result Value Ref Range   Sodium, Ur 12 mmol/L  Creatinine, urine, random     Status: None   Collection Time: 06/10/21  3:42 PM  Result Value Ref Range   Creatinine, Urine 79 mg/dL  Resp Panel by RT-PCR (Flu A&B, Covid) Nasopharyngeal Swab     Status: None   Collection Time: 06/10/21  4:41 PM   Specimen: Nasopharyngeal Swab; Nasopharyngeal(NP) swabs in vial transport medium  Result Value Ref Range   SARS Coronavirus 2 by RT PCR NEGATIVE NEGATIVE   Influenza A by PCR NEGATIVE NEGATIVE    Influenza B by PCR NEGATIVE NEGATIVE  Sodium     Status: Abnormal   Collection Time: 06/10/21  5:20 PM  Result Value Ref Range   Sodium 113 (LL) 135 - 145 mmol/L  CBG monitoring, ED     Status: None   Collection Time: 06/10/21  9:20 PM  Result Value Ref Range   Glucose-Capillary 96 70 - 99 mg/dL  Osmolality     Status: Abnormal   Collection Time: 06/10/21  9:22 PM  Result Value Ref Range   Osmolality 247 (LL) 275 - 295 mOsm/kg  Comprehensive metabolic panel     Status: Abnormal   Collection Time: 06/10/21  9:22 PM  Result Value Ref Range   Sodium 118 (LL) 135 - 145 mmol/L   Potassium 3.6 3.5 - 5.1 mmol/L   Chloride 87 (L) 98 - 111 mmol/L   CO2 21 (L) 22 - 32 mmol/L   Glucose, Bld 87 70 - 99 mg/dL   BUN 13 8 - 23 mg/dL   Creatinine, Ser 0.45 (L) 0.61 - 1.24 mg/dL   Calcium 8.0 (L) 8.9 - 10.3 mg/dL   Total Protein 5.3 (L) 6.5 - 8.1 g/dL   Albumin 3.1 (L) 3.5 - 5.0 g/dL   AST 95 (H) 15 - 41 U/L   ALT 43 0 - 44 U/L   Alkaline Phosphatase 65 38 - 126 U/L   Total Bilirubin 0.9 0.3 - 1.2 mg/dL  GFR, Estimated >60 >60 mL/min   Anion gap 10 5 - 15  Basic metabolic panel     Status: Abnormal   Collection Time: 06/11/21 12:30 AM  Result Value Ref Range   Sodium 121 (L) 135 - 145 mmol/L   Potassium 3.6 3.5 - 5.1 mmol/L   Chloride 88 (L) 98 - 111 mmol/L   CO2 21 (L) 22 - 32 mmol/L   Glucose, Bld 72 70 - 99 mg/dL   BUN 12 8 - 23 mg/dL   Creatinine, Ser 0.40 (L) 0.61 - 1.24 mg/dL   Calcium 8.1 (L) 8.9 - 10.3 mg/dL   GFR, Estimated >60 >60 mL/min   Anion gap 12 5 - 15  CBC with Differential/Platelet     Status: Abnormal   Collection Time: 06/11/21  2:00 AM  Result Value Ref Range   WBC 8.3 4.0 - 10.5 K/uL   RBC 3.46 (L) 4.22 - 5.81 MIL/uL   Hemoglobin 12.1 (L) 13.0 - 17.0 g/dL   HCT 32.3 (L) 39.0 - 52.0 %   MCV 93.4 80.0 - 100.0 fL   MCH 35.0 (H) 26.0 - 34.0 pg   MCHC 37.5 (H) 30.0 - 36.0 g/dL   RDW 11.0 (L) 11.5 - 15.5 %   Platelets 214 150 - 400 K/uL   nRBC 0.0 0.0 - 0.2 %    Neutrophils Relative % 79 %   Neutro Abs 6.6 1.7 - 7.7 K/uL   Lymphocytes Relative 7 %   Lymphs Abs 0.6 (L) 0.7 - 4.0 K/uL   Monocytes Relative 13 %   Monocytes Absolute 1.0 0.1 - 1.0 K/uL   Eosinophils Relative 0 %   Eosinophils Absolute 0.0 0.0 - 0.5 K/uL   Basophils Relative 0 %   Basophils Absolute 0.0 0.0 - 0.1 K/uL   Immature Granulocytes 1 %   Abs Immature Granulocytes 0.06 0.00 - 0.07 K/uL  Magnesium     Status: None   Collection Time: 06/11/21  2:00 AM  Result Value Ref Range   Magnesium 1.8 1.7 - 2.4 mg/dL  Comprehensive metabolic panel     Status: Abnormal   Collection Time: 06/11/21  2:00 AM  Result Value Ref Range   Sodium 120 (L) 135 - 145 mmol/L   Potassium 3.4 (L) 3.5 - 5.1 mmol/L   Chloride 90 (L) 98 - 111 mmol/L   CO2 24 22 - 32 mmol/L   Glucose, Bld 80 70 - 99 mg/dL   BUN 12 8 - 23 mg/dL   Creatinine, Ser 0.43 (L) 0.61 - 1.24 mg/dL   Calcium 7.7 (L) 8.9 - 10.3 mg/dL   Total Protein 5.5 (L) 6.5 - 8.1 g/dL   Albumin 3.3 (L) 3.5 - 5.0 g/dL   AST 102 (H) 15 - 41 U/L   ALT 48 (H) 0 - 44 U/L   Alkaline Phosphatase 67 38 - 126 U/L   Total Bilirubin 1.1 0.3 - 1.2 mg/dL   GFR, Estimated >60 >60 mL/min   Anion gap 6 5 - 15  Sodium     Status: Abnormal   Collection Time: 06/11/21  6:00 AM  Result Value Ref Range   Sodium 122 (L) 135 - 145 mmol/L  Sodium     Status: Abnormal   Collection Time: 06/11/21  7:41 AM  Result Value Ref Range   Sodium 121 (L) 135 - 145 mmol/L  Glucose, capillary     Status: Abnormal   Collection Time: 06/11/21  8:03 AM  Result Value Ref Range  Glucose-Capillary 148 (H) 70 - 99 mg/dL  Glucose, capillary     Status: Abnormal   Collection Time: 06/11/21 11:48 AM  Result Value Ref Range   Glucose-Capillary 197 (H) 70 - 99 mg/dL  Sodium     Status: Abnormal   Collection Time: 06/11/21  1:18 PM  Result Value Ref Range   Sodium 120 (L) 135 - 145 mmol/L    I have Reviewed nursing notes, Vitals, and Lab results since pt's last  encounter. Pertinent lab results : see above I have ordered test including BMP, CBC, Mg I have reviewed the last note from staff over past 24 hours I have discussed pt's care plan and test results with nursing staff, case manager   Family Communication: daughter   Disposition: Status is: Inpatient Remains inpatient appropriate because: Treatment as outlined in A&P     Planned Discharge Destination: Home  Antimicrobials: Rocephin 3/2 >> current  Consultants:   Procedures:    DVT ppx:  enoxaparin (LOVENOX) injection 40 mg Start: 06/11/21 1000     Code Status: Full Code    Author: Dwyane Dee, MD 06/11/2021 2:37 PM  For on call review [email or web address].

## 2021-06-11 NOTE — Evaluation (Signed)
Physical Therapy Evaluation ?Patient Details ?Name: John Perez ?MRN: [id number] ?DOB: [birth year] ?Today's Date: 06/11/2021 ? ?History of Present Illness ? Mr. Shamblin is an 81 yo male with PMH DM II, hard of hearing, HTN, HLD. ?  ?Clinical Impression ? Pt received in supine position and motivated to participate in therapy.   Pt notes some stiffness in his joint due to being confined to his bed for 2 days straight.  Pt was able to transfer with god technique, however notes to have some instability once in standing due to stiffness according to pt.  Pt reaching for hospital tray for support and has decreased safety awareness when standing.  Pt given walker for support and was able to navigate the hallways with much better safety awareness and technique.  Pt then performed stair training with fair technique, requiring verbal, visual, and tactile cuing for hand placement and for placement on steps for easier transition.  Pt then transferred back to bed and discussed mobility concerns with nursing and family, that he would be safe to get up to go to the bathroom, with support from family or nursing and use of gait belt and walker for time being.  Pt may need walker at d/c depending on progress.  Current discharge plans to home with no therapy services are appropriate at this time.  Pt will continue to benefit from skilled therapy in order to address deficits listed below. ? ?   ? ?Recommendations for follow up therapy are one component of a multi-disciplinary discharge planning process, led by the attending physician.  Recommendations may be updated based on patient status, additional functional criteria and insurance authorization. ? ?Follow Up Recommendations No PT follow up ? ?  ?Assistance Recommended at Discharge None  ?Patient can return home with the following ?   ? ?  ?Equipment Recommendations Rolling walker (2 wheels)  ?Recommendations for Other Services ?    ?  ?Functional Status Assessment Patient has had a recent  decline in their functional status and demonstrates the ability to make significant improvements in function in a reasonable and predictable amount of time.  ? ?  ?Precautions / Restrictions Restrictions ?Weight Bearing Restrictions: No  ? ?  ? ?Mobility ? Bed Mobility ?Overal bed mobility: Needs Assistance ?Bed Mobility: Supine to Sit ?  ?  ?Supine to sit: Min guard ?  ?  ?General bed mobility comments: increased time due to being stiff ?  ? ?Transfers ?Overall transfer level: Needs assistance ?Equipment used: Rolling walker (2 wheels) ?Transfers: Sit to/from Stand ?Sit to Stand: Min guard ?  ?  ?  ?  ?  ?General transfer comment: Pt with unsteadiness upon standing due to weakness and stiffness, but performed much better with sitting from standing position after mobilizing. ?  ? ?Ambulation/Gait ?Ambulation/Gait assistance: Min guard ?Gait Distance (Feet): 380 Feet ?Assistive device: Rolling walker (2 wheels), None ?Gait Pattern/deviations: WFL(Within Functional Limits) ?Gait velocity: decreased ?  ?  ?General Gait Details: Pt with good technique utilizing the walker, then progressed to no AD.  Pt more unsteady without walker, but able to perform without any noted LOB. ? ?Stairs ?Stairs: Yes ?Stairs assistance: Min guard ?Stair Management: One rail Right, One rail Left, Two rails, Alternating pattern, Step to pattern, Forwards ?  ?  ? ?Wheelchair Mobility ?  ? ?Modified Rankin (Stroke Patients Only) ?  ? ?  ? ?Balance Overall balance assessment: Needs assistance ?Sitting-balance support: No upper extremity supported, Feet supported ?Sitting balance-Leahy Scale: Normal ?  ?  ?  Standing balance support: No upper extremity supported, During functional activity ?Standing balance-Leahy Scale: Good ?  ?  ?  ?  ?  ?  ?  ?  ?  ?  ?  ?  ?   ? ? ? ?Pertinent Vitals/Pain Pain Assessment ?Pain Assessment: No/denies pain  ? ? ?Home Living Family/patient expects to be discharged to:: Private residence ?Living Arrangements:  Spouse/significant other ?Available Help at Discharge: Family;Friend(s);Available 24 hours/day ?Type of Home: House ?Home Access: Stairs to enter ?Entrance Stairs-Rails: Can reach both;Right;Left ?Entrance Stairs-Number of Steps: 3 ?Alternate Level Stairs-Number of Steps: 15 ?Home Layout: Multi-level;Able to live on main level with bedroom/bathroom ?Home Equipment: Grab bars - tub/shower;Hand held shower head ?   ?  ?Prior Function Prior Level of Function : Independent/Modified Independent ?  ?  ?  ?  ?  ?  ?  ?  ?  ? ? ?Hand Dominance  ? Dominant Hand: Right ? ?  ?Extremity/Trunk Assessment  ? Upper Extremity Assessment ?Upper Extremity Assessment: Generalized weakness ?  ? ?Lower Extremity Assessment ?Lower Extremity Assessment: Generalized weakness ?  ? ?Cervical / Trunk Assessment ?Cervical / Trunk Assessment: Normal  ?Communication  ? Communication: No difficulties  ?Cognition Arousal/Alertness: Awake/alert ?Behavior During Therapy: [facility] for tasks assessed/performed ?Overall Cognitive Status: Within Functional Limits for tasks assessed ?  ?  ?  ?  ?  ?  ?  ?  ?  ?  ?  ?  ?  ?  ?  ?  ?  ?  ?  ? ?  ?General Comments   ? ?  ?Exercises Total Joint Exercises ?Ankle Circles/Pumps: AROM, Strengthening, Both, 10 reps, Supine ?Quad Sets: AROM, Strengthening, Both, 10 reps, Supine ?Gluteal Sets: AROM, Strengthening, Both, 10 reps, Supine ?Hip ABduction/ADduction: AROM, Strengthening, Both, 10 reps, Supine ?Straight Leg Raises: AROM, Strengthening, Both, 10 reps, Supine ?Long Arc Quad: AROM, Strengthening, Both, 10 reps, Seated ?Marching in Standing: AROM, Strengthening, Both, 10 reps, Standing ?Other Exercises ?Other Exercises: Pt and family educated on role of PT and services provided during hospital stay.  ? ?Assessment/Plan  ?  ?PT Assessment Patient needs continued PT services  ?PT Problem List Decreased strength;Decreased activity tolerance;Decreased balance;Decreased mobility;Decreased safety awareness;Decreased  knowledge of use of DME ? ?   ?  ?PT Treatment Interventions DME instruction;Gait training;Stair training;Functional mobility training;Therapeutic activities;Therapeutic exercise   ? ?PT Goals (Current goals can be found in the Care Plan section)  ?Acute Rehab PT Goals ?Patient Stated Goal: to go home ?PT Goal Formulation: With patient/family ?Time For Goal Achievement: 06/25/21 ?Potential to Achieve Goals: Good ? ?  ?Frequency Min 2X/week ?  ? ? ?Co-evaluation   ?  ?  ?  ?  ? ? ?  ?AM-PAC PT "6 Clicks" Mobility  ?Outcome Measure Help needed turning from your back to your side while in a flat bed without using bedrails?: A Little ?Help needed moving from lying on your back to sitting on the side of a flat bed without using bedrails?: A Little ?Help needed moving to and from a bed to a chair (including a wheelchair)?: A Little ?Help needed standing up from a chair using your arms (e.g., wheelchair or bedside chair)?: A Little ?Help needed to walk in hospital room?: A Little ?Help needed climbing 3-5 steps with a railing? : A Little ?6 Click Score: 18 ? ?  ?End of Session Equipment Utilized During Treatment: Gait belt ?Activity Tolerance: Patient tolerated treatment well ?Patient left: in bed;with call bell/phone  within reach;with nursing/sitter in room ?Nurse Communication: Mobility status ?PT Visit Diagnosis: Unsteadiness on feet (R26.81);Muscle weakness (generalized) (M62.81) ?  ? ?Time: 2025-4270 ?PT Time Calculation (min) (ACUTE ONLY): 48 min ? ? ?Charges:   PT Evaluation ?$PT Eval Low Complexity: 1 Low ?PT Treatments ?$Gait Training: 8-22 mins ?$Therapeutic Exercise: 8-22 mins ?$Therapeutic Activity: 8-22 mins ?  ?   ? ? ?Gwenlyn Saran, PT, DPT ?06/11/21, 5:53 PM ? ? ?Christie Nottingham ?06/11/2021, 5:49 PM ? ?

## 2021-06-11 NOTE — Progress Notes (Signed)
Cross Cover ?Hypertonic saline discontinued earlier due to increase of sodium 8 mmol/l at 121 from 113.  Sodium levels every 2 hours ordered, however, lab cancelled the 0400.  0600 sodium level is pending ?

## 2021-06-11 NOTE — Progress Notes (Addendum)
MEDICATION RELATED CONSULT NOTE ? ? ?Pharmacy Consult for hypertonic saline ?Indication: hyponatremia ? ?No Known Allergies ? ?Patient Measurements: ?Height: 5\' 9"  (175.3 cm) ?IBW/kg (Calculated) : 70.7 ? ? ?Vital Signs: ?Temp: 97.7 ?F (36.5 ?C) (03/02 0348) ?BP: 134/57 (03/02 0348) ?Pulse Rate: 63 (03/02 0348) ?Intake/Output from previous day: ?03/01 0701 - 03/02 0700 ?In: 128.7 [I.V.:128.7] ?Out: 500 [Urine:500] ?Intake/Output from this shift: ?Total I/O ?In: 128.7 [I.V.:128.7] ?Out: -  ? ?Labs: ?Recent Labs  ?  06/10/21 ?1512 06/10/21 ?1542 06/10/21 ?2122 06/11/21 ?0030 06/11/21 ?0200  ?WBC 14.8*  --   --   --  8.3  ?HGB 13.4  --   --   --  12.1*  ?HCT 35.9*  --   --   --  32.3*  ?PLT 253  --   --   --  214  ?CREATININE 0.48*  --  0.45* 0.40* 0.43*  ?LABCREA  --  79  --   --   --   ?MG  --   --   --   --  1.8  ?ALBUMIN 3.7  --  3.1*  --  3.3*  ?PROT 6.3*  --  5.3*  --  5.5*  ?AST 106*  --  95*  --  102*  ?ALT 52*  --  43  --  48*  ?ALKPHOS 81  --  65  --  67  ?BILITOT 1.4*  --  0.9  --  1.1  ? ? ?Estimated Creatinine Clearance: 62.7 mL/min (A) (by C-G formula based on SCr of 0.43 mg/dL (L)).  ? ?Microbiology: ?Recent Results (from the past 720 hour(s))  ?Resp Panel by RT-PCR (Flu A&B, Covid) Nasopharyngeal Swab     Status: None  ? Collection Time: 06/10/21  4:41 PM  ? Specimen: Nasopharyngeal Swab; Nasopharyngeal(NP) swabs in vial transport medium  ?Result Value Ref Range Status  ? SARS Coronavirus 2 by RT PCR NEGATIVE NEGATIVE Final  ?  Comment: (NOTE) ?SARS-CoV-2 target nucleic acids are NOT DETECTED. ? ?The SARS-CoV-2 RNA is generally detectable in upper respiratory ?specimens during the acute phase of infection. The lowest ?concentration of SARS-CoV-2 viral copies this assay can detect is ?138 copies/mL. A negative result does not preclude SARS-Cov-2 ?infection and should not be used as the sole basis for treatment or ?other patient management decisions. A negative result may occur with  ?improper specimen  collection/handling, submission of specimen other ?than nasopharyngeal swab, presence of viral mutation(s) within the ?areas targeted by this assay, and inadequate number of viral ?copies(<138 copies/mL). A negative result must be combined with ?clinical observations, patient history, and epidemiological ?information. The expected result is Negative. ? ?Fact Sheet for Patients:  ?EntrepreneurPulse.com.au ? ?Fact Sheet for Healthcare Providers:  ?IncredibleEmployment.be ? ?This test is no t yet approved or cleared by the [place] FDA and  ?has been authorized for detection and/or diagnosis of SARS-CoV-2 by ?FDA under an Emergency Use Authorization (EUA). This EUA will remain  ?in effect (meaning this test can be used) for the duration of the ?COVID-19 declaration under Section 564(b)(1) of the Act, 21 ?U.S.C.section 360bbb-3(b)(1), unless the authorization is terminated  ?or revoked sooner.  ? ? ?  ? Influenza A by PCR NEGATIVE NEGATIVE Final  ? Influenza B by PCR NEGATIVE NEGATIVE Final  ?  Comment: (NOTE) ?The Xpert Xpress SARS-CoV-2/FLU/RSV plus assay is intended as an aid ?in the diagnosis of influenza from Nasopharyngeal swab specimens and ?should not be used as a sole basis for treatment. Nasal washings  and ?aspirates are unacceptable for Xpert Xpress SARS-CoV-2/FLU/RSV ?testing. ? ?Fact Sheet for Patients: ?EntrepreneurPulse.com.au ? ?Fact Sheet for Healthcare Providers: ?IncredibleEmployment.be ? ?This test is not yet approved or cleared by the [place] FDA and ?has been authorized for detection and/or diagnosis of SARS-CoV-2 by ?FDA under an Emergency Use Authorization (EUA). This EUA will remain ?in effect (meaning this test can be used) for the duration of the ?COVID-19 declaration under Section 564(b)(1) of the Act, 21 U.S.C. ?section 360bbb-3(b)(1), unless the authorization is terminated or ?revoked. ? ?Performed at [facility], [place], ?[place] [address] ?  ? ? ?Medications:  ?Medications Prior to Admission  ?Medication Sig Dispense Refill Last Dose  ? ASPIRIN 81 PO Take by mouth daily.   06/09/2021  ? benazepril (LOTENSIN) 20 MG tablet Take 20 mg by mouth daily.   06/09/2021  ? finasteride (PROSCAR) 5 MG tablet Take 5 mg by mouth daily.   06/09/2021  ? insulin glargine (LANTUS) 100 UNIT/ML injection Inject 16 Units into the skin daily. evening   06/09/2021 at 1200  ? MELATONIN PO Take by mouth at bedtime as needed.   06/09/2021  ? metFORMIN (GLUMETZA) 500 MG (MOD) 24 hr tablet Take 500 mg by mouth daily. With supper   06/09/2021  ? NOVOLOG FLEXPEN 100 UNIT/ML FlexPen Inject 25 Units into the skin daily.   06/09/2021  ? Omega-3 Fatty Acids (FISH OIL PO) Take by mouth daily.   06/09/2021  ? PARoxetine (PAXIL) 10 MG tablet Take 10 mg by mouth daily.   06/09/2021  ? pravastatin (PRAVACHOL) 20 MG tablet Take 20 mg by mouth daily.   06/09/2021  ? tamsulosin (FLOMAX) 0.4 MG CAPS capsule Take 0.4 mg by mouth.   06/09/2021  ? traMADol (ULTRAM) 50 MG tablet Take 1 tablet (50 mg total) by mouth every 6 (six) hours as needed for up to 5 days. 15 tablet 0 prn at prn  ? Multiple Vitamin (MULTIVITAMIN PO) Take by mouth daily.     ? ?Scheduled:  ? enoxaparin (LOVENOX) injection  40 mg Subcutaneous Daily  ? insulin aspart  0-5 Units Subcutaneous QHS  ? insulin aspart  0-9 Units Subcutaneous TID WC  ? sodium chloride flush  3 mL Intravenous Q12H  ? ?Infusions:  ? cefTRIAXone (ROCEPHIN)  IV Stopped (06/10/21 1853)  ? ?PRN: acetaminophen **OR** acetaminophen ?Anti-infectives (From admission, onward)  ? ? Start     Dose/Rate Route Frequency Ordered Stop  ? 06/10/21 1800  cefTRIAXone (ROCEPHIN) 1 g in sodium chloride 0.9 % 100 mL IVPB       ? 1 g ?200 mL/hr over 30 Minutes Intravenous Every 24 hours 06/10/21 1735    ? ?  ? ? ?Assessment: ?80YOM presenting with confusion. PMH includes T2DM, hard of hearing, HTN, HLD. Na 112 on presentation.  (BL Na 124 on 05/29/21), then on repeat Na 113. 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 ? ?Goal of Therapy:  ?Increase in Na by 4-6 mEq/L in 4-6 hours ? ?Plan: Monitor Na q2h x 2 occurrences, then q4h ?Call RN to stop infusion and notify MD if Na increases by 4 mEq/L or more in the first 2 hours or Na increases by 6 mEq/L or more in the first 4 hour ?Caution for increase of 10-12 mEq/L in 24 hours ? ?3/1:  Na @1512  =  112 ?3/1:  Na @1720  =  113 (baseline prior to hypertonic saline) ?3/1:  Na @ 2122 = 118 ?3/2:  Na @ 0030 = 121 (8 mEq rise in Na in 7 hrs)  ?        -  notified NP of increase, hypertonic saline  ?           d/c'd @ 0130.  ?3/2:  Na @ 0200 = 120  ?3/2:  Na @ 0600 = 122 , MD aware , will start D5W if Na continues to  ?            increase ? ? ?Saul Dorsi D, PharmD ?06/11/2021 ?6:32 AM ? ? ? ? ?

## 2021-06-12 DIAGNOSIS — N3000 Acute cystitis without hematuria: Secondary | ICD-10-CM | POA: Diagnosis not present

## 2021-06-12 DIAGNOSIS — R7989 Other specified abnormal findings of blood chemistry: Secondary | ICD-10-CM | POA: Diagnosis not present

## 2021-06-12 DIAGNOSIS — E871 Hypo-osmolality and hyponatremia: Secondary | ICD-10-CM | POA: Diagnosis not present

## 2021-06-12 LAB — COMPREHENSIVE METABOLIC PANEL
ALT: 50 U/L — ABNORMAL HIGH (ref 0–44)
AST: 79 U/L — ABNORMAL HIGH (ref 15–41)
Albumin: 3.3 g/dL — ABNORMAL LOW (ref 3.5–5.0)
Alkaline Phosphatase: 67 U/L (ref 38–126)
Anion gap: 8 (ref 5–15)
BUN: 9 mg/dL (ref 8–23)
CO2: 25 mmol/L (ref 22–32)
Calcium: 8.2 mg/dL — ABNORMAL LOW (ref 8.9–10.3)
Chloride: 91 mmol/L — ABNORMAL LOW (ref 98–111)
Creatinine, Ser: 0.5 mg/dL — ABNORMAL LOW (ref 0.61–1.24)
GFR, Estimated: >60 mL/min (ref >60)
Glucose, Bld: 188 mg/dL — ABNORMAL HIGH (ref 70–99)
Potassium: 3.6 mmol/L (ref 3.5–5.1)
Sodium: 124 mmol/L — ABNORMAL LOW (ref 135–145)
Total Bilirubin: 1.2 mg/dL (ref 0.3–1.2)
Total Protein: 5.6 g/dL — ABNORMAL LOW (ref 6.5–8.1)

## 2021-06-12 LAB — GLUCOSE, CAPILLARY
Glucose-Capillary: 191 mg/dL — ABNORMAL HIGH (ref 70–99)
Glucose-Capillary: 178 mg/dL — ABNORMAL HIGH (ref 70–99)
Glucose-Capillary: 247 mg/dL — ABNORMAL HIGH (ref 70–99)
Glucose-Capillary: 162 mg/dL — ABNORMAL HIGH (ref 70–99)

## 2021-06-12 LAB — CBC WITH DIFFERENTIAL/PLATELET
Abs Immature Granulocytes: 0.06 10*3/uL (ref 0.00–0.07)
Basophils Absolute: 0 10*3/uL (ref 0.0–0.1)
Basophils Relative: 0 %
Eosinophils Absolute: 0 10*3/uL (ref 0.0–0.5)
Eosinophils Relative: 1 %
HCT: 34.1 % — ABNORMAL LOW (ref 39.0–52.0)
Hemoglobin: 12.7 g/dL — ABNORMAL LOW (ref 13.0–17.0)
Immature Granulocytes: 1 %
Lymphocytes Relative: 8 %
Lymphs Abs: 0.7 10*3/uL (ref 0.7–4.0)
MCH: 35.3 pg — ABNORMAL HIGH (ref 26.0–34.0)
MCHC: 37.2 g/dL — ABNORMAL HIGH (ref 30.0–36.0)
MCV: 94.7 fL (ref 80.0–100.0)
Monocytes Absolute: 0.9 10*3/uL (ref 0.1–1.0)
Monocytes Relative: 12 %
Neutro Abs: 6.4 10*3/uL (ref 1.7–7.7)
Neutrophils Relative %: 78 %
Platelets: 215 10*3/uL (ref 150–400)
RBC: 3.6 MIL/uL — ABNORMAL LOW (ref 4.22–5.81)
RDW: 11.1 % — ABNORMAL LOW (ref 11.5–15.5)
WBC: 8.1 10*3/uL (ref 4.0–10.5)
nRBC: 0 % (ref 0.0–0.2)

## 2021-06-12 LAB — SODIUM
Sodium: 123 mmol/L — ABNORMAL LOW (ref 135–145)
Sodium: 123 mmol/L — ABNORMAL LOW (ref 135–145)
Sodium: 124 mmol/L — ABNORMAL LOW (ref 135–145)

## 2021-06-12 LAB — MAGNESIUM: Magnesium: 1.7 mg/dL (ref 1.7–2.4)

## 2021-06-12 MED ORDER — BENAZEPRIL HCL 20 MG PO TABS
20.0000 mg | ORAL_TABLET | Freq: Every day | ORAL | Status: DC
Start: 2021-06-12 — End: 2021-06-17
  Administered 2021-06-12 – 2021-06-17 (×6): 20 mg via ORAL
  Filled 2021-06-12 (×6): qty 1

## 2021-06-12 MED ORDER — LACTATED RINGERS IV SOLN: INTRAVENOUS | Status: DC

## 2021-06-12 MED ORDER — PAROXETINE HCL 10 MG PO TABS
10.0000 mg | ORAL_TABLET | Freq: Every day | ORAL | Status: DC
Start: 2021-06-12 — End: 2021-06-17
  Administered 2021-06-12 – 2021-06-17 (×6): 10 mg via ORAL
  Filled 2021-06-12 (×6): qty 1

## 2021-06-12 MED ORDER — ASPIRIN 81 MG PO CHEW
81.0000 mg | CHEWABLE_TABLET | Freq: Every day | ORAL | Status: DC
  Administered 2021-06-12 – 2021-06-17 (×6): 81 mg via ORAL
  Filled 2021-06-12 (×6): qty 1

## 2021-06-12 MED ORDER — TAMSULOSIN HCL 0.4 MG PO CAPS
0.4000 mg | ORAL_CAPSULE | Freq: Every day | ORAL | Status: DC
  Administered 2021-06-12 – 2021-06-17 (×6): 0.4 mg via ORAL
  Filled 2021-06-12 (×6): qty 1

## 2021-06-12 MED ORDER — FINASTERIDE 5 MG PO TABS
5.0000 mg | ORAL_TABLET | Freq: Every day | ORAL | Status: DC
Start: 2021-06-12 — End: 2021-06-17
  Administered 2021-06-12 – 2021-06-17 (×6): 5 mg via ORAL
  Filled 2021-06-12 (×6): qty 1

## 2021-06-12 MED ORDER — METFORMIN HCL 500 MG PO TABS
1000.0000 mg | ORAL_TABLET | Freq: Two times a day (BID) | ORAL | Status: DC
  Administered 2021-06-12 – 2021-06-17 (×10): 1000 mg via ORAL
  Filled 2021-06-12 (×11): qty 2

## 2021-06-12 NOTE — Progress Notes (Signed)
?  RD consulted for nutrition education regarding diabetes.  ? ?Lab Results  ?Component Value Date  ? HGBA1C 6.6 (H) 06/10/2021  ? ?Pt unavailable at time of visit.  ? ?RD provided "Carbohydrate Counting for People with Diabetes" handout from the Academy of Nutrition and Dietetics.  ? ?Attached to AVS/ discharge summary. Also referred pt to [facility]'s Nutrition and Diabetes Education Services.  ? ?Current diet order is carb modified, patient is consuming approximately 50-100% of meals at this time. Labs and medications reviewed. No further nutrition interventions warranted at this time. RD contact information provided. If additional nutrition issues arise, please re-consult RD. ? ?Loistine Chance, RD, LDN, CDCES ?Registered Dietitian II ?Certified Diabetes Care and Education Specialist ?Please refer to [facility] for RD and/or RD on-call/weekend/after hours pager   ?

## 2021-06-12 NOTE — Progress Notes (Signed)
Physical Therapy Treatment ?Patient Details ?Name: John Perez ?MRN: [id number] ?DOB: 11-05-1940 ?Today's Date: 06/12/2021 ? ? ?History of Present Illness John Perez is an 81 yo male with PMH DM II, hard of hearing, HTN, HLD. ? ?  ?PT Comments  ? ? Pt received seated in recliner upon arrival to room and pt agreeable to therapy.  Pt and family endorse pt ambulating 10 laps around the nursing station today prior to session.  Due to ability to mobilize independently, pt and family members requesting HEP to prevent pt from getting stiff.  Pt given both supine and sitting exercises as noted below.  Pt able to perform with good technique, although sometimes needs the visual cuing to perform technique correctly.  Pt's family also educated on the use of the website for video instructions.  Current discharge plans to home with no HHPT remain appropriate at this time.  Pt will continue to benefit from skilled therapy in order to address deficits listed below. ? ?  ?Recommendations for follow up therapy are one component of a multi-disciplinary discharge planning process, led by the attending physician.  Recommendations may be updated based on patient status, additional functional criteria and insurance authorization. ? ?Follow Up Recommendations ? No PT follow up ?  ?  ?Assistance Recommended at Discharge None  ?Patient can return home with the following   ?  ?Equipment Recommendations ? Rolling walker (2 wheels)  ?  ?Recommendations for Other Services   ? ? ?  ?Precautions / Restrictions Restrictions ?Weight Bearing Restrictions: No  ?  ? ?Mobility ? Bed Mobility ?  ?  ?  ?  ?  ?  ?  ?General bed mobility comments: pt upright in recliner upon arrival to room. ?  ? ?Transfers ?Overall transfer level: Needs assistance ?Equipment used: None ?Transfers: Sit to/from Stand ?Sit to Stand: Supervision ?  ?  ?  ?  ?  ?General transfer comment: pt has been performing mobility around the nursing station with supervision only, but feels like  he is still getting stiff while lying in the bed. ?  ? ?Ambulation/Gait ?  ?  ?  ?  ?Gait velocity: decreased ?  ?  ?General Gait Details: Deferred walking due to already having done so, and pt requesting increased exercises for increased mobility/strengthening ? ? ?Stairs ?  ?Stairs assistance: Min guard ?Stair Management: One rail Right, One rail Left, Two rails, Alternating pattern, Step to pattern, Forwards ?  ?  ? ? ?Wheelchair Mobility ?  ? ?Modified Rankin (Stroke Patients Only) ?  ? ? ?  ?Balance Overall balance assessment: Needs assistance ?Sitting-balance support: No upper extremity supported, Feet supported ?Sitting balance-Leahy Scale: Normal ?  ?  ?Standing balance support: No upper extremity supported, During functional activity ?Standing balance-Leahy Scale: Good ?  ?  ?  ?  ?  ?  ?  ?  ?  ?  ?  ?  ?  ? ?  ?Cognition Arousal/Alertness: Awake/alert ?Behavior During Therapy: John Perez for tasks assessed/performed ?Overall Cognitive Status: Within Functional Limits for tasks assessed ?  ?  ?  ?  ?  ?  ?  ?  ?  ?  ?  ?  ?  ?  ?  ?  ?  ?  ?  ? ?  ?Exercises Other Exercises ?Other Exercises: Access Code: [email or web address]  URL: [email or web address]  Date: 06/12/2021  Prepared by: John Perez    Exercises  Seated March - 15 reps  Seated Hip Adduction Isometrics  with Pillow - 15 reps  Seated Long Arc Quad - 15 reps  Seated Shoulder Flexion Full Range - 15 reps  Seated Shoulder Circles - 15 reps  Seated Scapular Retraction - 15 reps  Seated Heel Toe Raises - 15 reps ?Other Exercises: Pt's family members also given exercise packet and went over supine LE exercises as well:  Access Code: [email or web address]  URL: [email or web address]  Date: 06/12/2021  Prepared by: John Perez    Exercises  Supine Ankle Pumps - 15 reps  Supine Quadricep Sets - 15 reps  Supine Isometric Hip Adduction with Pillow at Knees - 15 reps  Supine Gluteal Sets - 15 reps  Supine Heel Slides - 15 reps  Supine Hip Abduction - 15 reps   Supine Active Straight Leg Raise - 15 reps  Supine Short Arc Quad - 15 reps ? ?  ?General Comments   ?  ?  ? ?Pertinent Vitals/Pain Pain Assessment ?Pain Assessment: No/denies pain  ? ? ?Home Living   ?  ?  ?  ?  ?  ?  ?  ?  ?  ?   ?  ?Prior Function    ?  ?  ?   ? ?PT Goals (current goals can now be found in the care plan section) Acute Rehab PT Goals ?Patient Stated Goal: to go home ?PT Goal Formulation: With patient/family ?Time For Goal Achievement: 06/25/21 ?Potential to Achieve Goals: Good ?Progress towards PT goals: Progressing toward goals ? ?  ?Frequency ? ? ? Min 2X/week ? ? ? ?  ?PT Plan Current plan remains appropriate  ? ? ?Co-evaluation   ?  ?  ?  ?  ? ?  ?AM-PAC PT "6 Clicks" Mobility   ?Outcome Measure ? Help needed turning from your back to your side while in a flat bed without using bedrails?: None ?Help needed moving from lying on your back to sitting on the side of a flat bed without using bedrails?: None ?Help needed moving to and from a bed to a chair (including a wheelchair)?: None ?Help needed standing up from a chair using your arms (e.g., wheelchair or bedside chair)?: None ?Help needed to walk in hospital room?: None ?Help needed climbing 3-5 steps with a railing? : None ?6 Click Score: 24 ? ?  ?End of Session   ?Activity Tolerance: Patient tolerated treatment well ?Patient left: with call bell/phone within reach;in chair;with family/visitor present ?Nurse Communication: Mobility status ?PT Visit Diagnosis: Unsteadiness on feet (R26.81);Muscle weakness (generalized) (M62.81) ?  ? ? ?Time: 0277-4128 ?PT Time Calculation (min) (ACUTE ONLY): 38 min ? ?Charges:  $Therapeutic Exercise: 38-52 mins          ?          ? ?John Perez, PT, DPT ?06/12/21, 3:40 PM ? ? ? ?John Perez ?06/12/2021, 3:34 PM ? ?

## 2021-06-12 NOTE — Care Management Important Message (Signed)
Important Message ? ?Patient Details  ?Name: John Perez ?MRN: [id number] ?Date of Birth: Aug 10, 1940 ? ? ?Medicare Important Message Given:  Yes ? ? ? ? ?[name] ?06/12/2021, 11:43 AM ?

## 2021-06-12 NOTE — Progress Notes (Signed)
Progress Note   Patient: John Perez WER:[id number] DOB: 21-Mar-1941 DOA: 06/10/2021     2 DOS: the patient was seen and examined on 06/12/2021   Brief hospital course: Mr. John Perez is an 81 yo male with PMH DM II, hard of hearing, HTN, HLD who presented to the ER today with worsening confusion at home. He was also seen in the ER on 06/09/2021 after a fall at home when walking down the steps.  It was reported that he lost his balance and fell forward. Head, neck, and shoulder imaging were negative for acute fractures. Head CT did show a 3.5 cm focus of low density in the left parietal lobe which was further evaluated with MRI brain and was noted to be hyperintense on T2 weighted signal which may suggest a possible low-grade neoplasm.  It was recommended to have repeat MRI brain in 6 to 12 weeks. No labs were ordered during that encounter.  Today when seen in the ER for his confusion he underwent further work-up and was found to have sodium 112. Other notable labs included BUN 16, creatinine 0.48, AST 106, ALT 52, total bili 1.4. WBC 14.8, hemoglobin 13.4 g/dL, hematocrit 35.9, platelets 253.  He was given a 1 L bag of saline in the ER.  Urinalysis was also obtained in the ER which showed large LE, greater than 50 WBC, many bacteria.  Assessment and Plan: * Hyponatremia - Na 112 on presentation; labs reviewed on care everywhere lowest recently seen was 124 on 05/29/2021 otherwise has been essentially over 130 historically - wife is confident that patient does not drink etoh. Home meds reviewed, he was recently started on Paxil after an office visit on 06/05/21 although would not expect adverse effect this rapidly. Other considered differentials would be excessive water intake vs the density in the parietal lobe concerning for possible malignancy as this could cause an SIADH hyponatremia picture vs a hormone abnormality (TSH, adrenals) - TSH is normal. Low suspicion for adrenal pathology as well - patient  continues to endorse he drinks excessive free water due to worrying about his diabetes being controlled; this is likely polydipsia in this context - FeNa also 0.1% - started on 3% NS on admission with rapid correction so discontinued -Sodium has continued to slowly improve with some autoregulation - Starting on LR today and will continue to trend sodium levels -Mentation has been slowly improving  UTI (urinary tract infection) - UA noted with negative nitrite, large LE, greater than 50 WBC, many bacteria -Urine culture growing Serratia, sensitivities pending - Rocephin should cover, continue  Abnormal LFTs - unclear etiology; possibly from cholestasis from infection  -Hepatitis panel negative - continue trending LFTs  Abnormal brain MRI - MRI brain on 2/28 shows ~3.5 cm hyperintense abnormal T2 weighted signal concerning for low-grade neoplasm -Repeat MRI brain was recommended in 6 to 12 weeks but pending above work-up, may need sooner evaluation but will decide as hyponatremia further resolves  Diabetes mellitus type 2, insulin dependent (HCC) - A1c 6.6% - Patient is on Lantus and sliding scale at home.  He also follow strict dietary adherence to a carb consistent diet. -His A1c is essentially at goal and I would suspect he does not need basal insulin anymore possibly nor even sliding scale -His metformin dose at home is 500 mg twice daily.  This could be considered to be increased and lieu of discontinuing insulin even; I have discussed this bedside with patient and his daughter.  We will discuss this  further prior to discharge as well -He has barely been needed any sliding scale while hospitalized; and his home basal insulin has been on hold as well      Subjective: No events overnight.  Patient walked several laps around the nursing unit this morning and did very well.  Daughter present bedside this morning and states mentation has also been slowly improving.. Reviewed A1c results  and we had some discussion about him possibly not needing insulin at discharge.  We will discuss further tomorrow.   Physical Exam: Vitals:   06/12/21 0748 06/12/21 0927 06/12/21 1147 06/12/21 1541  BP: (!) 170/79 (!) 150/75 (!) 145/76 140/70  Pulse: 62 68 65 70  Resp: 16  16 18   Temp: (!) 97.5 F (36.4 C)  (!) 97.5 F (36.4 C) 98.4 F (36.9 C)  TempSrc: Oral  Oral Oral  SpO2: 99%  99% 100%  Height:       Physical Exam Constitutional:      General: He is not in acute distress.    Comments: Confusion mildly present but has continued to improve throughout hospitalization  HENT:     Head: Normocephalic and atraumatic.     Mouth/Throat:     Mouth: Mucous membranes are moist.  Eyes:     Extraocular Movements: Extraocular movements intact.  Cardiovascular:     Rate and Rhythm: Normal rate and regular rhythm.     Heart sounds: Normal heart sounds.  Pulmonary:     Effort: Pulmonary effort is normal. No respiratory distress.     Breath sounds: Normal breath sounds. No wheezing.  Abdominal:     General: Bowel sounds are normal. There is no distension.     Palpations: Abdomen is soft.     Tenderness: There is no abdominal tenderness.  Musculoskeletal:        General: Normal range of motion.     Cervical back: Normal range of motion and neck supple.  Skin:    General: Skin is warm and dry.  Neurological:     Mental Status: He is alert.     Comments: Oriented to name, place, and president and situation.  Still does not know year  Psychiatric:     Comments: Organization of thought process is also improving     Data Reviewed: Results for orders placed or performed during the hospital encounter of 06/10/21 (from the past 24 hour(s))  Glucose, capillary     Status: Abnormal   Collection Time: 06/11/21  4:36 PM  Result Value Ref Range   Glucose-Capillary 219 (H) 70 - 99 mg/dL  Sodium     Status: Abnormal   Collection Time: 06/11/21  8:06 PM  Result Value Ref Range   Sodium  122 (L) 135 - 145 mmol/L  Glucose, capillary     Status: Abnormal   Collection Time: 06/11/21  9:51 PM  Result Value Ref Range   Glucose-Capillary 165 (H) 70 - 99 mg/dL  Sodium     Status: Abnormal   Collection Time: 06/11/21 11:47 PM  Result Value Ref Range   Sodium 123 (L) 135 - 145 mmol/L  CBC with Differential/Platelet     Status: Abnormal   Collection Time: 06/12/21  5:19 AM  Result Value Ref Range   WBC 8.1 4.0 - 10.5 K/uL   RBC 3.60 (L) 4.22 - 5.81 MIL/uL   Hemoglobin 12.7 (L) 13.0 - 17.0 g/dL   HCT 34.1 (L) 39.0 - 52.0 %   MCV 94.7 80.0 - 100.0 fL  MCH 35.3 (H) 26.0 - 34.0 pg   MCHC 37.2 (H) 30.0 - 36.0 g/dL   RDW 11.1 (L) 11.5 - 15.5 %   Platelets 215 150 - 400 K/uL   nRBC 0.0 0.0 - 0.2 %   Neutrophils Relative % 78 %   Neutro Abs 6.4 1.7 - 7.7 K/uL   Lymphocytes Relative 8 %   Lymphs Abs 0.7 0.7 - 4.0 K/uL   Monocytes Relative 12 %   Monocytes Absolute 0.9 0.1 - 1.0 K/uL   Eosinophils Relative 1 %   Eosinophils Absolute 0.0 0.0 - 0.5 K/uL   Basophils Relative 0 %   Basophils Absolute 0.0 0.0 - 0.1 K/uL   Immature Granulocytes 1 %   Abs Immature Granulocytes 0.06 0.00 - 0.07 K/uL  Magnesium     Status: None   Collection Time: 06/12/21  5:19 AM  Result Value Ref Range   Magnesium 1.7 1.7 - 2.4 mg/dL  Comprehensive metabolic panel     Status: Abnormal   Collection Time: 06/12/21  5:19 AM  Result Value Ref Range   Sodium 124 (L) 135 - 145 mmol/L   Potassium 3.6 3.5 - 5.1 mmol/L   Chloride 91 (L) 98 - 111 mmol/L   CO2 25 22 - 32 mmol/L   Glucose, Bld 188 (H) 70 - 99 mg/dL   BUN 9 8 - 23 mg/dL   Creatinine, Ser 0.50 (L) 0.61 - 1.24 mg/dL   Calcium 8.2 (L) 8.9 - 10.3 mg/dL   Total Protein 5.6 (L) 6.5 - 8.1 g/dL   Albumin 3.3 (L) 3.5 - 5.0 g/dL   AST 79 (H) 15 - 41 U/L   ALT 50 (H) 0 - 44 U/L   Alkaline Phosphatase 67 38 - 126 U/L   Total Bilirubin 1.2 0.3 - 1.2 mg/dL   GFR, Estimated >60 >60 mL/min   Anion gap 8 5 - 15  Glucose, capillary     Status:  Abnormal   Collection Time: 06/12/21  7:49 AM  Result Value Ref Range   Glucose-Capillary 191 (H) 70 - 99 mg/dL  Glucose, capillary     Status: Abnormal   Collection Time: 06/12/21 11:46 AM  Result Value Ref Range   Glucose-Capillary 178 (H) 70 - 99 mg/dL  Sodium     Status: Abnormal   Collection Time: 06/12/21 12:15 PM  Result Value Ref Range   Sodium 124 (L) 135 - 145 mmol/L  Glucose, capillary     Status: Abnormal   Collection Time: 06/12/21  3:39 PM  Result Value Ref Range   Glucose-Capillary 247 (H) 70 - 99 mg/dL    I have Reviewed nursing notes, Vitals, and Lab results since pt's last encounter. Pertinent lab results : see above I have ordered test including BMP, CBC, Mg I have reviewed the last note from staff over past 24 hours I have discussed pt's care plan and test results with nursing staff, case manager   Family Communication: daughter   Disposition: Status is: Inpatient Remains inpatient appropriate because: Treatment as outlined in A&P     Planned Discharge Destination: Home  Antimicrobials: Rocephin 3/2 >> current  Consultants:   Procedures:    DVT ppx:  enoxaparin (LOVENOX) injection 40 mg Start: 06/11/21 1000     Code Status: Full Code    Author: Dwyane Dee, MD 06/12/2021 4:32 PM  For on call review [email or web address].

## 2021-06-12 NOTE — Discharge Instructions (Signed)
Plate Method for Diabetes   Foods with carbohydrates make your blood glucose level go up. The plate method is a simple way to meal plan and control the amount of carbohydrate you eat.         Use the following guidance to build a healthy plate to control carbohydrates. Divide a 9-inch plate into 3 sections, and consider your beverage the 4th section of your meal: Food Group Examples of Foods/Beverages for This Section of your Meal  Section 1: Non-starchy vegetables Fill  of your plate to include non-starchy vegetables Asparagus, broccoli, brussels sprouts, cabbage, carrots, cauliflower, celery, cucumber, green beans, mushrooms, peppers, salad greens, tomatoes, or zucchini.  Section 2: Protein foods Fill  of your plate to include a lean protein Lean meat, poultry, fish, seafood, cheese, eggs, lean deli meat, tofu, beans, lentils, nuts or nut butters.  Section 3: Carbohydrate foods Fill  of your plate to include carbohydrate foods Whole grains, whole wheat bread, brown rice, whole grain pasta, polenta, corn tortillas, fruit, or starchy vegetables (potatoes, green peas, corn, beans, acorn squash, and butternut squash). One cup of milk also counts as a food that contains carbohydrate.  Section 4: Beverage Choose water or a low-calorie drink for your beverage. Unsweetened tea, coffee, or flavored/sparkling water without added sugar.  Image reprinted with permission from The American Diabetes Association.  Copyright 2022 by the American Diabetes Association.   Copyright 2022  Academy of Nutrition and Dietetics. All rights reserved    Carbohydrate Counting For People With Diabetes  Foods with carbohydrates make your blood glucose level go up. Learning how to count carbohydrates can help you control your blood glucose levels. First, identify the foods you eat that contain carbohydrates. Then, using the Foods with Carbohydrates chart, determine about how much carbohydrates are in your meals and  snacks. Make sure you are eating foods with fiber, protein, and healthy fat along with your carbohydrate foods. Foods with Carbohydrates The following table shows carbohydrate foods that have about 15 grams of carbohydrate each. Using measuring cups, spoons, or a food scale when you first begin learning about carbohydrate counting can help you learn about the portion sizes you typically eat. The following foods have 15 grams carbohydrate each:  Grains 1 slice bread (1 ounce)  1 small tortilla (6-inch size)   large bagel (1 ounce)  1/3 cup pasta or rice (cooked)   hamburger or hot dog bun ( ounce)   cup cooked cereal   to  cup ready-to-eat cereal  2 taco shells (5-inch size) Fruit 1 small fresh fruit ( to 1 cup)   medium banana  17 small grapes (3 ounces)  1 cup melon or berries   cup canned or frozen fruit  2 tablespoons dried fruit (blueberries, cherries, cranberries, raisins)   cup unsweetened fruit juice  Starchy Vegetables  cup cooked beans, peas, corn, potatoes/sweet potatoes   large baked potato (3 ounces)  1 cup acorn or butternut squash  Snack Foods 3 to 6 crackers  8 potato chips or 13 tortilla chips ( ounce to 1 ounce)  3 cups popped popcorn  Dairy 3/4 cup (6 ounces) nonfat plain yogurt, or yogurt with sugar-free sweetener  1 cup milk  1 cup plain rice, soy, coconut or flavored almond milk Sweets and Desserts  cup ice cream or frozen yogurt  1 tablespoon jam, jelly, pancake syrup, table sugar, or honey  2 tablespoons light pancake syrup  1 inch square of frosted cake or 2 inch square of  unfrosted cake  2 small cookies (2/3 ounce each) or  large cookie  Sometimes youll have to estimate carbohydrate amounts if you dont know the exact recipe. One cup of mixed foods like soups can have 1 to 2 carbohydrate servings, while some casseroles might have 2 or more servings of carbohydrate. Foods that have less than 20 calories in each serving can be counted as  free foods. Count 1 cup raw vegetables, or  cup cooked non-starchy vegetables as free foods. If you eat 3 or more servings at one meal, then count them as 1 carbohydrate serving.  Foods without Carbohydrates  Not all foods contain carbohydrates. Meat, some dairy, fats, non-starchy vegetables, and many beverages dont contain carbohydrate. So when you count carbohydrates, you can generally exclude chicken, pork, beef, fish, seafood, eggs, tofu, cheese, butter, sour cream, avocado, nuts, seeds, olives, mayonnaise, water, black coffee, unsweetened tea, and zero-calorie drinks. Vegetables with no or low carbohydrate include green beans, cauliflower, tomatoes, and onions. How much carbohydrate should I eat at each meal?  Carbohydrate counting can help you plan your meals and manage your weight. Following are some starting points for carbohydrate intake at each meal. Work with your registered dietitian nutritionist to find the best range that works for your blood glucose and weight.   To Lose Weight To Maintain Weight  Women 2 - 3 carb servings 3 - 4 carb servings  Men 3 - 4 carb servings 4 - 5 carb servings  Checking your blood glucose after meals will help you know if you need to adjust the timing, type, or number of carbohydrate servings in your meal plan. Achieve and keep a healthy body weight by balancing your food intake and physical activity.  Tips How should I plan my meals?  Plan for half the food on your plate to include non-starchy vegetables, like salad greens, broccoli, or carrots. Try to eat 3 to 5 servings of non-starchy vegetables every day. Have a protein food at each meal. Protein foods include chicken, fish, meat, eggs, or beans (note that beans contain carbohydrate). These two food groups (non-starchy vegetables and proteins) are low in carbohydrate. If you fill up your plate with these foods, you will eat less carbohydrate but still fill up your stomach. Try to limit your carbohydrate  portion to  of the plate.  What fats are healthiest to eat?  Diabetes increases risk for heart disease. To help protect your heart, eat more healthy fats, such as olive oil, nuts, and avocado. Eat less saturated fats like butter, cream, and high-fat meats, like bacon and sausage. Avoid trans fats, which are in all foods that list partially hydrogenated oil as an ingredient. What should I drink?  Choose drinks that are not sweetened with sugar. The healthiest choices are water, carbonated or seltzer waters, and tea and coffee without added sugars.  Sweet drinks will make your blood glucose go up very quickly. One serving of soda or energy drink is  cup. It is best to drink these beverages only if your blood glucose is low.  Artificially sweetened, or diet drinks, typically do not increase your blood glucose if they have zero calories in them. Read labels of beverages, as some diet drinks do have carbohydrate and will raise your blood glucose. Label Reading Tips Read Nutrition Facts labels to find out how many grams of carbohydrate are in a food you want to eat. Dont forget: sometimes serving sizes on the label arent the same as how much food  you are going to eat, so you may need to calculate how much carbohydrate is in the food you are serving yourself.   Carbohydrate Counting for People with Diabetes Sample 1-Day Menu  Breakfast  cup yogurt, low fat, low sugar (1 carbohydrate serving)   cup cereal, ready-to-eat, unsweetened (1 carbohydrate serving)  1 cup strawberries (1 carbohydrate serving)   cup almonds ( carbohydrate serving)  Lunch 1, 5 ounce can chunk light tuna  2 ounces cheese, low fat cheddar  6 whole wheat crackers (1 carbohydrate serving)  1 small apple (1 carbohydrate servings)   cup carrots ( carbohydrate serving)   cup snap peas  1 cup 1% milk (1 carbohydrate serving)   Evening Meal Stir fry made with: 3 ounces chicken  1 cup brown rice (3 carbohydrate servings)    cup broccoli ( carbohydrate serving)   cup green beans   cup onions  1 tablespoon olive oil  2 tablespoons teriyaki sauce ( carbohydrate serving)  Evening Snack 1 extra small banana (1 carbohydrate serving)  1 tablespoon peanut butter   Carbohydrate Counting for People with Diabetes Vegan Sample 1-Day Menu  Breakfast 1 cup cooked oatmeal (2 carbohydrate servings)   cup blueberries (1 carbohydrate serving)  2 tablespoons flaxseeds  1 cup soymilk fortified with calcium and vitamin D  1 cup coffee  Lunch 2 slices whole wheat bread (2 carbohydrate servings)   cup baked tofu   cup lettuce  2 slices tomato  2 slices avocado   cup baby carrots ( carbohydrate serving)  1 orange (1 carbohydrate serving)  1 cup soymilk fortified with calcium and vitamin D   Evening Meal Burrito made with: 1 6-inch corn tortilla (1 carbohydrate serving)  1 cup refried vegetarian beans (2 carbohydrate servings)   cup chopped tomatoes   cup lettuce   cup salsa  1/3 cup brown rice (1 carbohydrate serving)  1 tablespoon olive oil for rice   cup zucchini   Evening Snack 6 small whole grain crackers (1 carbohydrate serving)  2 apricots ( carbohydrate serving)   cup unsalted peanuts ( carbohydrate serving)    Carbohydrate Counting for People with Diabetes Vegetarian (Lacto-Ovo) Sample 1-Day Menu  Breakfast 1 cup cooked oatmeal (2 carbohydrate servings)   cup blueberries (1 carbohydrate serving)  2 tablespoons flaxseeds  1 egg  1 cup 1% milk (1 carbohydrate serving)  1 cup coffee  Lunch 2 slices whole wheat bread (2 carbohydrate servings)  2 ounces low-fat cheese   cup lettuce  2 slices tomato  2 slices avocado   cup baby carrots ( carbohydrate serving)  1 orange (1 carbohydrate serving)  1 cup unsweetened tea  Evening Meal Burrito made with: 1 6-inch corn tortilla (1 carbohydrate serving)   cup refried vegetarian beans (1 carbohydrate serving)   cup tomatoes   cup lettuce    cup salsa  1/3 cup brown rice (1 carbohydrate serving)  1 tablespoon olive oil for rice   cup zucchini  1 cup 1% milk (1 carbohydrate serving)  Evening Snack 6 small whole grain crackers (1 carbohydrate serving)  2 apricots ( carbohydrate serving)   cup unsalted peanuts ( carbohydrate serving)    Copyright 2020  Academy of Nutrition and Dietetics. All rights reserved.  Using Nutrition Labels: Carbohydrate  Serving Size  Look at the serving size. All the information on the label is based on this portion. Servings Per Container  The number of servings contained in the package. Guidelines for Carbohydrate  Look at  the total grams of carbohydrate in the serving size.  1 carbohydrate choice = 15 grams of carbohydrate. Range of Carbohydrate Grams Per Choice  Carbohydrate Grams/Choice Carbohydrate Choices  6-10   11-20 1  21-25 1  26-35 2  36-40 2  41-50 3  51-55 3  56-65 4  66-70 4  71-80 5    Copyright 2020  Academy of Nutrition and Dietetics. All rights reserved.

## 2021-06-13 ENCOUNTER — Inpatient Hospital Stay: Payer: Medicare HMO

## 2021-06-13 DIAGNOSIS — E119 Type 2 diabetes mellitus without complications: Secondary | ICD-10-CM | POA: Diagnosis not present

## 2021-06-13 DIAGNOSIS — Z794 Long term (current) use of insulin: Secondary | ICD-10-CM | POA: Diagnosis not present

## 2021-06-13 DIAGNOSIS — N3000 Acute cystitis without hematuria: Secondary | ICD-10-CM | POA: Diagnosis not present

## 2021-06-13 LAB — CBC WITH DIFFERENTIAL/PLATELET
Abs Immature Granulocytes: 0.06 10*3/uL (ref 0.00–0.07)
Basophils Absolute: 0 10*3/uL (ref 0.0–0.1)
Basophils Relative: 0 %
Eosinophils Absolute: 0.1 10*3/uL (ref 0.0–0.5)
Eosinophils Relative: 1 %
HCT: 33.2 % — ABNORMAL LOW (ref 39.0–52.0)
Hemoglobin: 11.9 g/dL — ABNORMAL LOW (ref 13.0–17.0)
Immature Granulocytes: 1 %
Lymphocytes Relative: 9 %
Lymphs Abs: 0.6 10*3/uL — ABNORMAL LOW (ref 0.7–4.0)
MCH: 34.3 pg — ABNORMAL HIGH (ref 26.0–34.0)
MCHC: 35.8 g/dL (ref 30.0–36.0)
MCV: 95.7 fL (ref 80.0–100.0)
Monocytes Absolute: 0.7 10*3/uL (ref 0.1–1.0)
Monocytes Relative: 10 %
Neutro Abs: 5.4 10*3/uL (ref 1.7–7.7)
Neutrophils Relative %: 79 %
Platelets: 217 10*3/uL (ref 150–400)
RBC: 3.47 MIL/uL — ABNORMAL LOW (ref 4.22–5.81)
RDW: 11.2 % — ABNORMAL LOW (ref 11.5–15.5)
WBC: 6.8 10*3/uL (ref 4.0–10.5)
nRBC: 0 % (ref 0.0–0.2)

## 2021-06-13 LAB — BASIC METABOLIC PANEL
Anion gap: 7 (ref 5–15)
BUN: 10 mg/dL (ref 8–23)
CO2: 25 mmol/L (ref 22–32)
Calcium: 8.2 mg/dL — ABNORMAL LOW (ref 8.9–10.3)
Chloride: 92 mmol/L — ABNORMAL LOW (ref 98–111)
Creatinine, Ser: 0.44 mg/dL — ABNORMAL LOW (ref 0.61–1.24)
GFR, Estimated: >60 mL/min (ref >60)
Glucose, Bld: 111 mg/dL — ABNORMAL HIGH (ref 70–99)
Potassium: 3.3 mmol/L — ABNORMAL LOW (ref 3.5–5.1)
Sodium: 124 mmol/L — ABNORMAL LOW (ref 135–145)

## 2021-06-13 LAB — COMPREHENSIVE METABOLIC PANEL
ALT: 48 U/L — ABNORMAL HIGH (ref 0–44)
AST: 64 U/L — ABNORMAL HIGH (ref 15–41)
Albumin: 3.3 g/dL — ABNORMAL LOW (ref 3.5–5.0)
Alkaline Phosphatase: 67 U/L (ref 38–126)
Anion gap: 7 (ref 5–15)
BUN: 9 mg/dL (ref 8–23)
CO2: 26 mmol/L (ref 22–32)
Calcium: 8 mg/dL — ABNORMAL LOW (ref 8.9–10.3)
Chloride: 93 mmol/L — ABNORMAL LOW (ref 98–111)
Creatinine, Ser: 0.5 mg/dL — ABNORMAL LOW (ref 0.61–1.24)
GFR, Estimated: >60 mL/min (ref >60)
Glucose, Bld: 203 mg/dL — ABNORMAL HIGH (ref 70–99)
Potassium: 3.7 mmol/L (ref 3.5–5.1)
Sodium: 126 mmol/L — ABNORMAL LOW (ref 135–145)
Total Bilirubin: 1 mg/dL (ref 0.3–1.2)
Total Protein: 5.6 g/dL — ABNORMAL LOW (ref 6.5–8.1)

## 2021-06-13 LAB — MAGNESIUM: Magnesium: 1.7 mg/dL (ref 1.7–2.4)

## 2021-06-13 LAB — SODIUM
Sodium: 122 mmol/L — ABNORMAL LOW (ref 135–145)
Sodium: 124 mmol/L — ABNORMAL LOW (ref 135–145)
Sodium: 126 mmol/L — ABNORMAL LOW (ref 135–145)

## 2021-06-13 LAB — URINE CULTURE
Culture: >=100000 — AB
Special Requests: NORMAL

## 2021-06-13 LAB — GLUCOSE, CAPILLARY
Glucose-Capillary: 208 mg/dL — ABNORMAL HIGH (ref 70–99)
Glucose-Capillary: 132 mg/dL — ABNORMAL HIGH (ref 70–99)
Glucose-Capillary: 164 mg/dL — ABNORMAL HIGH (ref 70–99)
Glucose-Capillary: 156 mg/dL — ABNORMAL HIGH (ref 70–99)

## 2021-06-13 MED ORDER — GADOBUTROL 1 MMOL/ML IV SOLN
6.0000 mL | Freq: Once | INTRAVENOUS | Status: AC | PRN
  Administered 2021-06-13: 7.5 mL via INTRAVENOUS

## 2021-06-13 MED ORDER — SODIUM CHLORIDE 0.9 % IV SOLN: INTRAVENOUS | Status: DC

## 2021-06-13 NOTE — TOC Progression Note (Signed)
Transition of Care (TOC) - Progression Note  ? ? ?Patient Details  ?Name: John Perez ?MRN: [id number] ?Date of Birth: 1940-12-07 ? ?Transition of Care (TOC) CM/SW Contact  ?Izola Price, RN ?Phone Number: ?06/13/2021, 1:03 PM ? ?Clinical Narrative:  Spoke with daughter,(SELLAR,JUILA [phone] ([place])) who was present in patient's room. She had expressed concerns to unit RN about fall/safety issues once at home due to higher than usual sleigh bed frame. She feels they need a hospital bed on temporary basis until patient is stronger. PT recommended no follow up, and a RW, possibly, on discharge.  ?Messaged provider and if ordered will contact [employer] to explore insurance approval/rental options if not covered. Simmie Davies RN CM  ? ? ?  ?  ? ?Expected Discharge Plan and Services ?  ?  ?  ?  ?  ?                ?  ?  ?  ?  ?  ?  ?  ?  ?  ?  ? ? ?Social Determinants of Health (SDOH) Interventions ?  ? ?Readmission Risk Interventions ?No flowsheet data found. ? ?

## 2021-06-13 NOTE — Progress Notes (Signed)
Progress Note   Patient: John Perez MGQ:[id number] DOB: [birth year] DOA: 06/10/2021     3 DOS: the patient was seen and examined on 06/13/2021   Brief hospital course: John Perez is an 81 yo male with PMH DM II, hard of hearing, HTN, HLD who presented to the ER today with worsening confusion at home. He was also seen in the ER on 06/09/2021 after a fall at home when walking down the steps.  It was reported that he lost his balance and fell forward. Head, neck, and shoulder imaging were negative for acute fractures. Head CT did show a 3.5 cm focus of low density in the left parietal lobe which was further evaluated with MRI brain and was noted to be hyperintense on T2 weighted signal which may suggest a possible low-grade neoplasm.  It was recommended to have repeat MRI brain in 6 to 12 weeks. No labs were ordered during that encounter.  Today when seen in the ER for his confusion he underwent further work-up and was found to have sodium 112. Other notable labs included BUN 16, creatinine 0.48, AST 106, ALT 52, total bili 1.4. WBC 14.8, hemoglobin 13.4 g/dL, hematocrit 35.9, platelets 253.  He was given a 1 L bag of saline in the ER.  Urinalysis was also obtained in the ER which showed large LE, greater than 50 WBC, many bacteria.  Assessment and Plan: * Hyponatremia - Na 112 on presentation; labs reviewed on care everywhere lowest recently seen was 124 on 05/29/2021 otherwise has been essentially over 130 historically - wife is confident that patient does not drink etoh. Home meds reviewed, he was recently started on Paxil after an office visit on 06/05/21 although would not expect adverse effect this rapidly. Other considered differentials would be excessive water intake vs the density in the parietal lobe concerning for possible malignancy as this could cause an SIADH hyponatremia picture vs a hormone abnormality (TSH, adrenals) - TSH is normal. Low suspicion for adrenal pathology as well - patient  continues to endorse he drinks excessive free water due to worrying about his diabetes being controlled; this is likely polydipsia in this context - FeNa also 0.1% - started on 3% NS on admission with rapid correction so discontinued -Sodium has continued to slowly improve with some autoregulation - Starting on LR today and will continue to trend sodium levels -Mentation has been slowly improving  UTI (urinary tract infection) - UA noted with negative nitrite, large LE, greater than 50 WBC, many bacteria -Urine culture growing Serratia, sensitivities reviewed - Continue Rocephin, complete 5 days given lagging confusion likely confounded some by underlying hyponatremia  Abnormal LFTs - unclear etiology; possibly from cholestasis from infection  -Hepatitis panel negative - continue trending LFTs; downtrending some  Abnormal brain MRI - MRI brain on 2/28 shows ~3.5 cm hyperintense abnormal T2 weighted signal concerning for low-grade neoplasm -Repeat MRI brain was recommended in 6 to 12 weeks but pending above work-up, may need sooner evaluation but will decide as hyponatremia further resolves  Diabetes mellitus type 2, insulin dependent (HCC) - A1c 6.6% - Patient is on Lantus and sliding scale at home.  He also follow strict dietary adherence to a carb consistent diet. -His A1c is essentially at goal and I would suspect he does not need basal insulin anymore possibly nor even sliding scale -His metformin dose at home is 500 mg twice daily.  This could be considered to be increased and lieu of discontinuing insulin even; I have discussed  this bedside with patient and his daughter.  We will discuss this further prior to discharge as well -He has barely been needed any sliding scale while hospitalized; and his home basal insulin has been on hold as well      Subjective: No events overnight.  Family present bedside this morning.  Mentation still improving but still not fully normal.  He is  still confused as to the current year but answers everything else appropriately.  Family hesitant to take him home until mentation continues to improve further.   Physical Exam: Vitals:   06/12/21 1541 06/12/21 2045 06/13/21 0538 06/13/21 0753  BP: 140/70 (!) 170/80 (!) 149/81 140/71  Pulse: 70 67 80 62  Resp: 18 17 16 15   Temp: 98.4 F (36.9 C) 98 F (36.7 C) 97.9 F (36.6 C) 98.1 F (36.7 C)  TempSrc: Oral Oral Oral   SpO2: 100% 100% 100% 99%  Height:       Physical Exam Constitutional:      General: He is not in acute distress.    Comments: Confusion mildly present but has continued to improve throughout hospitalization  HENT:     Head: Normocephalic and atraumatic.     Mouth/Throat:     Mouth: Mucous membranes are moist.  Eyes:     Extraocular Movements: Extraocular movements intact.  Cardiovascular:     Rate and Rhythm: Normal rate and regular rhythm.     Heart sounds: Normal heart sounds.  Pulmonary:     Effort: Pulmonary effort is normal. No respiratory distress.     Breath sounds: Normal breath sounds. No wheezing.  Abdominal:     General: Bowel sounds are normal. There is no distension.     Palpations: Abdomen is soft.     Tenderness: There is no abdominal tenderness.  Musculoskeletal:        General: Normal range of motion.     Cervical back: Normal range of motion and neck supple.  Skin:    General: Skin is warm and dry.  Neurological:     Mental Status: He is alert.     Comments: Oriented to name, place, and president and situation.  Still does not know year  Psychiatric:     Comments: Organization of thought process is also improving     Data Reviewed: Results for orders placed or performed during the hospital encounter of 06/10/21 (from the past 24 hour(s))  Glucose, capillary     Status: Abnormal   Collection Time: 06/12/21  3:39 PM  Result Value Ref Range   Glucose-Capillary 247 (H) 70 - 99 mg/dL  Sodium     Status: Abnormal   Collection Time:  06/12/21  6:01 PM  Result Value Ref Range   Sodium 123 (L) 135 - 145 mmol/L  Glucose, capillary     Status: Abnormal   Collection Time: 06/12/21  8:45 PM  Result Value Ref Range   Glucose-Capillary 162 (H) 70 - 99 mg/dL  Sodium     Status: Abnormal   Collection Time: 06/13/21 12:04 AM  Result Value Ref Range   Sodium 126 (L) 135 - 145 mmol/L  CBC with Differential/Platelet     Status: Abnormal   Collection Time: 06/13/21  6:39 AM  Result Value Ref Range   WBC 6.8 4.0 - 10.5 K/uL   RBC 3.47 (L) 4.22 - 5.81 MIL/uL   Hemoglobin 11.9 (L) 13.0 - 17.0 g/dL   HCT 33.2 (L) 39.0 - 52.0 %   MCV 95.7 80.0 -  100.0 fL   MCH 34.3 (H) 26.0 - 34.0 pg   MCHC 35.8 30.0 - 36.0 g/dL   RDW 11.2 (L) 11.5 - 15.5 %   Platelets 217 150 - 400 K/uL   nRBC 0.0 0.0 - 0.2 %   Neutrophils Relative % 79 %   Neutro Abs 5.4 1.7 - 7.7 K/uL   Lymphocytes Relative 9 %   Lymphs Abs 0.6 (L) 0.7 - 4.0 K/uL   Monocytes Relative 10 %   Monocytes Absolute 0.7 0.1 - 1.0 K/uL   Eosinophils Relative 1 %   Eosinophils Absolute 0.1 0.0 - 0.5 K/uL   Basophils Relative 0 %   Basophils Absolute 0.0 0.0 - 0.1 K/uL   Immature Granulocytes 1 %   Abs Immature Granulocytes 0.06 0.00 - 0.07 K/uL  Magnesium     Status: None   Collection Time: 06/13/21  6:39 AM  Result Value Ref Range   Magnesium 1.7 1.7 - 2.4 mg/dL  Comprehensive metabolic panel     Status: Abnormal   Collection Time: 06/13/21  6:39 AM  Result Value Ref Range   Sodium 126 (L) 135 - 145 mmol/L   Potassium 3.7 3.5 - 5.1 mmol/L   Chloride 93 (L) 98 - 111 mmol/L   CO2 26 22 - 32 mmol/L   Glucose, Bld 203 (H) 70 - 99 mg/dL   BUN 9 8 - 23 mg/dL   Creatinine, Ser 0.50 (L) 0.61 - 1.24 mg/dL   Calcium 8.0 (L) 8.9 - 10.3 mg/dL   Total Protein 5.6 (L) 6.5 - 8.1 g/dL   Albumin 3.3 (L) 3.5 - 5.0 g/dL   AST 64 (H) 15 - 41 U/L   ALT 48 (H) 0 - 44 U/L   Alkaline Phosphatase 67 38 - 126 U/L   Total Bilirubin 1.0 0.3 - 1.2 mg/dL   GFR, Estimated >60 >60 mL/min    Anion gap 7 5 - 15  Glucose, capillary     Status: Abnormal   Collection Time: 06/13/21  7:50 AM  Result Value Ref Range   Glucose-Capillary 208 (H) 70 - 99 mg/dL  Glucose, capillary     Status: Abnormal   Collection Time: 06/13/21 11:22 AM  Result Value Ref Range   Glucose-Capillary 132 (H) 70 - 99 mg/dL  Basic metabolic panel     Status: Abnormal   Collection Time: 06/13/21  1:01 PM  Result Value Ref Range   Sodium 124 (L) 135 - 145 mmol/L   Potassium 3.3 (L) 3.5 - 5.1 mmol/L   Chloride 92 (L) 98 - 111 mmol/L   CO2 25 22 - 32 mmol/L   Glucose, Bld 111 (H) 70 - 99 mg/dL   BUN 10 8 - 23 mg/dL   Creatinine, Ser 0.44 (L) 0.61 - 1.24 mg/dL   Calcium 8.2 (L) 8.9 - 10.3 mg/dL   GFR, Estimated >60 >60 mL/min   Anion gap 7 5 - 15    I have Reviewed nursing notes, Vitals, and Lab results since pt's last encounter. Pertinent lab results : see above I have ordered test including BMP, CBC, Mg I have reviewed the last note from staff over past 24 hours I have discussed pt's care plan and test results with nursing staff, case manager   Family Communication: daughter   Disposition: Status is: Inpatient Remains inpatient appropriate because: Treatment as outlined in A&P     Planned Discharge Destination: Home  Antimicrobials: Rocephin 3/2 >> current  Consultants:   Procedures:  DVT ppx:  enoxaparin (LOVENOX) injection 40 mg Start: 06/11/21 1000     Code Status: Full Code    Author: Dwyane Dee, MD 06/13/2021 1:36 PM  For on call review [email or web address].

## 2021-06-14 DIAGNOSIS — R9089 Other abnormal findings on diagnostic imaging of central nervous system: Secondary | ICD-10-CM | POA: Diagnosis not present

## 2021-06-14 DIAGNOSIS — R4182 Altered mental status, unspecified: Secondary | ICD-10-CM

## 2021-06-14 DIAGNOSIS — R7989 Other specified abnormal findings of blood chemistry: Secondary | ICD-10-CM | POA: Diagnosis not present

## 2021-06-14 DIAGNOSIS — E119 Type 2 diabetes mellitus without complications: Secondary | ICD-10-CM | POA: Diagnosis not present

## 2021-06-14 DIAGNOSIS — E871 Hypo-osmolality and hyponatremia: Secondary | ICD-10-CM | POA: Diagnosis not present

## 2021-06-14 LAB — CBC WITH DIFFERENTIAL/PLATELET
Abs Immature Granulocytes: 0.06 10*3/uL (ref 0.00–0.07)
Basophils Absolute: 0 10*3/uL (ref 0.0–0.1)
Basophils Relative: 0 %
Eosinophils Absolute: 0 10*3/uL (ref 0.0–0.5)
Eosinophils Relative: 1 %
HCT: 32 % — ABNORMAL LOW (ref 39.0–52.0)
Hemoglobin: 11.7 g/dL — ABNORMAL LOW (ref 13.0–17.0)
Immature Granulocytes: 1 %
Lymphocytes Relative: 11 %
Lymphs Abs: 0.6 10*3/uL — ABNORMAL LOW (ref 0.7–4.0)
MCH: 35 pg — ABNORMAL HIGH (ref 26.0–34.0)
MCHC: 36.6 g/dL — ABNORMAL HIGH (ref 30.0–36.0)
MCV: 95.8 fL (ref 80.0–100.0)
Monocytes Absolute: 0.6 10*3/uL (ref 0.1–1.0)
Monocytes Relative: 11 %
Neutro Abs: 4 10*3/uL (ref 1.7–7.7)
Neutrophils Relative %: 76 %
Platelets: 196 10*3/uL (ref 150–400)
RBC: 3.34 MIL/uL — ABNORMAL LOW (ref 4.22–5.81)
RDW: 11.3 % — ABNORMAL LOW (ref 11.5–15.5)
WBC: 5.3 10*3/uL (ref 4.0–10.5)
nRBC: 0 % (ref 0.0–0.2)

## 2021-06-14 LAB — COMPREHENSIVE METABOLIC PANEL
ALT: 45 U/L — ABNORMAL HIGH (ref 0–44)
AST: 53 U/L — ABNORMAL HIGH (ref 15–41)
Albumin: 3.1 g/dL — ABNORMAL LOW (ref 3.5–5.0)
Alkaline Phosphatase: 64 U/L (ref 38–126)
Anion gap: 13 (ref 5–15)
BUN: 8 mg/dL (ref 8–23)
CO2: 25 mmol/L (ref 22–32)
Calcium: 8.1 mg/dL — ABNORMAL LOW (ref 8.9–10.3)
Chloride: 90 mmol/L — ABNORMAL LOW (ref 98–111)
Creatinine, Ser: 0.48 mg/dL — ABNORMAL LOW (ref 0.61–1.24)
GFR, Estimated: >60 mL/min (ref >60)
Glucose, Bld: 172 mg/dL — ABNORMAL HIGH (ref 70–99)
Potassium: 3.9 mmol/L (ref 3.5–5.1)
Sodium: 128 mmol/L — ABNORMAL LOW (ref 135–145)
Total Bilirubin: 0.9 mg/dL (ref 0.3–1.2)
Total Protein: 5.6 g/dL — ABNORMAL LOW (ref 6.5–8.1)

## 2021-06-14 LAB — MAGNESIUM: Magnesium: 1.6 mg/dL — ABNORMAL LOW (ref 1.7–2.4)

## 2021-06-14 LAB — GLUCOSE, CAPILLARY
Glucose-Capillary: 162 mg/dL — ABNORMAL HIGH (ref 70–99)
Glucose-Capillary: 154 mg/dL — ABNORMAL HIGH (ref 70–99)
Glucose-Capillary: 132 mg/dL — ABNORMAL HIGH (ref 70–99)
Glucose-Capillary: 156 mg/dL — ABNORMAL HIGH (ref 70–99)

## 2021-06-14 LAB — SODIUM
Sodium: 125 mmol/L — ABNORMAL LOW (ref 135–145)
Sodium: 127 mmol/L — ABNORMAL LOW (ref 135–145)
Sodium: 127 mmol/L — ABNORMAL LOW (ref 135–145)
Sodium: 128 mmol/L — ABNORMAL LOW (ref 135–145)

## 2021-06-14 LAB — SODIUM, URINE, RANDOM: Sodium, Ur: 123 mmol/L

## 2021-06-14 LAB — AMMONIA: Ammonia: 28 umol/L (ref 9–35)

## 2021-06-14 LAB — VITAMIN B12: Vitamin B-12: 6805 pg/mL — ABNORMAL HIGH (ref 180–914)

## 2021-06-14 LAB — OSMOLALITY: Osmolality: 266 mOsm/kg — ABNORMAL LOW (ref 275–295)

## 2021-06-14 LAB — URIC ACID: Uric Acid, Serum: 2.7 mg/dL — ABNORMAL LOW (ref 3.7–8.6)

## 2021-06-14 LAB — CREATININE, URINE, RANDOM: Creatinine, Urine: 28 mg/dL

## 2021-06-14 LAB — OSMOLALITY, URINE: Osmolality, Ur: 407 mOsm/kg (ref 300–900)

## 2021-06-14 MED ORDER — THIAMINE HCL 100 MG PO TABS: 100.0000 mg | ORAL_TABLET | Freq: Every day | ORAL | Status: DC

## 2021-06-14 MED ORDER — SODIUM CHLORIDE 1 G PO TABS
2.0000 g | ORAL_TABLET | Freq: Two times a day (BID) | ORAL | Status: DC
Start: 2021-06-14 — End: 2021-06-17
  Administered 2021-06-14 – 2021-06-17 (×6): 2 g via ORAL
  Filled 2021-06-14 (×6): qty 2

## 2021-06-14 MED ORDER — SODIUM CHLORIDE 3 % IV SOLN
INTRAVENOUS | Status: DC
  Filled 2021-06-14: qty 500

## 2021-06-14 MED ORDER — FUROSEMIDE 20 MG PO TABS
10.0000 mg | ORAL_TABLET | Freq: Two times a day (BID) | ORAL | Status: DC
Start: 2021-06-14 — End: 2021-06-15
  Administered 2021-06-14 – 2021-06-15 (×3): 10 mg via ORAL
  Filled 2021-06-14 (×3): qty 1

## 2021-06-14 NOTE — Consult Note (Addendum)
John Perez MRN: [id number] DOB/AGE: [birth year] 81 y.o. Primary Care Physician:Babaoff, Berna Spare, MD Admit date: 06/10/2021 Chief Complaint:  Chief Complaint  Patient presents with   Altered Mental Status   Fall   HPI: Patient is 81 year old male with a past medical history of diabetes mellitus type 2, hard of hearing, hypertension, hyperlipidemia who presented to the ER on February 28 with a chief complaint of mechanical fall. Patient was evaluated and later discharged home Patient presented again on March 1 with chief complaint unwitnessed fall at this time patient was accompanied by his spouse who also gave a history that patient has been confused over past few days.  Upon evaluation in the ER patient was found to have a sodium of 112, elevated LFTs.   Patient also had a UA which showed WBC and bacteria.  Patient was admitted for further care  On March 1 patient did receive 3% saline at 25 mL/h  Patient sodium on March 1 at 5:20pm was 113 Patient sodium 1 March 1 at 8:20pm was 118 Patient sodium at midnight off March 1 in March 2 was 121 Patient 3% saline was held Patient sodium by 2:00 in the morning on March 2 remained stable at 120 and by 6:00 it increased to 122. Patient was later started on Ringer lactate on March 2/3rd. Nephrology was consulted on March 5  Patient wife and daughter was present in the room Patient remains pleasantly confused Patient offers no complaint of chest pain No complaint of fever cough or chills No complaint of nausea/vomiting/diarrhea Patient main concern in today visit was when he can go back to his schedule   Past Medical History:  Diagnosis Date   Diabetes mellitus type 2, insulin dependent (HCC)    HOH (hard of hearing)    Hypercholesteremia    Hypertension         No family history on file. No family history of ESRD    Social History:  reports that he quit smoking about 33 years ago. He has never used smokeless tobacco. He  reports that he does not currently use alcohol. No history on file for drug use.   Allergies: No Known Allergies  Medications Prior to Admission  Medication Sig Dispense Refill   ASPIRIN 81 PO Take by mouth daily.     benazepril (LOTENSIN) 20 MG tablet Take 20 mg by mouth daily.     finasteride (PROSCAR) 5 MG tablet Take 5 mg by mouth daily.     insulin glargine (LANTUS) 100 UNIT/ML injection Inject 16 Units into the skin daily. evening     MELATONIN PO Take by mouth at bedtime as needed.     metFORMIN (GLUMETZA) 500 MG (MOD) 24 hr tablet Take 500 mg by mouth daily. With supper     NOVOLOG FLEXPEN 100 UNIT/ML FlexPen Inject 25 Units into the skin daily.     Omega-3 Fatty Acids (FISH OIL PO) Take by mouth daily.     PARoxetine (PAXIL) 10 MG tablet Take 10 mg by mouth daily.     pravastatin (PRAVACHOL) 20 MG tablet Take 20 mg by mouth daily.     tamsulosin (FLOMAX) 0.4 MG CAPS capsule Take 0.4 mg by mouth.     traMADol (ULTRAM) 50 MG tablet Take 1 tablet (50 mg total) by mouth every 6 (six) hours as needed for up to 5 days. 15 tablet 0   Multiple Vitamin (MULTIVITAMIN PO) Take by mouth daily.         WUJ:WJXBJ from the  symptoms mentioned above,there are no other symptoms referable to all systems reviewed.   aspirin  81 mg Oral Daily   benazepril  20 mg Oral Daily   enoxaparin (LOVENOX) injection  40 mg Subcutaneous Daily   finasteride  5 mg Oral Daily   furosemide  10 mg Oral BID   insulin aspart  0-5 Units Subcutaneous QHS   insulin aspart  0-9 Units Subcutaneous TID WC   metFORMIN  1,000 mg Oral BID WC   PARoxetine  10 mg Oral Daily   sodium chloride  2 g Oral BID WC   tamsulosin  0.4 mg Oral Daily      Physical Exam: Vital signs in last 24 hours: Temp:  [97.3 F (36.3 C)-98.2 F (36.8 C)] 97.6 F (36.4 C) (03/05 0747) Pulse Rate:  [58-70] 58 (03/05 0747) Resp:  [16-19] 17 (03/05 0747) BP: (156-172)/(74-82) 156/74 (03/05 0747) SpO2:  [94 %-99 %] 99 % (03/05  0747) Weight change:  Last BM Date : 06/12/21 (per pt; unable to verify with family; not documented in [facility])  Intake/Output from previous day: 03/04 0701 - 03/05 0700 In: 2190.8 [P.O.:225; I.V.:1965.8] Out: 1275 [Urine:1275] No intake/output data recorded.   Physical Exam: General- pt is Awake oriented to person but confused Resp- No acute REsp distress, CTA B/L NO Rhonchi CVS- S1S2 regular in rate and rhythm GIT- BS+, soft, NT, ND EXT- NO LE Edema, no Cyanosis CNS- CN 2-12 grossly intact. Moving all 4 extremities Psych- normal mood and affect    Lab Results: CBC Recent Labs    06/13/21 0639 06/14/21 0656  WBC 6.8 5.3  HGB 11.9* 11.7*  HCT 33.2* 32.0*  PLT 217 196    BMET Recent Labs    06/13/21 1301 06/13/21 1800 06/14/21 0656 06/14/21 0838  NA 124*   < > 128* 125*  K 3.3*  --  3.9  --   CL 92*  --  90*  --   CO2 25  --  25  --   GLUCOSE 111*  --  172*  --   BUN 10  --  8  --   CREATININE 0.44*  --  0.48*  --   CALCIUM 8.2*  --  8.1*  --    < > = values in this interval not displayed.    MICRO Recent Results (from the past 240 hour(s))  Urine Culture     Status: Abnormal   Collection Time: 06/10/21  3:42 PM   Specimen: Urine, Random  Result Value Ref Range Status   Specimen Description   Final    URINE, RANDOM Performed at [facility], [address], [place], [place] [address]    Special Requests   Final    Normal Performed at [facility], [address] [place], [place] [address]    Culture >=100,000 COLONIES/mL SERRATIA MARCESCENS (A)  Final   Report Status 06/13/2021 FINAL  Final   Organism ID, Bacteria SERRATIA MARCESCENS (A)  Final      Susceptibility   Serratia marcescens - MIC*    CEFAZOLIN >=64 RESISTANT Resistant     CEFEPIME <=0.12 SENSITIVE Sensitive     CEFTRIAXONE <=0.25 SENSITIVE Sensitive     CIPROFLOXACIN <=0.25 SENSITIVE Sensitive     GENTAMICIN <=1 SENSITIVE Sensitive     NITROFURANTOIN 256 RESISTANT  Resistant     TRIMETH/SULFA <=20 SENSITIVE Sensitive     * >=100,000 COLONIES/mL SERRATIA MARCESCENS  Resp Panel by RT-PCR (Flu A&B, Covid) Nasopharyngeal Swab  Status: None   Collection Time: 06/10/21  4:41 PM   Specimen: Nasopharyngeal Swab; Nasopharyngeal(NP) swabs in vial transport medium  Result Value Ref Range Status   SARS Coronavirus 2 by RT PCR NEGATIVE NEGATIVE Final    Comment: (NOTE) SARS-CoV-2 target nucleic acids are NOT DETECTED.  The SARS-CoV-2 RNA is generally detectable in upper respiratory specimens during the acute phase of infection. The lowest concentration of SARS-CoV-2 viral copies this assay can detect is 138 copies/mL. A negative result does not preclude SARS-Cov-2 infection and should not be used as the sole basis for treatment or other patient management decisions. A negative result may occur with  improper specimen collection/handling, submission of specimen other than nasopharyngeal swab, presence of viral mutation(s) within the areas targeted by this assay, and inadequate number of viral copies(<138 copies/mL). A negative result must be combined with clinical observations, patient history, and epidemiological information. The expected result is Negative.  Fact Sheet for Patients:  BloggerCourse.com  Fact Sheet for Healthcare Providers:  SeriousBroker.it  This test is no t yet approved or cleared by the [place] FDA and  has been authorized for detection and/or diagnosis of SARS-CoV-2 by FDA under an Emergency Use Authorization (EUA). This EUA will remain  in effect (meaning this test can be used) for the duration of the COVID-19 declaration under Section 564(b)(1) of the Act, 21 U.S.C.section 360bbb-3(b)(1), unless the authorization is terminated  or revoked sooner.       Influenza A by PCR NEGATIVE NEGATIVE Final   Influenza B by PCR NEGATIVE NEGATIVE Final    Comment: (NOTE) The Xpert  Xpress SARS-CoV-2/FLU/RSV plus assay is intended as an aid in the diagnosis of influenza from Nasopharyngeal swab specimens and should not be used as a sole basis for treatment. Nasal washings and aspirates are unacceptable for Xpert Xpress SARS-CoV-2/FLU/RSV testing.  Fact Sheet for Patients: BloggerCourse.com  Fact Sheet for Healthcare Providers: SeriousBroker.it  This test is not yet approved or cleared by the [place] FDA and has been authorized for detection and/or diagnosis of SARS-CoV-2 by FDA under an Emergency Use Authorization (EUA). This EUA will remain in effect (meaning this test can be used) for the duration of the COVID-19 declaration under Section 564(b)(1) of the Act, 21 U.S.C. section 360bbb-3(b)(1), unless the authorization is terminated or revoked.  Performed at [facility], [address] [place], [place] [address]       Lab Results  Component Value Date   CALCIUM 8.1 (L) 06/14/2021      Impression:  Patient is a 81 year old Caucasian male with a past medical history of diabetes mellitus type 2, hypertension, microalbuminuria, hyponatremia who is admitted with altered mental status Hyponatremia UTI Abnormal LFTs Abnormal brain MRI  1)Hyponatremia Patient has history of chronic hyponatremia Patient has had sodium low going back to 2021   Latest Reference Range & Units 06/10/21 15:12  Sodium 135 - 145 mmol/L 112 (LL)  (LL): Data is critically low   Latest Reference Range & Units 06/10/21 21:22  Osmolality 275 - 295 mOsm/kg 247 (LL)  (LL): Data is critically low    Latest Reference Range & Units 06/10/21 15:42  Osmolality, Urine 300 - 900 mOsm/kg 607     Latest Reference Range & Units 06/10/21 15:42  Sodium, Urine mmol/L 12     Patient has hyponatremia secondary to multiple factors  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 Patient serum sodium low Patient serum osmolality low Patient  urine osmolality high  Data in favor of  hypovolemia Patient urine sodium was low   Data in favor of iatrogenic causes patient was on SSRIs  We will ask for urine osmolality  urine sodium and urine creatinine again today Will ask for uric acid levels  Patient data was reviewed from today   Latest Reference Range & Units 06/14/21 09:23  Osmolality, Urine 300 - 900 mOsm/kg 407  Sodium, Urine mmol/L 123  Creatinine, Urine mg/dL 28   Patient has SIADH Patient urine osmolality remains high Patient serum osmolality remains low Patient urine sodium has changed from 12 mmol to now 123 mmol Patient is euvolemic     2)HTN BP at goal    3)Anemia HGb at goal (9--11)  Hgb stable   4) diabetes mellitus type 2 Being followed by the primary team   5) microalbuminuria Patient is on benazepril   6)Renal Patient has CKD stage 1 Patient has CKD secondary to diabetes mellitus Patient has history of microalbuminuria/moderately increased albuminuria going back to 2021   7)Acid base Co2 at goal  8)Abnormal LFTs Primary team is following  9) UTI Patient is on antibiotics Primary team is following     Plan:  I did ask for urine osmolality  urine sodium and urine creatinine again today I reviewed the data.    patient has SIADH We will discontinue 3% saline We will start patient on fluid restriction We will start patient on salt tablets plus Lasix combination As patient sodium is more than 120 And abnormal LFTs we will hold off on giving tolvaptan for now   Abdifatah Colquhoun s Gennette Shadix 06/14/2021, 11:52 AM

## 2021-06-14 NOTE — Consult Note (Signed)
NEUROLOGY CONSULTATION NOTE   Date of service: June 15, 2021 Patient Name: John Perez MRN:  [id number] DOB:  April 09, 1941 Reason for consult: encephalopathy Requesting physician: Dr. Dwyane Dee _ _ _   _ __   _ __ _ _  __ __   _ __   __ _  History of Present Illness   This is a 81 yo man with hx DM2, HL, HTN admitted with hyponatremia (Na+ 112) and UTI 5 days ago. Hyponatremia was initially felt to be 2/2 polydipsia, nephrology was consulted today for further evaluation. He has been treated with ceftriaxone for UTI prior to admission. At baseline patient is cognitively and neuro intact per family. Since admission he has been disoriented, the level of which fluctuates. Workup this admission:  Sodium 112 from 124 3 weeks prior AST 106 / ALT 52 TSH 2.08 (+) UTI on ceftriaxone acute hepatitis panel non-reactive  MRI brain wwo contrast 3/4: T2/FLAIR hyperintensity in L parietal region seen on prior MRI does not show contrast enhancement, favor benign process (personal review)   ROS   Per HPI: all other systems reviewed and are negative  Past History   I have reviewed the following:  Past Medical History:  Diagnosis Date   Diabetes mellitus type 2, insulin dependent ([place])    HOH (hard of hearing)    Hypercholesteremia    Hypertension    Past Surgical History:  Procedure Laterality Date   CATARACT EXTRACTION W/PHACO Left 02/25/2020   Procedure: CATARACT EXTRACTION PHACO AND INTRAOCULAR LENS PLACEMENT ([place]) LEFT DIABETIC;  Surgeon: Eulogio Bear, MD;  Location: [facility];  Service: Ophthalmology;  Laterality: Left;  4.98 0:39.4   CATARACT EXTRACTION W/PHACO Right 03/17/2020   Procedure: CATARACT EXTRACTION PHACO AND INTRAOCULAR LENS PLACEMENT ([place]) RIGHT DIABETIC;  Surgeon: Eulogio Bear, MD;  Location: [facility];  Service: Ophthalmology;  Laterality: Right;  6.33 0:55.3   GUM SURGERY     HERNIA REPAIR     x2   No family history on  file. Social History   Socioeconomic History   Marital status: Married    Spouse name: Not on file   Number of children: Not on file   Years of education: Not on file   Highest education level: Not on file  Occupational History   Not on file  Tobacco Use   Smoking status: Former    Types: Cigarettes    Quit date: 83    Years since quitting: 33.1   Smokeless tobacco: Never  Vaping Use   Vaping Use: Never used  Substance and Sexual Activity   Alcohol use: Not Currently   Drug use: Not on file   Sexual activity: Not on file  Other Topics Concern   Not on file  Social History Narrative   Not on file   Social Determinants of Health   Financial Resource Strain: Not on file  Food Insecurity: Not on file  Transportation Needs: Not on file  Physical Activity: Not on file  Stress: Not on file  Social Connections: Not on file   No Known Allergies  Medications   Medications Prior to Admission  Medication Sig Dispense Refill Last Dose   ASPIRIN 81 PO Take by mouth daily.   06/09/2021   benazepril (LOTENSIN) 20 MG tablet Take 20 mg by mouth daily.   06/09/2021   finasteride (PROSCAR) 5 MG tablet Take 5 mg by mouth daily.   06/09/2021   insulin glargine (LANTUS) 100 UNIT/ML injection Inject  16 Units into the skin daily. evening   06/09/2021 at 1200   MELATONIN PO Take by mouth at bedtime as needed.   06/09/2021   metFORMIN (GLUMETZA) 500 MG (MOD) 24 hr tablet Take 500 mg by mouth daily. With supper   06/09/2021   NOVOLOG FLEXPEN 100 UNIT/ML FlexPen Inject 25 Units into the skin daily.   06/09/2021   Omega-3 Fatty Acids (FISH OIL PO) Take by mouth daily.   06/09/2021   PARoxetine (PAXIL) 10 MG tablet Take 10 mg by mouth daily.   06/09/2021   pravastatin (PRAVACHOL) 20 MG tablet Take 20 mg by mouth daily.   06/09/2021   tamsulosin (FLOMAX) 0.4 MG CAPS capsule Take 0.4 mg by mouth.   06/09/2021   [EXPIRED] traMADol (ULTRAM) 50 MG tablet Take 1 tablet (50 mg total) by mouth every 6 (six)  hours as needed for up to 5 days. 15 tablet 0 prn at prn   Multiple Vitamin (MULTIVITAMIN PO) Take by mouth daily.         Current Facility-Administered Medications:    acetaminophen (TYLENOL) tablet 650 mg, 650 mg, Oral, Q6H PRN, 650 mg at 06/13/21 1949 **OR** acetaminophen (TYLENOL) suppository 650 mg, 650 mg, Rectal, Q6H PRN, Dwyane Dee, MD   aspirin chewable tablet 81 mg, 81 mg, Oral, Daily, Girguis, David, MD, 81 mg at 06/14/21 0915   benazepril (LOTENSIN) tablet 20 mg, 20 mg, Oral, Daily, Dwyane Dee, MD, 20 mg at 06/14/21 0916   cefTRIAXone (ROCEPHIN) 1 g in sodium chloride 0.9 % 100 mL IVPB, 1 g, Intravenous, Q24H, Girguis, David, MD, Last Rate: 200 mL/hr at 06/14/21 1729, 1 g at 06/14/21 1729   enoxaparin (LOVENOX) injection 40 mg, 40 mg, Subcutaneous, Daily, Girguis, David, MD, 40 mg at 06/14/21 0915   finasteride (PROSCAR) tablet 5 mg, 5 mg, Oral, Daily, Girguis, David, MD, 5 mg at 06/14/21 3267   furosemide (LASIX) tablet 10 mg, 10 mg, Oral, BID, Bhutani, Manpreet S, MD, 10 mg at 06/14/21 1731   insulin aspart (novoLOG) injection 0-5 Units, 0-5 Units, Subcutaneous, QHS, Girguis, David, MD   insulin aspart (novoLOG) injection 0-9 Units, 0-9 Units, Subcutaneous, TID WC, Dwyane Dee, MD, 1 Units at 06/14/21 1730   metFORMIN (GLUCOPHAGE) tablet 1,000 mg, 1,000 mg, Oral, BID WC, Dwyane Dee, MD, 1,000 mg at 06/14/21 1732   PARoxetine (PAXIL) tablet 10 mg, 10 mg, Oral, Daily, Girguis, David, MD, 10 mg at 06/14/21 0916   sodium chloride tablet 2 g, 2 g, Oral, BID WC, Bhutani, Manpreet S, MD, 2 g at 06/14/21 1732   tamsulosin (FLOMAX) capsule 0.4 mg, 0.4 mg, Oral, Daily, Dwyane Dee, MD, 0.4 mg at 06/14/21 0916  Vitals   Vitals:   06/14/21 1632 06/14/21 2051 06/14/21 2356 06/15/21 0355  BP: (!) 168/82 (!) 165/86 (!) 152/79 (!) 163/74  Pulse: 67 79 64 72  Resp: 15 16 16 16   Temp: 98.2 F (36.8 C) 98.6 F (37 C)  98.2 F (36.8 C)  TempSrc: Oral Oral  Oral  SpO2: 96%  100% 98% 96%  Height:         Body mass index is 19.94 kg/m.  Physical Exam   Physical Exam Gen: A&O self and hospital, NAD HEENT: Atraumatic, normocephalic;mucous membranes moist; oropharynx clear, tongue without atrophy or fasciculations. Neck: Supple, trachea midline. Resp: CTAB, no w/r/r CV: RRR, no m/g/r; nml S1 and S2. 2+ symmetric peripheral pulses. Abd: soft/NT/ND; nabs x 4 quad Extrem: Nml bulk; no cyanosis, clubbing, or edema.  Neuro: *  MS: A&O x self and hospital. States year is 81 and month is October. Follows multi-step commands.  *Speech: fluid, nondysarthric, able to name and repeat *CN:    I: Deferred   II,III: PERRLA, VFF by confrontation, optic discs unable to be visualized 2/2 pupillary constriction   III,IV,VI: EOMI w/o nystagmus, no ptosis   V: Sensation intact from V1 to V3 to LT   VII: Eyelid closure was full.  Smile symmetric.   VIII: Hearing intact to voice   IX,X: Voice normal, palate elevates symmetrically    XI: SCM/trap 5/5 bilat   XII: Tongue protrudes midline, no atrophy or fasciculations   *Motor:   Normal bulk.  No tremor, rigidity or bradykinesia. No pronator drift.    Strength: Dlt Bic Tri WrE WrF FgS Gr HF KnF KnE PlF DoF    Left 5 5 5 5 5 5 5 5 5 5 5 5     Right 5 5 5 5 5 5 5 5 5 5 5 5     *Sensory: Intact to light touch, pinprick, temperature vibration throughout. Symmetric. Propioception intact bilat.  No double-simultaneous extinction.  *Coordination:  Finger-to-nose, heel-to-shin, rapid alternating motions were intact. *Reflexes:  2+ and symmetric throughout without clonus; toes down-going bilat *Gait: deferred   Labs   CBC:  Recent Labs  Lab 06/14/21 0656 06/15/21 0457  WBC 5.3 5.7  NEUTROABS 4.0 4.4  HGB 11.7* 11.6*  HCT 32.0* 32.8*  MCV 95.8 96.5  PLT 196 038    Basic Metabolic Panel:  Lab Results  Component Value Date   NA 127 (L) 06/15/2021   K 3.4 (L) 06/15/2021   CO2 24 06/15/2021   GLUCOSE 176 (H)  06/15/2021   BUN 11 06/15/2021   CREATININE 0.46 (L) 06/15/2021   CALCIUM 8.2 (L) 06/15/2021   GFRNONAA >60 06/15/2021   Lipid Panel: No results found for: LDLCALC HgbA1c:  Lab Results  Component Value Date   HGBA1C 6.6 (H) 06/10/2021   Urine Drug Screen: No results found for: LABOPIA, COCAINSCRNUR, LABBENZ, AMPHETMU, THCU, LABBARB  Alcohol Level No results found for: [place] Day   Impression   81 yo man admitted with hyponatremia (Na+ 112) and UTI. Neuro consulted 2/2 persistent but fluctuating AMS since admission. 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 Given seizure risk recommend EEG for fluctuating mental status as well as the labs ordered below.  Recommendations   - F/u B12, B1, ammonia - 100mg  po thiamine daily = F/u rEEG - Ambulatory referral to neurology upon discharge w/ repeat MRI brain wwo in 3 mos as o/p ensure stability of L parietal lesion  Any copied and pasted documentation in this note was written by me in another application and transferred to Epic. ______________________________________________________________________   Thank you for the opportunity to take part in the care of this patient. If you have any further questions, please contact the neurology consultation attending.  Signed,  Su Monks, MD [facility] [phone]  If 7pm- 7am, please page neurology on call as listed in [place].

## 2021-06-14 NOTE — Progress Notes (Signed)
MEDICATION RELATED CONSULT NOTE - INITIAL  ? ?Pharmacy Consult for Hypertonic Saline  ?Indication: hyponatremia  ? ?No Known Allergies ? ?Patient Measurements: ?Height: 5\' 9"  (175.3 cm) ?IBW/kg (Calculated) : 70.7 ?Adjusted Body Weight:  ? ?Vital Signs: ?Temp: 97.4 ?F (36.3 ?C) (03/05 0413) ?Temp Source: Oral (03/05 0413) ?BP: 172/82 (03/05 0413) ?Pulse Rate: 67 (03/05 0413) ?Intake/Output from previous day: ?03/04 0701 - 03/05 0700 ?In: 1869.1 [P.O.:225; I.V.:1644.1] ?Out: 1275 [BPZWC:5852] ?Intake/Output from this shift: ?Total I/O ?In: 1644.1 [I.V.:1644.1] ?Out: 1275 [[id number]:2353] ? ?Labs: ?Recent Labs  ?  06/12/21 ?6144 06/13/21 ?3154 06/13/21 ?1301  ?WBC 8.1 6.8  --   ?HGB 12.7* 11.9*  --   ?HCT 34.1* 33.2*  --   ?PLT 215 217  --   ?CREATININE 0.50* 0.50* 0.44*  ?MG 1.7 1.7  --   ?ALBUMIN 3.3* 3.3*  --   ?PROT 5.6* 5.6*  --   ?AST 79* 64*  --   ?ALT 50* 48*  --   ?ALKPHOS 67 67  --   ?BILITOT 1.2 1.0  --   ? ?Estimated Creatinine Clearance: 62.7 mL/min (A) (by C-G formula based on SCr of 0.44 mg/dL (L)). ? ? ?Microbiology: ?Recent Results (from the past 720 hour(s))  ?Urine Culture     Status: Abnormal  ? Collection Time: 06/10/21  3:42 PM  ? Specimen: Urine, Random  ?Result Value Ref Range Status  ? Specimen Description   Final  ?  URINE, RANDOM ?Performed at [facility], [address] [place], [place] [address] ?  ? Special Requests   Final  ?  Normal ?Performed at [facility], [address] [place], [place] [address] ?  ? Culture >=100,000 COLONIES/mL SERRATIA MARCESCENS (A)  Final  ? Report Status 06/13/2021 FINAL  Final  ? Organism ID, Bacteria SERRATIA MARCESCENS (A)  Final  ?    Susceptibility  ? Serratia marcescens - MIC*  ?  CEFAZOLIN >=64 RESISTANT Resistant   ?  CEFEPIME <=0.12 SENSITIVE Sensitive   ?  CEFTRIAXONE <=0.25 SENSITIVE Sensitive   ?  CIPROFLOXACIN <=0.25 SENSITIVE Sensitive   ?  GENTAMICIN <=1 SENSITIVE Sensitive   ?  NITROFURANTOIN 256 RESISTANT Resistant   ?   TRIMETH/SULFA <=20 SENSITIVE Sensitive   ?  * >=100,000 COLONIES/mL SERRATIA MARCESCENS  ?Resp Panel by RT-PCR (Flu A&B, Covid) Nasopharyngeal Swab     Status: None  ? Collection Time: 06/10/21  4:41 PM  ? Specimen: Nasopharyngeal Swab; Nasopharyngeal(NP) swabs in vial transport medium  ?Result Value Ref Range Status  ? SARS Coronavirus 2 by RT PCR NEGATIVE NEGATIVE Final  ?  Comment: (NOTE) ?SARS-CoV-2 target nucleic acids are NOT DETECTED. ? ?The SARS-CoV-2 RNA is generally detectable in upper respiratory ?specimens during the acute phase of infection. The lowest ?concentration of SARS-CoV-2 viral copies this assay can detect is ?138 copies/mL. A negative result does not preclude SARS-Cov-2 ?infection and should not be used as the sole basis for treatment or ?other patient management decisions. A negative result may occur with  ?improper specimen collection/handling, submission of specimen other ?than nasopharyngeal swab, presence of viral mutation(s) within the ?areas targeted by this assay, and inadequate number of viral ?copies(<138 copies/mL). A negative result must be combined with ?clinical observations, patient history, and epidemiological ?information. The expected result is Negative. ? ?Fact Sheet for Patients:  ?EntrepreneurPulse.com.au ? ?Fact Sheet for Healthcare Providers:  ?IncredibleEmployment.be ? ?This test is no t yet approved or cleared by the [place] FDA and  ?has  been authorized for detection and/or diagnosis of SARS-CoV-2 by ?FDA under an Emergency Use Authorization (EUA). This EUA will remain  ?in effect (meaning this test can be used) for the duration of the ?COVID-19 declaration under Section 564(b)(1) of the Act, 21 ?U.S.C.section 360bbb-3(b)(1), unless the authorization is terminated  ?or revoked sooner.  ? ? ?  ? Influenza A by PCR NEGATIVE NEGATIVE Final  ? Influenza B by PCR NEGATIVE NEGATIVE Final  ?  Comment: (NOTE) ?The Xpert Xpress  SARS-CoV-2/FLU/RSV plus assay is intended as an aid ?in the diagnosis of influenza from Nasopharyngeal swab specimens and ?should not be used as a sole basis for treatment. Nasal washings and ?aspirates are unacceptable for Xpert Xpress SARS-CoV-2/FLU/RSV ?testing. ? ?Fact Sheet for Patients: ?EntrepreneurPulse.com.au ? ?Fact Sheet for Healthcare Providers: ?IncredibleEmployment.be ? ?This test is not yet approved or cleared by the [place] FDA and ?has been authorized for detection and/or diagnosis of SARS-CoV-2 by ?FDA under an Emergency Use Authorization (EUA). This EUA will remain ?in effect (meaning this test can be used) for the duration of the ?COVID-19 declaration under Section 564(b)(1) of the Act, 21 U.S.C. ?section 360bbb-3(b)(1), unless the authorization is terminated or ?revoked. ? ?Performed at [facility], [place], ?[place] [address] ?  ? ? ?Medical History: ?Past Medical History:  ?Diagnosis Date  ? Diabetes mellitus type 2, insulin dependent ([place])   ? HOH (hard of hearing)   ? Hypercholesteremia   ? Hypertension   ? ? ?Medications:  ? ? ?Assessment: ?Pharmacy consulted to monitor Na in this 81 year old male with hyponatremia.  Hypertonic saline ordered to start on 3/5 @ 0700 at 25 ml/hr.  ? ?3/4:  Na @ 2338 = 122 (baseline)  ? ?Goal of Therapy:  ?Na 135 - 145 ? ?Plan:  ?Will start hypertonic saline on 3/5 @ 0700. ?Will monitor Na Q2H X 2 and Q4H thereafter.  ?Will notify provider if Na rises:  > 4 mEq in 2 hrs ?                                                      > 6 mEq in 4 hrs ?                                                      > 8 mEq in 24 hrs  ? ?Aiva Miskell D ?06/14/2021,6:29 AM ? ? ? ?

## 2021-06-14 NOTE — TOC Progression Note (Signed)
Transition of Care (TOC) - Progression Note  ? ? ?Patient Details  ?Name: John Perez ?MRN: [id number] ?Date of Birth: 1940-05-14 ? ?Transition of Care (TOC) CM/SW Contact  ?Izola Price, RN ?Phone Number: ?06/14/2021, 10:53 AM ? ?Clinical Narrative:   3/5: [facility] notified via [place] for [facility] bed per daughter request and provider ordered. Progress notes sent for co-sign. Simmie Davies RN CM  ? ? ? ?  ?  ? ?Expected Discharge Plan and Services ?  ?  ?  ?  ?  ?                ?DME Arranged: Hospital bed ?DME Agency: [employer] ?Date DME Agency Contacted: 06/14/21 ?Time DME Agency Contacted: [phone] ?Representative spoke with at DME Agency: Delana Meyer ?  ?  ?  ?  ?  ? ? ?Social Determinants of Health (SDOH) Interventions ?  ? ?Readmission Risk Interventions ?No flowsheet data found. ? ?

## 2021-06-14 NOTE — Progress Notes (Signed)
Progress Note   Patient: John Perez WER:[id number] DOB: 09-25-1940 DOA: 06/10/2021     4 DOS: the patient was seen and examined on 06/14/2021   Brief hospital course: Mr. Yanke is an 81 yo male with PMH DM II, hard of hearing, HTN, HLD who presented to the ER today with worsening confusion at home. He was also seen in the ER on 06/09/2021 after a fall at home when walking down the steps.  It was reported that he lost his balance and fell forward. Head, neck, and shoulder imaging were negative for acute fractures. Head CT did show a 3.5 cm focus of low density in the left parietal lobe which was further evaluated with MRI brain and was noted to be hyperintense on T2 weighted signal which may suggest a possible low-grade neoplasm.  It was recommended to have repeat MRI brain in 6 to 12 weeks. No labs were ordered during that encounter.  Today when seen in the ER for his confusion he underwent further work-up and was found to have sodium 112. Other notable labs included BUN 16, creatinine 0.48, AST 106, ALT 52, total bili 1.4. WBC 14.8, hemoglobin 13.4 g/dL, hematocrit 35.9, platelets 253.  He was given a 1 L bag of saline in the ER.  Urinalysis was also obtained in the ER which showed large LE, greater than 50 WBC, many bacteria.  Assessment and Plan: * Hyponatremia - Na 112 on presentation; labs reviewed on care everywhere lowest recently seen was 124 on 05/29/2021 otherwise has been essentially over 130 historically - wife is confident that patient does not drink etoh. Home meds reviewed, he was recently started on Paxil after an office visit on 06/05/21 although would not expect adverse effect this rapidly. Other considered differentials would be excessive water intake vs the density in the parietal lobe concerning for possible malignancy as this could cause an SIADH hyponatremia picture vs a hormone abnormality (TSH, adrenals) - TSH is normal. Low suspicion for adrenal pathology as well - patient  continues to endorse he drinks excessive free water due to worrying about his diabetes being controlled; this is likely polydipsia in this context - FeNa also 0.1% on admission - course of 3% followed by LR then NS (Overall Na increased then started to downtrend with LR&NS which raised concern for SIADH - nephrology consulted on 3/5 and repeated urine studies (Ur Na increased from 12 to 123 on admission which also suggests SIADH) - I had restarted 3% morning of 3/5, but instead plan will now be continuing fluid restriction, stopping 3% and starting NaCl tabs per nephrology; appreciate assistance  - follow mentation response and continue trending Na levels  UTI (urinary tract infection) - UA noted with negative nitrite, large LE, greater than 50 WBC, many bacteria -Urine culture growing Serratia, sensitivities reviewed - Continue Rocephin, complete 5 days given lagging confusion likely confounded some by underlying hyponatremia  Abnormal LFTs - unclear etiology; possibly from cholestasis from infection  -Hepatitis panel negative - continue trending LFTs; downtrending some  Abnormal brain MRI - MRI brain on 2/28 shows ~3.5 cm hyperintense abnormal T2 weighted signal concerning for low-grade neoplasm -Discussed with neurology and he underwent repeat MRI brain with and without contrast on 06/13/2021.  Results showed likely an underlying benign process with repeat MRI brain recommended in 6 months - Follow-up any further neurology recommendations  Diabetes mellitus type 2, insulin dependent (HCC) - A1c 6.6% - Patient is on Lantus and sliding scale at home.  He also  follow strict dietary adherence to a carb consistent diet. -His A1c is essentially at goal and I would suspect he does not need basal insulin anymore possibly nor even sliding scale -Patient unable to state his actual metformin dose at home and family has to different lists.  Regardless, will increase metformin to max dose in efforts to  avoid any further need for insulin -He has barely been needed any sliding scale while hospitalized; and his home basal insulin has been on hold as well      Subjective: No events overnight.  Daughter present bedside this morning.  He is appearing a little more confused compared to yesterday.  He still does not know the year but today he also could not definitively tell me where he is although he did want to go home and tried to explain the reasoning for remaining in the hospital but due to confusion, he was having a hard time understanding this. Thorough update given to daughter and she understands plan is to see nephrology and neurology today.   Physical Exam: Vitals:   06/13/21 1625 06/13/21 2041 06/14/21 0413 06/14/21 0747  BP: (!) 162/82 (!) 161/76 (!) 172/82 (!) 156/74  Pulse: 70 66 67 (!) 58  Resp: 19 16 16 17   Temp: 98.2 F (36.8 C) (!) 97.3 F (36.3 C) (!) 97.4 F (36.3 C) 97.6 F (36.4 C)  TempSrc: Oral Oral Oral Oral  SpO2: 98% 94% 98% 99%  Height:       Physical Exam Constitutional:      General: He is not in acute distress.    Comments: A little more confused appearing today and fixated on going home  HENT:     Head: Normocephalic and atraumatic.     Mouth/Throat:     Mouth: Mucous membranes are moist.  Eyes:     Extraocular Movements: Extraocular movements intact.  Cardiovascular:     Rate and Rhythm: Normal rate and regular rhythm.     Heart sounds: Normal heart sounds.  Pulmonary:     Effort: Pulmonary effort is normal. No respiratory distress.     Breath sounds: Normal breath sounds. No wheezing.  Abdominal:     General: Bowel sounds are normal. There is no distension.     Palpations: Abdomen is soft.     Tenderness: There is no abdominal tenderness.  Musculoskeletal:        General: Normal range of motion.     Cervical back: Normal range of motion and neck supple.  Skin:    General: Skin is warm and dry.  Neurological:     Mental Status: He is  alert.     Comments: Oriented to name, president.  Still does not know year and today could not tell me where he is  Psychiatric:     Comments: Thought process a little more disorganized today     Data Reviewed: Results for orders placed or performed during the hospital encounter of 06/10/21 (from the past 24 hour(s))  Basic metabolic panel     Status: Abnormal   Collection Time: 06/13/21  1:01 PM  Result Value Ref Range   Sodium 124 (L) 135 - 145 mmol/L   Potassium 3.3 (L) 3.5 - 5.1 mmol/L   Chloride 92 (L) 98 - 111 mmol/L   CO2 25 22 - 32 mmol/L   Glucose, Bld 111 (H) 70 - 99 mg/dL   BUN 10 8 - 23 mg/dL   Creatinine, Ser 0.44 (L) 0.61 - 1.24 mg/dL  Calcium 8.2 (L) 8.9 - 10.3 mg/dL   GFR, Estimated >60 >60 mL/min   Anion gap 7 5 - 15  Glucose, capillary     Status: Abnormal   Collection Time: 06/13/21  4:28 PM  Result Value Ref Range   Glucose-Capillary 164 (H) 70 - 99 mg/dL  Sodium     Status: Abnormal   Collection Time: 06/13/21  6:00 PM  Result Value Ref Range   Sodium 124 (L) 135 - 145 mmol/L  Glucose, capillary     Status: Abnormal   Collection Time: 06/13/21  8:57 PM  Result Value Ref Range   Glucose-Capillary 156 (H) 70 - 99 mg/dL  Sodium     Status: Abnormal   Collection Time: 06/13/21 11:38 PM  Result Value Ref Range   Sodium 122 (L) 135 - 145 mmol/L  CBC with Differential/Platelet     Status: Abnormal   Collection Time: 06/14/21  6:56 AM  Result Value Ref Range   WBC 5.3 4.0 - 10.5 K/uL   RBC 3.34 (L) 4.22 - 5.81 MIL/uL   Hemoglobin 11.7 (L) 13.0 - 17.0 g/dL   HCT 32.0 (L) 39.0 - 52.0 %   MCV 95.8 80.0 - 100.0 fL   MCH 35.0 (H) 26.0 - 34.0 pg   MCHC 36.6 (H) 30.0 - 36.0 g/dL   RDW 11.3 (L) 11.5 - 15.5 %   Platelets 196 150 - 400 K/uL   nRBC 0.0 0.0 - 0.2 %   Neutrophils Relative % 76 %   Neutro Abs 4.0 1.7 - 7.7 K/uL   Lymphocytes Relative 11 %   Lymphs Abs 0.6 (L) 0.7 - 4.0 K/uL   Monocytes Relative 11 %   Monocytes Absolute 0.6 0.1 - 1.0 K/uL    Eosinophils Relative 1 %   Eosinophils Absolute 0.0 0.0 - 0.5 K/uL   Basophils Relative 0 %   Basophils Absolute 0.0 0.0 - 0.1 K/uL   Immature Granulocytes 1 %   Abs Immature Granulocytes 0.06 0.00 - 0.07 K/uL  Magnesium     Status: Abnormal   Collection Time: 06/14/21  6:56 AM  Result Value Ref Range   Magnesium 1.6 (L) 1.7 - 2.4 mg/dL  Comprehensive metabolic panel     Status: Abnormal   Collection Time: 06/14/21  6:56 AM  Result Value Ref Range   Sodium 128 (L) 135 - 145 mmol/L   Potassium 3.9 3.5 - 5.1 mmol/L   Chloride 90 (L) 98 - 111 mmol/L   CO2 25 22 - 32 mmol/L   Glucose, Bld 172 (H) 70 - 99 mg/dL   BUN 8 8 - 23 mg/dL   Creatinine, Ser 0.48 (L) 0.61 - 1.24 mg/dL   Calcium 8.1 (L) 8.9 - 10.3 mg/dL   Total Protein 5.6 (L) 6.5 - 8.1 g/dL   Albumin 3.1 (L) 3.5 - 5.0 g/dL   AST 53 (H) 15 - 41 U/L   ALT 45 (H) 0 - 44 U/L   Alkaline Phosphatase 64 38 - 126 U/L   Total Bilirubin 0.9 0.3 - 1.2 mg/dL   GFR, Estimated >60 >60 mL/min   Anion gap 13 5 - 15  Glucose, capillary     Status: Abnormal   Collection Time: 06/14/21  7:44 AM  Result Value Ref Range   Glucose-Capillary 162 (H) 70 - 99 mg/dL  Sodium     Status: Abnormal   Collection Time: 06/14/21  8:38 AM  Result Value Ref Range   Sodium 125 (L) 135 - 145  mmol/L  Osmolality, urine     Status: None   Collection Time: 06/14/21  9:23 AM  Result Value Ref Range   Osmolality, Ur 407 300 - 900 mOsm/kg  Sodium, urine, random     Status: None   Collection Time: 06/14/21  9:23 AM  Result Value Ref Range   Sodium, Ur 123 mmol/L  Creatinine, urine, random     Status: None   Collection Time: 06/14/21  9:23 AM  Result Value Ref Range   Creatinine, Urine 28 mg/dL  Glucose, capillary     Status: Abnormal   Collection Time: 06/14/21 11:45 AM  Result Value Ref Range   Glucose-Capillary 154 (H) 70 - 99 mg/dL    I have Reviewed nursing notes, Vitals, and Lab results since pt's last encounter. Pertinent lab results : see above I  have ordered test including BMP, CBC, Mg I have reviewed the last note from staff over past 24 hours I have discussed pt's care plan and test results with nursing staff, case manager   Family Communication: daughter   Disposition: Status is: Inpatient Remains inpatient appropriate because: Treatment as outlined in A&P     Planned Discharge Destination: Home  Antimicrobials: Rocephin 3/2 >> current  Consultants: Nephrology Neurology  Procedures:    DVT ppx:  enoxaparin (LOVENOX) injection 40 mg Start: 06/11/21 1000     Code Status: Full Code    Author: Dwyane Dee, MD 06/14/2021 12:16 PM  For on call review [email or web address].

## 2021-06-14 NOTE — Progress Notes (Signed)
?  ?  Durable Medical Equipment  ?(From admission, onward)  ?  ? ? ?  ? ?  Start     Ordered  ? 06/13/21 1318  For home use only DME Hospital bed  Once       ?Question Answer Comment  ?Length of Need 6 Months   ?Patient has (list medical condition): generalized muscle weakness   ?The above medical condition requires: Patient requires the ability to reposition frequently   ?Head must be elevated greater than: 30 degrees   ?Bed type Semi-electric   ?  ? 06/13/21 1318  ? ?  ?  ? ?  ?  ? ?

## 2021-06-14 NOTE — Progress Notes (Signed)
MEDICATION RELATED CONSULT NOTE - INITIAL  ? ?Pharmacy Consult for Hypertonic Saline  ?Indication: hyponatremia  ? ?No Known Allergies ? ?Patient Measurements: ?Height: 5\' 9"  (175.3 cm) ?IBW/kg (Calculated) : 70.7 ?Adjusted Body Weight:  ? ?Vital Signs: ?Temp: 97.6 ?F (36.4 ?C) (03/05 0747) ?Temp Source: Oral (03/05 0747) ?BP: 156/74 (03/05 0747) ?Pulse Rate: 58 (03/05 0747) ?Intake/Output from previous day: ?03/04 0701 - 03/05 0700 ?In: 2190.8 [P.O.:225; I.V.:1965.8] ?Out: 1275 [GURKY:7062] ?Intake/Output from this shift: ?No intake/output data recorded. ? ?Labs: ?Recent Labs  ?  06/12/21 ?3762 06/13/21 ?8315 06/13/21 ?1301 06/14/21 ?1761 06/14/21 ?6073  ?WBC 8.1 6.8  --  5.3  --   ?HGB 12.7* 11.9*  --  11.7*  --   ?HCT 34.1* 33.2*  --  32.0*  --   ?PLT 215 217  --  196  --   ?CREATININE 0.50* 0.50* 0.44* 0.48*  --   ?LABCREA  --   --   --   --  28  ?MG 1.7 1.7  --  1.6*  --   ?ALBUMIN 3.3* 3.3*  --  3.1*  --   ?PROT 5.6* 5.6*  --  5.6*  --   ?AST 79* 64*  --  53*  --   ?ALT 50* 48*  --  45*  --   ?ALKPHOS 67 67  --  64  --   ?BILITOT 1.2 1.0  --  0.9  --   ? ? ?Estimated Creatinine Clearance: 62.7 mL/min (A) (by C-G formula based on SCr of 0.48 mg/dL (L)). ? ? ?Microbiology: ?Recent Results (from the past 720 hour(s))  ?Urine Culture     Status: Abnormal  ? Collection Time: 06/10/21  3:42 PM  ? Specimen: Urine, Random  ?Result Value Ref Range Status  ? Specimen Description   Final  ?  URINE, RANDOM ?Performed at [facility], [address] [place], [place] [address] ?  ? Special Requests   Final  ?  Normal ?Performed at [facility], [address] [place], [place] [address] ?  ? Culture >=100,000 COLONIES/mL SERRATIA MARCESCENS (A)  Final  ? Report Status 06/13/2021 FINAL  Final  ? Organism ID, Bacteria SERRATIA MARCESCENS (A)  Final  ?    Susceptibility  ? Serratia marcescens - MIC*  ?  CEFAZOLIN >=64 RESISTANT Resistant   ?  CEFEPIME <=0.12 SENSITIVE Sensitive   ?  CEFTRIAXONE <=0.25  SENSITIVE Sensitive   ?  CIPROFLOXACIN <=0.25 SENSITIVE Sensitive   ?  GENTAMICIN <=1 SENSITIVE Sensitive   ?  NITROFURANTOIN 256 RESISTANT Resistant   ?  TRIMETH/SULFA <=20 SENSITIVE Sensitive   ?  * >=100,000 COLONIES/mL SERRATIA MARCESCENS  ?Resp Panel by RT-PCR (Flu A&B, Covid) Nasopharyngeal Swab     Status: None  ? Collection Time: 06/10/21  4:41 PM  ? Specimen: Nasopharyngeal Swab; Nasopharyngeal(NP) swabs in vial transport medium  ?Result Value Ref Range Status  ? SARS Coronavirus 2 by RT PCR NEGATIVE NEGATIVE Final  ?  Comment: (NOTE) ?SARS-CoV-2 target nucleic acids are NOT DETECTED. ? ?The SARS-CoV-2 RNA is generally detectable in upper respiratory ?specimens during the acute phase of infection. The lowest ?concentration of SARS-CoV-2 viral copies this assay can detect is ?138 copies/mL. A negative result does not preclude SARS-Cov-2 ?infection and should not be used as the sole basis for treatment or ?other patient management decisions. A negative result may occur with  ?improper specimen collection/handling, submission of specimen other ?than nasopharyngeal swab, presence of viral mutation(s) within the ?  areas targeted by this assay, and inadequate number of viral ?copies(<138 copies/mL). A negative result must be combined with ?clinical observations, patient history, and epidemiological ?information. The expected result is Negative. ? ?Fact Sheet for Patients:  ?EntrepreneurPulse.com.au ? ?Fact Sheet for Healthcare Providers:  ?IncredibleEmployment.be ? ?This test is no t yet approved or cleared by the [place] FDA and  ?has been authorized for detection and/or diagnosis of SARS-CoV-2 by ?FDA under an Emergency Use Authorization (EUA). This EUA will remain  ?in effect (meaning this test can be used) for the duration of the ?COVID-19 declaration under Section 564(b)(1) of the Act, 21 ?U.S.C.section 360bbb-3(b)(1), unless the authorization is terminated  ?or  revoked sooner.  ? ? ?  ? Influenza A by PCR NEGATIVE NEGATIVE Final  ? Influenza B by PCR NEGATIVE NEGATIVE Final  ?  Comment: (NOTE) ?The Xpert Xpress SARS-CoV-2/FLU/RSV plus assay is intended as an aid ?in the diagnosis of influenza from Nasopharyngeal swab specimens and ?should not be used as a sole basis for treatment. Nasal washings and ?aspirates are unacceptable for Xpert Xpress SARS-CoV-2/FLU/RSV ?testing. ? ?Fact Sheet for Patients: ?EntrepreneurPulse.com.au ? ?Fact Sheet for Healthcare Providers: ?IncredibleEmployment.be ? ?This test is not yet approved or cleared by the [place] FDA and ?has been authorized for detection and/or diagnosis of SARS-CoV-2 by ?FDA under an Emergency Use Authorization (EUA). This EUA will remain ?in effect (meaning this test can be used) for the duration of the ?COVID-19 declaration under Section 564(b)(1) of the Act, 21 U.S.C. ?section 360bbb-3(b)(1), unless the authorization is terminated or ?revoked. ? ?Performed at [facility], [place], ?[place] [address] ?  ? ? ?Medical History: ?Past Medical History:  ?Diagnosis Date  ? Diabetes mellitus type 2, insulin dependent ([place])   ? HOH (hard of hearing)   ? Hypercholesteremia   ? Hypertension   ? ? ?Medications:  ? ? ?Assessment: ?Pharmacy consulted to monitor Na in this 81 year old male with hyponatremia.  Hypertonic saline ordered to start on 3/5 @ 0700 at 25 ml/hr.  ? ?3/4:  Na @ 2338 = 122 (baseline)  ?3/5:  Na @ 0656 = 128 (erroneous reading d/t being drawn from same line as 3%NaCl infusion) ?3/5:  Na @ 0838 = 125 ? ?Goal of Therapy:  ?Na 135 - 145 ? ?Plan:  ?Hypertonic saline started@0654  on 3/5 @ 25 ml/hr. ?Will monitor Na Q2H X 2 and Q4H thereafter.  ?Will notify provider if Na rises:  > 4 mEq in 2 hrs ?                                                      > 6 mEq in 4 hrs ?                                                      > 8 mEq in 24 hrs  ? ?Iker Nuttall A  Langley Ingalls ?06/14/2021,10:36 AM ? ? ? ?

## 2021-06-15 DIAGNOSIS — E871 Hypo-osmolality and hyponatremia: Secondary | ICD-10-CM | POA: Diagnosis not present

## 2021-06-15 DIAGNOSIS — R7989 Other specified abnormal findings of blood chemistry: Secondary | ICD-10-CM | POA: Diagnosis not present

## 2021-06-15 DIAGNOSIS — R4182 Altered mental status, unspecified: Secondary | ICD-10-CM | POA: Diagnosis not present

## 2021-06-15 LAB — GLUCOSE, CAPILLARY
Glucose-Capillary: 184 mg/dL — ABNORMAL HIGH (ref 70–99)
Glucose-Capillary: 127 mg/dL — ABNORMAL HIGH (ref 70–99)
Glucose-Capillary: 135 mg/dL — ABNORMAL HIGH (ref 70–99)
Glucose-Capillary: 195 mg/dL — ABNORMAL HIGH (ref 70–99)

## 2021-06-15 LAB — CBC WITH DIFFERENTIAL/PLATELET
Abs Immature Granulocytes: 0.05 10*3/uL (ref 0.00–0.07)
Basophils Absolute: 0 10*3/uL (ref 0.0–0.1)
Basophils Relative: 0 %
Eosinophils Absolute: 0 10*3/uL (ref 0.0–0.5)
Eosinophils Relative: 1 %
HCT: 32.8 % — ABNORMAL LOW (ref 39.0–52.0)
Hemoglobin: 11.6 g/dL — ABNORMAL LOW (ref 13.0–17.0)
Immature Granulocytes: 1 %
Lymphocytes Relative: 11 %
Lymphs Abs: 0.6 10*3/uL — ABNORMAL LOW (ref 0.7–4.0)
MCH: 34.1 pg — ABNORMAL HIGH (ref 26.0–34.0)
MCHC: 35.4 g/dL (ref 30.0–36.0)
MCV: 96.5 fL (ref 80.0–100.0)
Monocytes Absolute: 0.6 10*3/uL (ref 0.1–1.0)
Monocytes Relative: 11 %
Neutro Abs: 4.4 10*3/uL (ref 1.7–7.7)
Neutrophils Relative %: 76 %
Platelets: 209 10*3/uL (ref 150–400)
RBC: 3.4 MIL/uL — ABNORMAL LOW (ref 4.22–5.81)
RDW: 11.3 % — ABNORMAL LOW (ref 11.5–15.5)
WBC: 5.7 10*3/uL (ref 4.0–10.5)
nRBC: 0 % (ref 0.0–0.2)

## 2021-06-15 LAB — COMPREHENSIVE METABOLIC PANEL
ALT: 42 U/L (ref 0–44)
AST: 48 U/L — ABNORMAL HIGH (ref 15–41)
Albumin: 3.2 g/dL — ABNORMAL LOW (ref 3.5–5.0)
Alkaline Phosphatase: 70 U/L (ref 38–126)
Anion gap: 10 (ref 5–15)
BUN: 11 mg/dL (ref 8–23)
CO2: 24 mmol/L (ref 22–32)
Calcium: 8.2 mg/dL — ABNORMAL LOW (ref 8.9–10.3)
Chloride: 93 mmol/L — ABNORMAL LOW (ref 98–111)
Creatinine, Ser: 0.46 mg/dL — ABNORMAL LOW (ref 0.61–1.24)
GFR, Estimated: >60 mL/min (ref >60)
Glucose, Bld: 176 mg/dL — ABNORMAL HIGH (ref 70–99)
Potassium: 3.4 mmol/L — ABNORMAL LOW (ref 3.5–5.1)
Sodium: 127 mmol/L — ABNORMAL LOW (ref 135–145)
Total Bilirubin: 1 mg/dL (ref 0.3–1.2)
Total Protein: 5.6 g/dL — ABNORMAL LOW (ref 6.5–8.1)

## 2021-06-15 LAB — SODIUM
Sodium: 127 mmol/L — ABNORMAL LOW (ref 135–145)
Sodium: 127 mmol/L — ABNORMAL LOW (ref 135–145)
Sodium: 127 mmol/L — ABNORMAL LOW (ref 135–145)
Sodium: 128 mmol/L — ABNORMAL LOW (ref 135–145)
Sodium: 130 mmol/L — ABNORMAL LOW (ref 135–145)

## 2021-06-15 LAB — MAGNESIUM: Magnesium: 1.6 mg/dL — ABNORMAL LOW (ref 1.7–2.4)

## 2021-06-15 MED ORDER — MELATONIN 5 MG PO TABS
5.0000 mg | ORAL_TABLET | Freq: Every day | ORAL | Status: DC
  Administered 2021-06-15 – 2021-06-16 (×2): 5 mg via ORAL
  Filled 2021-06-15 (×2): qty 1

## 2021-06-15 MED ORDER — POTASSIUM CHLORIDE CRYS ER 20 MEQ PO TBCR
40.0000 meq | EXTENDED_RELEASE_TABLET | Freq: Once | ORAL | Status: AC
  Administered 2021-06-15: 40 meq via ORAL
  Filled 2021-06-15: qty 2

## 2021-06-15 MED ORDER — SALINE SPRAY 0.65 % NA SOLN
1.0000 | NASAL | Status: DC | PRN
  Filled 2021-06-15 (×2): qty 44

## 2021-06-15 MED ORDER — MAGNESIUM SULFATE 2 GM/50ML IV SOLN
2.0000 g | Freq: Once | INTRAVENOUS | Status: AC
  Administered 2021-06-15: 2 g via INTRAVENOUS
  Filled 2021-06-15: qty 50

## 2021-06-15 MED ORDER — FUROSEMIDE 20 MG PO TABS
10.0000 mg | ORAL_TABLET | Freq: Every day | ORAL | Status: DC
  Administered 2021-06-16 – 2021-06-17 (×2): 10 mg via ORAL
  Filled 2021-06-15 (×3): qty 1

## 2021-06-15 MED ORDER — TRAZODONE HCL 50 MG PO TABS
50.0000 mg | ORAL_TABLET | Freq: Every evening | ORAL | Status: DC | PRN
  Administered 2021-06-15 – 2021-06-16 (×2): 50 mg via ORAL
  Filled 2021-06-15 (×2): qty 1

## 2021-06-15 NOTE — Procedures (Signed)
Patient Name: John Perez  ?MRN: [id number]  ?Epilepsy Attending: Lora Havens  ?Referring Physician/Provider: Kerney Elbe, MD ?Date: 06/15/2021 ?Duration: 21.06 mins ? ?Patient history: 81 year old male with fluctuating altered mental status, MRI brain showed left parietal lesion.  EEG to evaluate for seizure. ? ?Level of alertness: Awake ? ?AEDs during EEG study: None ? ?Technical aspects: This EEG study was done with scalp electrodes positioned according to the 10-20 International system of electrode placement. Electrical activity was acquired at a sampling rate of 500Hz  and reviewed with a high frequency filter of 70Hz  and a low frequency filter of 1Hz . EEG data were recorded continuously and digitally stored.  ? ?Description: The posterior dominant rhythm consists of 8 Hz activity of moderate voltage (25-35 uV) seen predominantly in posterior head regions, symmetric and reactive to eye opening and eye closing. Physiologic photic driving was not seen during photic stimulation.  Hyperventilation was not performed.    ? ?IMPRESSION: ?This study is within normal limits. No seizures or epileptiform discharges were seen throughout the recording. ? ?Lora Havens  ? ?

## 2021-06-15 NOTE — Progress Notes (Signed)
Eeg done 

## 2021-06-15 NOTE — Progress Notes (Signed)
[facility]  ROUNDING NOTE   Subjective:   Patient seen sitting up in bed, family at bedside Alert and oriented Anxious for discharge Tolerating small meals without nausea and vomiting  Sodium 127 Urine output 2 L in 24 hours  Objective:  Vital signs in last 24 hours:  Temp:  [97.9 F (36.6 C)-98.6 F (37 C)] 97.9 F (36.6 C) (03/06 0735) Pulse Rate:  [64-79] 66 (03/06 0735) Resp:  [15-18] 18 (03/06 0735) BP: (152-171)/(74-86) 171/78 (03/06 0735) SpO2:  [96 %-100 %] 99 % (03/06 0735)  Weight change:  There were no vitals filed for this visit.  Intake/Output: I/O last 3 completed shifts: In: 2065.8 [I.V.:1965.8; IV Piggyback:100] Out: 6812 [Urine:3325]   Intake/Output this shift:  Total I/O In: 120 [P.O.:120] Out: -   Physical Exam: General: NAD, resting comfortably  Head: Normocephalic, atraumatic. Moist oral mucosal membranes  Eyes: Anicteric  Lungs:  Clear to auscultation, normal effort, room air  Heart: Regular rate and rhythm  Abdomen:  Soft, nontender  Extremities: No peripheral edema.  Neurologic: Nonfocal, moving all four extremities  Skin: No lesions       Basic Metabolic Panel: Recent Labs  Lab 06/11/21 0200 06/11/21 0600 06/12/21 0519 06/12/21 1215 06/13/21 0639 06/13/21 1301 06/13/21 1800 06/14/21 0656 06/14/21 7517 06/14/21 1955 06/15/21 0021 06/15/21 0457 06/15/21 0825 06/15/21 1229  NA 120*   < > 124*   < > 126* 124*   < > 128*   < > 128* 128* 127* 127* 130*  K 3.4*  --  3.6  --  3.7 3.3*  --  3.9  --   --   --  3.4*  --   --   CL 90*  --  91*  --  93* 92*  --  90*  --   --   --  93*  --   --   CO2 24  --  25  --  26 25  --  25  --   --   --  24  --   --   GLUCOSE 80  --  188*  --  203* 111*  --  172*  --   --   --  176*  --   --   BUN 12  --  9  --  9 10  --  8  --   --   --  11  --   --   CREATININE 0.43*  --  0.50*  --  0.50* 0.44*  --  0.48*  --   --   --  0.46*  --   --   CALCIUM 7.7*  --  8.2*  --  8.0* 8.2*   --  8.1*  --   --   --  8.2*  --   --   MG 1.8  --  1.7  --  1.7  --   --  1.6*  --   --   --  1.6*  --   --    < > = values in this interval not displayed.    Liver Function Tests: Recent Labs  Lab 06/11/21 0200 06/12/21 0519 06/13/21 0639 06/14/21 0656 06/15/21 0457  AST 102* 79* 64* 53* 48*  ALT 48* 50* 48* 45* 42  ALKPHOS 67 67 67 64 70  BILITOT 1.1 1.2 1.0 0.9 1.0  PROT 5.5* 5.6* 5.6* 5.6* 5.6*  ALBUMIN 3.3* 3.3* 3.3* 3.1* 3.2*   No results for input(s): LIPASE, AMYLASE  in the last 168 hours. Recent Labs  Lab 06/14/21 1536  AMMONIA 28    CBC: Recent Labs  Lab 06/11/21 0200 06/12/21 0519 06/13/21 0639 06/14/21 0656 06/15/21 0457  WBC 8.3 8.1 6.8 5.3 5.7  NEUTROABS 6.6 6.4 5.4 4.0 4.4  HGB 12.1* 12.7* 11.9* 11.7* 11.6*  HCT 32.3* 34.1* 33.2* 32.0* 32.8*  MCV 93.4 94.7 95.7 95.8 96.5  PLT 214 215 217 196 209    Cardiac Enzymes: No results for input(s): CKTOTAL, CKMB, CKMBINDEX, TROPONINI in the last 168 hours.  BNP: Invalid input(s): POCBNP  CBG: Recent Labs  Lab 06/14/21 1145 06/14/21 1638 06/14/21 2053 06/15/21 0736 06/15/21 1245  GLUCAP 154* 132* 156* 184* 127*    Microbiology: Results for orders placed or performed during the hospital encounter of 06/10/21  Urine Culture     Status: Abnormal   Collection Time: 06/10/21  3:42 PM   Specimen: Urine, Random  Result Value Ref Range Status   Specimen Description   Final    URINE, RANDOM Performed at [facility], [address], [place], [place] [address]    Special Requests   Final    Normal Performed at [facility], [address] [place], [place] [address]    Culture >=100,000 COLONIES/mL SERRATIA MARCESCENS (A)  Final   Report Status 06/13/2021 FINAL  Final   Organism ID, Bacteria SERRATIA MARCESCENS (A)  Final      Susceptibility   Serratia marcescens - MIC*    CEFAZOLIN >=64 RESISTANT Resistant     CEFEPIME <=0.12 SENSITIVE Sensitive     CEFTRIAXONE <=0.25  SENSITIVE Sensitive     CIPROFLOXACIN <=0.25 SENSITIVE Sensitive     GENTAMICIN <=1 SENSITIVE Sensitive     NITROFURANTOIN 256 RESISTANT Resistant     TRIMETH/SULFA <=20 SENSITIVE Sensitive     * >=100,000 COLONIES/mL SERRATIA MARCESCENS  Resp Panel by RT-PCR (Flu A&B, Covid) Nasopharyngeal Swab     Status: None   Collection Time: 06/10/21  4:41 PM   Specimen: Nasopharyngeal Swab; Nasopharyngeal(NP) swabs in vial transport medium  Result Value Ref Range Status   SARS Coronavirus 2 by RT PCR NEGATIVE NEGATIVE Final    Comment: (NOTE) SARS-CoV-2 target nucleic acids are NOT DETECTED.  The SARS-CoV-2 RNA is generally detectable in upper respiratory specimens during the acute phase of infection. The lowest concentration of SARS-CoV-2 viral copies this assay can detect is 138 copies/mL. A negative result does not preclude SARS-Cov-2 infection and should not be used as the sole basis for treatment or other patient management decisions. A negative result may occur with  improper specimen collection/handling, submission of specimen other than nasopharyngeal swab, presence of viral mutation(s) within the areas targeted by this assay, and inadequate number of viral copies(<138 copies/mL). A negative result must be combined with clinical observations, patient history, and epidemiological information. The expected result is Negative.  Fact Sheet for Patients:  EntrepreneurPulse.com.au  Fact Sheet for Healthcare Providers:  IncredibleEmployment.be  This test is no t yet approved or cleared by the [place] FDA and  has been authorized for detection and/or diagnosis of SARS-CoV-2 by FDA under an Emergency Use Authorization (EUA). This EUA will remain  in effect (meaning this test can be used) for the duration of the COVID-19 declaration under Section 564(b)(1) of the Act, 21 U.S.C.section 360bbb-3(b)(1), unless the authorization is terminated  or  revoked sooner.       Influenza A by PCR NEGATIVE NEGATIVE Final   Influenza B by PCR NEGATIVE NEGATIVE  Final    Comment: (NOTE) The Xpert Xpress SARS-CoV-2/FLU/RSV plus assay is intended as an aid in the diagnosis of influenza from Nasopharyngeal swab specimens and should not be used as a sole basis for treatment. Nasal washings and aspirates are unacceptable for Xpert Xpress SARS-CoV-2/FLU/RSV testing.  Fact Sheet for Patients: EntrepreneurPulse.com.au  Fact Sheet for Healthcare Providers: IncredibleEmployment.be  This test is not yet approved or cleared by the [place] FDA and has been authorized for detection and/or diagnosis of SARS-CoV-2 by FDA under an Emergency Use Authorization (EUA). This EUA will remain in effect (meaning this test can be used) for the duration of the COVID-19 declaration under Section 564(b)(1) of the Act, 21 U.S.C. section 360bbb-3(b)(1), unless the authorization is terminated or revoked.  Performed at [facility], [address] [place], [place] [address]     Coagulation Studies: No results for input(s): LABPROT, INR in the last 72 hours.  Urinalysis: No results for input(s): COLORURINE, LABSPEC, PHURINE, GLUCOSEU, HGBUR, BILIRUBINUR, KETONESUR, PROTEINUR, UROBILINOGEN, NITRITE, LEUKOCYTESUR in the last 72 hours.  Invalid input(s): APPERANCEUR    Imaging: MR BRAIN W WO CONTRAST  Result Date: 06/13/2021 CLINICAL DATA:  Brain/CNS neoplasm, staging Neuro deficit, persistent/recurrent, CNS neoplasm suspected further evaluation possible neoplasm L parietal lobe seen on noncon MRI. EXAM: MRI HEAD WITHOUT AND WITH CONTRAST TECHNIQUE: Multiplanar, multiecho pulse sequences of the brain and surrounding structures were obtained without and with intravenous contrast. CONTRAST:  7.78mL GADAVIST GADOBUTROL 1 MMOL/ML IV SOLN COMPARISON:  Brain MRI 06/09/2021 FINDINGS: Brain: No acute infarct, mass effect or  extra-axial collection. No acute or chronic hemorrhage. Hyperintense T2-weighted signal in the left parietal white matter. Generalized volume loss. The midline structures are normal. There is no abnormal contrast enhancement at the site of the left parietal abnormality. Vascular: Major flow voids are preserved. Skull and upper cervical spine: Normal calvarium and skull base. Visualized upper cervical spine and soft tissues are normal. Sinuses/Orbits:Left mastoid effusion.  Normal orbits. IMPRESSION: 1. No abnormal contrast enhancement at the site of the left parietal abnormality. This is favored to be a benign process such as chronic ischemia. However, a follow-up brain MRI in 6 months is recommended to ensure stability. 2. Generalized volume loss and findings of chronic microvascular disease. 3. Left mastoid effusion. Electronically Signed   By: Ulyses Jarred M.D.   On: 06/13/2021 20:00   EEG adult  Result Date: 06/15/2021 Lora Havens, MD     06/15/2021  1:56 PM Patient Name: John Perez MRN: [id number] Epilepsy Attending: Lora Havens Referring Physician/Provider: Kerney Elbe, MD Date: 06/15/2021 Duration: 21.06 mins Patient history: 81 year old male with fluctuating altered mental status, MRI brain showed left parietal lesion.  EEG to evaluate for seizure. Level of alertness: Awake AEDs during EEG study: None Technical aspects: This EEG study was done with scalp electrodes positioned according to the 10-20 International system of electrode placement. Electrical activity was acquired at a sampling rate of 500Hz  and reviewed with a high frequency filter of 70Hz  and a low frequency filter of 1Hz . EEG data were recorded continuously and digitally stored. Description: The posterior dominant rhythm consists of 8 Hz activity of moderate voltage (25-35 uV) seen predominantly in posterior head regions, symmetric and reactive to eye opening and eye closing. Physiologic photic driving was not seen during photic  stimulation.  Hyperventilation was not performed.   IMPRESSION: This study is within normal limits. No seizures or epileptiform discharges were seen throughout the recording. Priyanka Barbra Sarks  Medications:     aspirin  81 mg Oral Daily   benazepril  20 mg Oral Daily   enoxaparin (LOVENOX) injection  40 mg Subcutaneous Daily   finasteride  5 mg Oral Daily   furosemide  10 mg Oral BID   insulin aspart  0-5 Units Subcutaneous QHS   insulin aspart  0-9 Units Subcutaneous TID WC   melatonin  5 mg Oral QHS   metFORMIN  1,000 mg Oral BID WC   PARoxetine  10 mg Oral Daily   sodium chloride  2 g Oral BID WC   tamsulosin  0.4 mg Oral Daily   acetaminophen **OR** acetaminophen, traZODone  Assessment/ Plan:  John Perez is a 81 y.o.  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  Hypovolemic Patient has history of chronic hyponatremia Patient has had sodium low going back to 2021  Sodium maintain 127.  Continue current treatment with fluid restriction, Lasix and sodium chloride tablets.  Sodium goal 128-130.  2.  Hypertension.  Currently receiving benazepril, tamsulosin and furosemide.  BP remains elevated 171/78.  3.  Diabetes mellitus type 2, unspecified.  Home regimen includes Lantus, NovoLog, and metformin.  Primary team to manage sliding scale insulin.  Glucose stable during this admission    LOS: 5   3/6/20232:21 PM

## 2021-06-15 NOTE — Progress Notes (Signed)
Progress Note   Patient: John Perez YHC:[id number] DOB: [birth year] DOA: 06/10/2021     5 DOS: the patient was seen and examined on 06/15/2021   Brief hospital course: Mr. Gover is an 81 yo male with PMH DM II, hard of hearing, HTN, HLD who presented to the ER today with worsening confusion at home. He was also seen in the ER on 06/09/2021 after a fall at home when walking down the steps.  It was reported that he lost his balance and fell forward. Head, neck, and shoulder imaging were negative for acute fractures. Head CT did show a 3.5 cm focus of low density in the left parietal lobe which was further evaluated with MRI brain and was noted to be hyperintense on T2 weighted signal which may suggest a possible low-grade neoplasm.  It was recommended to have repeat MRI brain in 6 to 12 weeks. No labs were ordered during that encounter.  Today when seen in the ER for his confusion he underwent further work-up and was found to have sodium 112. Other notable labs included BUN 16, creatinine 0.48, AST 106, ALT 52, total bili 1.4. WBC 14.8, hemoglobin 13.4 g/dL, hematocrit 35.9, platelets 253.  He was given a 1 L bag of saline in the ER.  Urinalysis was also obtained in the ER which showed large LE, greater than 50 WBC, many bacteria.  Assessment and Plan: * Hyponatremia - Na 112 on presentation; labs reviewed on care everywhere lowest recently seen was 124 on 05/29/2021 otherwise has been essentially over 130 historically - wife is confident that patient does not drink etoh. Home meds reviewed, he was recently started on Paxil after an office visit on 06/05/21 although would not expect adverse effect this rapidly. Other considered differentials would be excessive water intake vs the density in the parietal lobe concerning for possible malignancy as this could cause an SIADH hyponatremia picture vs a hormone abnormality (TSH, adrenals) - TSH is normal. Low suspicion for adrenal pathology as well - patient  continues to endorse he drinks excessive free water due to worrying about his diabetes being controlled; this is likely polydipsia in this context - FeNa also 0.1% on admission - course of 3% followed by LR then NS (Overall Na increased then started to downtrend with LR&NS which raised concern for SIADH - nephrology consulted on 3/5 and repeated urine studies (Ur Na increased from 12 to 123 on admission which also suggests SIADH) - I had restarted 3% morning of 3/5, but instead plan will now be continuing fluid restriction, stopping 3% and starting NaCl tabs per nephrology; appreciate assistance  - follow mentation response and continue trending Na levels  UTI (urinary tract infection)-resolved as of 06/15/2021 - UA noted with negative nitrite, large LE, greater than 50 WBC, many bacteria -Urine culture growing Serratia, sensitivities reviewed - Continue Rocephin, completed 5 days on 3/5, given lagging confusion likely confounded some by underlying hyponatremia  Abnormal LFTs - unclear etiology; possibly from cholestasis from infection  -Hepatitis panel negative - continue trending LFTs which have been consistently downtrending   Abnormal brain MRI - MRI brain on 2/28 shows ~3.5 cm hyperintense abnormal T2 weighted signal concerning for low-grade neoplasm -Discussed with neurology and he underwent repeat MRI brain with and without contrast on 06/13/2021.  Results showed likely an underlying benign process with repeat MRI brain recommended in 3 months per neurology - appreciate neurology evaluation as well - B12 very high but given water solubility I do not think toxicity  is a factor? - normal NH3 - follow up pending B1 level   Diabetes mellitus type 2, insulin dependent (HCC) - A1c 6.6% - Patient is on Lantus and sliding scale at home.  He also follows strict dietary adherence to a carb consistent diet. -His A1c is essentially at goal and I would suspect he does not need basal insulin anymore  possibly nor even sliding scale -Patient unable to state his actual metformin dose at home and family has to different lists.  Regardless, will increase metformin to max dose in efforts to avoid any further need for insulin -He has barely been needed any sliding scale while hospitalized; and his home basal insulin has been on hold as well  - I've discussed at length with patient and family that at discharge he should not be continued on Lantus; only sliding scale at most and to continue metformin 2g BID and follow up with endocrinology      Subjective: No events overnight.  Family present bedside this morning.  They were appreciative of neurology and nephrology evaluations. Patient seems to be doing relatively okay, still some confusion.  He still does not know the year but today was able to answer everything else.  Yesterday he did not know the year or where he was. Sodium is up to 127 after further fluid restriction and salt tablets and Lasix started yesterday.   Physical Exam: Vitals:   06/14/21 2051 06/14/21 2356 06/15/21 0355 06/15/21 0735  BP: (!) 165/86 (!) 152/79 (!) 163/74 (!) 171/78  Pulse: 79 64 72 66  Resp: 16 16 16 18   Temp: 98.6 F (37 C)  98.2 F (36.8 C) 97.9 F (36.6 C)  TempSrc: Oral  Oral Oral  SpO2: 100% 98% 96% 99%  Height:       Physical Exam Constitutional:      General: He is not in acute distress.    Comments: Still confused but some improvement compared to yesterday.  Calm and cooperative.  Family bedside  HENT:     Head: Normocephalic and atraumatic.     Mouth/Throat:     Mouth: Mucous membranes are moist.  Eyes:     Extraocular Movements: Extraocular movements intact.  Cardiovascular:     Rate and Rhythm: Normal rate and regular rhythm.     Heart sounds: Normal heart sounds.  Pulmonary:     Effort: Pulmonary effort is normal. No respiratory distress.     Breath sounds: Normal breath sounds. No wheezing.  Abdominal:     General: Bowel sounds are  normal. There is no distension.     Palpations: Abdomen is soft.     Tenderness: There is no abdominal tenderness.  Musculoskeletal:        General: Normal range of motion.     Cervical back: Normal range of motion and neck supple.  Skin:    General: Skin is warm and dry.  Neurological:     Mental Status: He is alert.     Comments: Oriented to name, president, month, and place. Still does not know year  Psychiatric:     Comments: Thought process has been disorganized but slight improvement today     Data Reviewed: Results for orders placed or performed during the hospital encounter of 06/10/21 (from the past 24 hour(s))  Glucose, capillary     Status: Abnormal   Collection Time: 06/14/21 11:45 AM  Result Value Ref Range   Glucose-Capillary 154 (H) 70 - 99 mg/dL  Sodium  Status: Abnormal   Collection Time: 06/14/21 12:30 PM  Result Value Ref Range   Sodium 127 (L) 135 - 145 mmol/L  Osmolality     Status: Abnormal   Collection Time: 06/14/21 12:30 PM  Result Value Ref Range   Osmolality 266 (L) 275 - 295 mOsm/kg  Sodium     Status: Abnormal   Collection Time: 06/14/21  3:36 PM  Result Value Ref Range   Sodium 127 (L) 135 - 145 mmol/L  Vitamin B12     Status: Abnormal   Collection Time: 06/14/21  3:36 PM  Result Value Ref Range   Vitamin B-12 6,805 (H) 180 - 914 pg/mL  Ammonia     Status: None   Collection Time: 06/14/21  3:36 PM  Result Value Ref Range   Ammonia 28 9 - 35 umol/L  Glucose, capillary     Status: Abnormal   Collection Time: 06/14/21  4:38 PM  Result Value Ref Range   Glucose-Capillary 132 (H) 70 - 99 mg/dL  Sodium     Status: Abnormal   Collection Time: 06/14/21  7:55 PM  Result Value Ref Range   Sodium 128 (L) 135 - 145 mmol/L  Uric acid     Status: Abnormal   Collection Time: 06/14/21  7:55 PM  Result Value Ref Range   Uric Acid, Serum 2.7 (L) 3.7 - 8.6 mg/dL  Glucose, capillary     Status: Abnormal   Collection Time: 06/14/21  8:53 PM  Result  Value Ref Range   Glucose-Capillary 156 (H) 70 - 99 mg/dL  Sodium     Status: Abnormal   Collection Time: 06/15/21 12:21 AM  Result Value Ref Range   Sodium 128 (L) 135 - 145 mmol/L  CBC with Differential/Platelet     Status: Abnormal   Collection Time: 06/15/21  4:57 AM  Result Value Ref Range   WBC 5.7 4.0 - 10.5 K/uL   RBC 3.40 (L) 4.22 - 5.81 MIL/uL   Hemoglobin 11.6 (L) 13.0 - 17.0 g/dL   HCT 32.8 (L) 39.0 - 52.0 %   MCV 96.5 80.0 - 100.0 fL   MCH 34.1 (H) 26.0 - 34.0 pg   MCHC 35.4 30.0 - 36.0 g/dL   RDW 11.3 (L) 11.5 - 15.5 %   Platelets 209 150 - 400 K/uL   nRBC 0.0 0.0 - 0.2 %   Neutrophils Relative % 76 %   Neutro Abs 4.4 1.7 - 7.7 K/uL   Lymphocytes Relative 11 %   Lymphs Abs 0.6 (L) 0.7 - 4.0 K/uL   Monocytes Relative 11 %   Monocytes Absolute 0.6 0.1 - 1.0 K/uL   Eosinophils Relative 1 %   Eosinophils Absolute 0.0 0.0 - 0.5 K/uL   Basophils Relative 0 %   Basophils Absolute 0.0 0.0 - 0.1 K/uL   Immature Granulocytes 1 %   Abs Immature Granulocytes 0.05 0.00 - 0.07 K/uL  Magnesium     Status: Abnormal   Collection Time: 06/15/21  4:57 AM  Result Value Ref Range   Magnesium 1.6 (L) 1.7 - 2.4 mg/dL  Comprehensive metabolic panel     Status: Abnormal   Collection Time: 06/15/21  4:57 AM  Result Value Ref Range   Sodium 127 (L) 135 - 145 mmol/L   Potassium 3.4 (L) 3.5 - 5.1 mmol/L   Chloride 93 (L) 98 - 111 mmol/L   CO2 24 22 - 32 mmol/L   Glucose, Bld 176 (H) 70 - 99 mg/dL   BUN  11 8 - 23 mg/dL   Creatinine, Ser 0.46 (L) 0.61 - 1.24 mg/dL   Calcium 8.2 (L) 8.9 - 10.3 mg/dL   Total Protein 5.6 (L) 6.5 - 8.1 g/dL   Albumin 3.2 (L) 3.5 - 5.0 g/dL   AST 48 (H) 15 - 41 U/L   ALT 42 0 - 44 U/L   Alkaline Phosphatase 70 38 - 126 U/L   Total Bilirubin 1.0 0.3 - 1.2 mg/dL   GFR, Estimated >60 >60 mL/min   Anion gap 10 5 - 15  Glucose, capillary     Status: Abnormal   Collection Time: 06/15/21  7:36 AM  Result Value Ref Range   Glucose-Capillary 184 (H) 70 - 99  mg/dL  Sodium     Status: Abnormal   Collection Time: 06/15/21  8:25 AM  Result Value Ref Range   Sodium 127 (L) 135 - 145 mmol/L    I have Reviewed nursing notes, Vitals, and Lab results since pt's last encounter. Pertinent lab results : see above I have ordered test including BMP, CBC, Mg I have reviewed the last note from staff over past 24 hours I have discussed pt's care plan and test results with nursing staff, case manager   Family Communication: daughter   Disposition: Status is: Inpatient Remains inpatient appropriate because: Treatment as outlined in A&P     Planned Discharge Destination: Home  Antimicrobials: Rocephin 3/2 >> 06/14/2021  Consultants: Nephrology Neurology  Procedures:    DVT ppx:  enoxaparin (LOVENOX) injection 40 mg Start: 06/11/21 1000     Code Status: Full Code    Author: Dwyane Dee, MD 06/15/2021 10:57 AM  For on call review [email or web address].

## 2021-06-16 ENCOUNTER — Inpatient Hospital Stay: Payer: Medicare HMO

## 2021-06-16 DIAGNOSIS — E871 Hypo-osmolality and hyponatremia: Secondary | ICD-10-CM | POA: Diagnosis not present

## 2021-06-16 LAB — CBC WITH DIFFERENTIAL/PLATELET
Abs Immature Granulocytes: 0.06 10*3/uL (ref 0.00–0.07)
Basophils Absolute: 0 10*3/uL (ref 0.0–0.1)
Basophils Relative: 0 %
Eosinophils Absolute: 0.1 10*3/uL (ref 0.0–0.5)
Eosinophils Relative: 1 %
HCT: 31.8 % — ABNORMAL LOW (ref 39.0–52.0)
Hemoglobin: 11.6 g/dL — ABNORMAL LOW (ref 13.0–17.0)
Immature Granulocytes: 1 %
Lymphocytes Relative: 12 %
Lymphs Abs: 0.7 10*3/uL (ref 0.7–4.0)
MCH: 35.2 pg — ABNORMAL HIGH (ref 26.0–34.0)
MCHC: 36.5 g/dL — ABNORMAL HIGH (ref 30.0–36.0)
MCV: 96.4 fL (ref 80.0–100.0)
Monocytes Absolute: 0.7 10*3/uL (ref 0.1–1.0)
Monocytes Relative: 13 %
Neutro Abs: 4.1 10*3/uL (ref 1.7–7.7)
Neutrophils Relative %: 73 %
Platelets: 205 10*3/uL (ref 150–400)
RBC: 3.3 MIL/uL — ABNORMAL LOW (ref 4.22–5.81)
RDW: 11.4 % — ABNORMAL LOW (ref 11.5–15.5)
WBC: 5.7 10*3/uL (ref 4.0–10.5)
nRBC: 0 % (ref 0.0–0.2)

## 2021-06-16 LAB — COMPREHENSIVE METABOLIC PANEL
ALT: 40 U/L (ref 0–44)
AST: 39 U/L (ref 15–41)
Albumin: 3 g/dL — ABNORMAL LOW (ref 3.5–5.0)
Alkaline Phosphatase: 65 U/L (ref 38–126)
Anion gap: 10 (ref 5–15)
BUN: 11 mg/dL (ref 8–23)
CO2: 24 mmol/L (ref 22–32)
Calcium: 8.1 mg/dL — ABNORMAL LOW (ref 8.9–10.3)
Chloride: 94 mmol/L — ABNORMAL LOW (ref 98–111)
Creatinine, Ser: 0.49 mg/dL — ABNORMAL LOW (ref 0.61–1.24)
GFR, Estimated: >60 mL/min (ref >60)
Glucose, Bld: 192 mg/dL — ABNORMAL HIGH (ref 70–99)
Potassium: 3.6 mmol/L (ref 3.5–5.1)
Sodium: 128 mmol/L — ABNORMAL LOW (ref 135–145)
Total Bilirubin: 0.9 mg/dL (ref 0.3–1.2)
Total Protein: 5.4 g/dL — ABNORMAL LOW (ref 6.5–8.1)

## 2021-06-16 LAB — GLUCOSE, CAPILLARY
Glucose-Capillary: 182 mg/dL — ABNORMAL HIGH (ref 70–99)
Glucose-Capillary: 123 mg/dL — ABNORMAL HIGH (ref 70–99)
Glucose-Capillary: 116 mg/dL — ABNORMAL HIGH (ref 70–99)
Glucose-Capillary: 182 mg/dL — ABNORMAL HIGH (ref 70–99)

## 2021-06-16 LAB — SODIUM
Sodium: 128 mmol/L — ABNORMAL LOW (ref 135–145)
Sodium: 129 mmol/L — ABNORMAL LOW (ref 135–145)
Sodium: 130 mmol/L — ABNORMAL LOW (ref 135–145)

## 2021-06-16 LAB — MAGNESIUM: Magnesium: 1.6 mg/dL — ABNORMAL LOW (ref 1.7–2.4)

## 2021-06-16 MED ORDER — DEXTROSE 50 % IV SOLN: 1.0000 | Freq: Once | INTRAVENOUS | Status: DC

## 2021-06-16 MED ORDER — IOHEXOL 300 MG/ML  SOLN
75.0000 mL | Freq: Once | INTRAMUSCULAR | Status: AC | PRN
  Administered 2021-06-16: 75 mL via INTRAVENOUS

## 2021-06-16 MED ORDER — INSULIN ASPART 100 UNIT/ML IV SOLN
5.0000 [IU] | Freq: Once | INTRAVENOUS | Status: DC
  Filled 2021-06-16: qty 0.05

## 2021-06-16 NOTE — Plan of Care (Signed)
?  Problem: Education: ?Goal: Knowledge of General Education information will improve ?Description: Including pain rating scale, medication(s)/side effects and non-pharmacologic comfort measures ?Outcome: Progressing ?  ?Problem: Activity: ?Goal: Risk for activity intolerance will decrease ?Outcome: Progressing ? Pt ambulated with walker around nurses station x2. ?

## 2021-06-16 NOTE — Care Management Important Message (Signed)
Important Message ? ?Patient Details  ?Name: John Perez ?MRN: [id number] ?Date of Birth: 19-Nov-1940 ? ? ?Medicare Important Message Given:  Yes ? ? ? ? ?[name] ?06/16/2021, 10:19 AM ?

## 2021-06-16 NOTE — Progress Notes (Signed)
[facility] [employer] NAME: Nathanyl Andujo    MR#:  [id number]  DATE OF BIRTH:  [birth year]  SUBJECTIVE:  son at bedside earlier according to family patient's mentation is much improved. Alert and oriented to time place and person. Eating a bit better. Ambulated using the walker in the hallways. He is wondering when he can go home.  VITALS:  Blood pressure 126/80, pulse 85, temperature 97.7 F (36.5 C), temperature source Oral, resp. rate 17, height 5\' 9"  (1.753 m), SpO2 99 %.  PHYSICAL EXAMINATION:   GENERAL:  81 y.o.-year-old patient lying in the bed with no acute distress. thin LUNGS: Normal breath sounds bilaterally, no wheezing, rales, rhonchi.  CARDIOVASCULAR: S1, S2 normal. No murmurs, rubs, or gallops.  ABDOMEN: Soft, nontender, nondistended. Bowel sounds present.  EXTREMITIES: No  edema b/l.    NEUROLOGIC: nonfocal  patient is alert and awake SKIN: No obvious rash, lesion, or ulcer.   LABORATORY PANEL:  CBC Recent Labs  Lab 06/16/21 0430  WBC 5.7  HGB 11.6*  HCT 31.8*  PLT 205    Chemistries  Recent Labs  Lab 06/16/21 0430 06/16/21 0844 06/16/21 1257  NA 128*   < > 130*  K 3.6  --   --   CL 94*  --   --   CO2 24  --   --   GLUCOSE 192*  --   --   BUN 11  --   --   CREATININE 0.49*  --   --   CALCIUM 8.1*  --   --   MG 1.6*  --   --   AST 39  --   --   ALT 40  --   --   ALKPHOS 65  --   --   BILITOT 0.9  --   --    < > = values in this interval not displayed.   Cardiac Enzymes No results for input(s): TROPONINI in the last 168 hours. RADIOLOGY:  DG Chest 2 View  Result Date: 06/16/2021 CLINICAL DATA:  Hyponatremia. EXAM: CHEST - 2 VIEW COMPARISON:  None. FINDINGS: Interstitial opacities in the right upper lung. Prominence of the right paratracheal/suprahilar mediastinal contour. No confluent consolidation. No visible pleural effusions or pneumothorax. No evidence of cardiomegaly. No evidence of acute osseous  abnormality. IMPRESSION: 1. Interstitial opacities in the right upper lung, which could represent atypical infection or asymmetric edema. 2. Prominence of the right paratracheal/suprahilar mediastinal contour. This could potentially be vascular, but recommend CT of the chest to exclude adenopathy, mass or aneurysm. These results will be called to the ordering clinician or representative by the Radiologist Assistant, and communication documented in the PACS or [employer]. Electronically Signed   By: Margaretha Sheffield M.D.   On: 06/16/2021 08:09   CT CHEST W CONTRAST  Result Date: 06/16/2021 CLINICAL DATA:  Abnormal radiograph, adenopathy. History diabetes mellitus, hypertension EXAM: CT CHEST WITH CONTRAST TECHNIQUE: Multidetector CT imaging of the chest was performed during intravenous contrast administration. RADIATION DOSE REDUCTION: This exam was performed according to the departmental dose-optimization program which includes automated exposure control, adjustment of the mA and/or kV according to patient size and/or use of iterative reconstruction technique. CONTRAST:  49mL OMNIPAQUE IOHEXOL 300 MG/ML SOLN IV COMPARISON:  Chest radiograph 06/16/2021 FINDINGS: Cardiovascular: Atherosclerotic calcifications aorta and coronary arteries. 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 Heart unremarkable. No pericardial effusion. Mediastinum/Nodes: Esophagus unremarkable. Enlarged inferior LEFT cervical node  13 mm image 9. Enlarged inferior RIGHT cervical node 11 mm image 8. Numerous enlarged mediastinal and hilar nodes consistent with extensive adenopathy. Largest nodes measure 22 mm RIGHT hilum, 18 mm subcarinal, 22 mm RIGHT paratracheal, and 12 mm LEFT para-aortic. Lungs/Pleura: Minimal RIGHT pleural effusion. Minimal dependent atelectasis in lower lobes. RIGHT perihilar mass, macrolobulated, 28 x 22 mm image 62, question neoplasm versus adenopathy. Additional RIGHT lower lobe  mass superior segment 11 x 10 mm image 66. Calcified granuloma RIGHT lower lobe. Interseptal thickening in the upper lobes questionable subpleural nodule LEFT upper lobe 4 mm image 31. No pulmonary infiltrate or pneumothorax. Upper Abdomen: Numerous calcified granulomata within spleen. Visualized upper abdomen otherwise unremarkable. Musculoskeletal: Age-indeterminate compression fracture midthoracic spine at T7. No definite metastatic bone lesions seen. IMPRESSION: Extensive mediastinal, hilar and lower cervical adenopathy. RIGHT perihilar mass 28 x 22 mm question primary neoplasm versus adenopathy. Nonspecific 11 mm RIGHT lower lobe nodule. Differential diagnosis includes lung cancer with extensive adenopathy, metastatic disease, and lymphoma. Old granulomatous disease. Age-indeterminate compression fracture midthoracic spine at T7. Aortic Atherosclerosis (ICD10-I70.0). Electronically Signed   By: Lavonia Dana M.D.   On: 06/16/2021 15:50   EEG adult  Result Date: 06/15/2021 Lora Havens, MD     06/15/2021  1:56 PM Patient Name: Daryll Spisak MRN: [id number] Epilepsy Attending: Lora Havens Referring Physician/Provider: Kerney Elbe, MD Date: 06/15/2021 Duration: 21.06 mins Patient history: 81 year old male with fluctuating altered mental status, MRI brain showed left parietal lesion.  EEG to evaluate for seizure. Level of alertness: Awake AEDs during EEG study: None Technical aspects: This EEG study was done with scalp electrodes positioned according to the 10-20 International system of electrode placement. Electrical activity was acquired at a sampling rate of 500Hz  and reviewed with a high frequency filter of 70Hz  and a low frequency filter of 1Hz . EEG data were recorded continuously and digitally stored. Description: The posterior dominant rhythm consists of 8 Hz activity of moderate voltage (25-35 uV) seen predominantly in posterior head regions, symmetric and reactive to eye opening and eye closing.  Physiologic photic driving was not seen during photic stimulation.  Hyperventilation was not performed.   IMPRESSION: This study is within normal limits. No seizures or epileptiform discharges were seen throughout the recording. Priyanka Barbra Sarks    Assessment and Plan Mr. Zawislak is an 81 yo male with PMH DM II, hard of hearing, HTN, HLD who presented to the ER today with worsening confusion at home. He was also seen in the ER on 06/09/2021 after a fall at home when walking down the steps.  It was reported that he lost his balance and fell forward. Head, neck, and shoulder imaging were negative for acute fractures.   Hyponatremia severe SIADH suspect due to right peri hilar mass with extensive adenopathy noted on CT chest -- came in with sodium of 112---130 -- mentation improving -- patient had received 3% saline -- appreciate nephrology input with Dr. Candiss Norse -- TSH normal -- Dr. Grayland Ormond will see patient tomorrow  abnormal LFTs -- stable  abnormal brain MRI --MRI brain on 2/28 shows ~3.5 cm hyperintense abnormal T2 weighted signal concerning for low-grade neoplasm -Discussed with neurology and he underwent repeat MRI brain with and without contrast on 06/13/2021.  Results showed likely an underlying benign process with repeat MRI brain recommended in 3 months per neurology - appreciate neurology evaluation  -- EEG negative  type II diabetes insulin-dependent -- A1c 6.6% -- will continue sliding scale and metformin -- patient  will need to follow-up with endocrinology as outpatient  generalized weakness-- no PT follow-up  discuss results of CT scan with patient's daughter Almyra Free asked seller on the phone.  Procedures: Family communication : Gregary Signs daughter Consults : nephrology, oncology CODE STATUS: full DVT Prophylaxis : enoxaparin Level of care: Telemetry Medical Status is: Inpatient Remains inpatient appropriate because: hyponatremia oncology consult with abnormal CT  scan  anticipate discharge tomorrow    TOTAL TIME TAKING CARE OF THIS PATIENT: 25 minutes.  >50% time spent on counselling and coordination of care  Note: This dictation was prepared with Dragon dictation along with smaller phrase technology. Any transcriptional errors that result from this process are unintentional.  Fritzi Mandes M.D    [facility]   CC: Primary care physician; Derinda Late, MD

## 2021-06-16 NOTE — Progress Notes (Signed)
PT Cancellation Note ? ?Patient Details ?Name: John Perez ?MRN: [id number] ?DOB: 1941/03/01 ? ? ?Cancelled Treatment:    Reason Eval/Treat Not Completed: Other (comment). Pt seated at bedside with family in room. Pt reports he just finished ambulating multiple laps in hallway and using mod I using RW. Pt declines additional therapy treatment at this time. No further needs. Encouraged to maintain mobility during hospital stay. Will dc in house. ? ? ?Cruz Bong ?06/16/2021, 9:27 AM ?Greggory Stallion, PT, DPT, GCS ?[phone] ? ?

## 2021-06-16 NOTE — Progress Notes (Addendum)
[facility]  ROUNDING NOTE   Subjective:   Patient seen standing at bedside, son in room stating they just completed a walk in the hallway Denies shortness of breath  Tolerating small meals States he's staying within his fluid restriction  Sodium 129 Urine output 3.1 L in 24 hours  Objective:  Vital signs in last 24 hours:  Temp:  [97.7 F (36.5 C)-98.3 F (36.8 C)] 97.7 F (36.5 C) (03/07 0802) Pulse Rate:  [68-85] 85 (03/07 0802) Resp:  [14-18] 17 (03/07 0802) BP: (126-174)/(63-91) 126/80 (03/07 0802) SpO2:  [97 %-99 %] 99 % (03/07 0802)  Weight change:  There were no vitals filed for this visit.  Intake/Output: I/O last 3 completed shifts: In: 120 [P.O.:120] Out: 4175 [Urine:4175]   Intake/Output this shift:  Total I/O In: 240 [P.O.:240] Out: 700 [Urine:700]  Physical Exam: General: NAD, pleasant  Head: Normocephalic, atraumatic. Moist oral mucosal membranes  Eyes: Anicteric  Lungs:  Clear to auscultation, normal effort, room air  Heart: Regular rate and rhythm  Abdomen:  Soft, nontender  Extremities: No peripheral edema.  Neurologic: Nonfocal, moving all four extremities  Skin: No lesions       Basic Metabolic Panel: Recent Labs  Lab 06/12/21 0519 06/12/21 1215 06/13/21 0639 06/13/21 1301 06/13/21 1800 06/14/21 0656 06/14/21 6010 06/15/21 0457 06/15/21 0825 06/15/21 1614 06/15/21 2013 06/15/21 2347 06/16/21 0430 06/16/21 0844  NA 124*   < > 126* 124*   < > 128*   < > 127*   < > 127* 127* 128* 128* 129*  K 3.6  --  3.7 3.3*  --  3.9  --  3.4*  --   --   --   --  3.6  --   CL 91*  --  93* 92*  --  90*  --  93*  --   --   --   --  94*  --   CO2 25  --  26 25  --  25  --  24  --   --   --   --  24  --   GLUCOSE 188*  --  203* 111*  --  172*  --  176*  --   --   --   --  192*  --   BUN 9  --  9 10  --  8  --  11  --   --   --   --  11  --   CREATININE 0.50*  --  0.50* 0.44*  --  0.48*  --  0.46*  --   --   --   --  0.49*  --    CALCIUM 8.2*  --  8.0* 8.2*  --  8.1*  --  8.2*  --   --   --   --  8.1*  --   MG 1.7  --  1.7  --   --  1.6*  --  1.6*  --   --   --   --  1.6*  --    < > = values in this interval not displayed.     Liver Function Tests: Recent Labs  Lab 06/12/21 0519 06/13/21 0639 06/14/21 0656 06/15/21 0457 06/16/21 0430  AST 79* 64* 53* 48* 39  ALT 50* 48* 45* 42 40  ALKPHOS 67 67 64 70 65  BILITOT 1.2 1.0 0.9 1.0 0.9  PROT 5.6* 5.6* 5.6* 5.6* 5.4*  ALBUMIN 3.3* 3.3* 3.1* 3.2* 3.0*  No results for input(s): LIPASE, AMYLASE in the last 168 hours. Recent Labs  Lab 06/14/21 1536  AMMONIA 28     CBC: Recent Labs  Lab 06/12/21 0519 06/13/21 0639 06/14/21 0656 06/15/21 0457 06/16/21 0430  WBC 8.1 6.8 5.3 5.7 5.7  NEUTROABS 6.4 5.4 4.0 4.4 4.1  HGB 12.7* 11.9* 11.7* 11.6* 11.6*  HCT 34.1* 33.2* 32.0* 32.8* 31.8*  MCV 94.7 95.7 95.8 96.5 96.4  PLT 215 217 196 209 205     Cardiac Enzymes: No results for input(s): CKTOTAL, CKMB, CKMBINDEX, TROPONINI in the last 168 hours.  BNP: Invalid input(s): POCBNP  CBG: Recent Labs  Lab 06/15/21 1245 06/15/21 1603 06/15/21 2036 06/16/21 0804 06/16/21 1156  GLUCAP 127* 135* 195* 182* 123*     Microbiology: Results for orders placed or performed during the hospital encounter of 06/10/21  Urine Culture     Status: Abnormal   Collection Time: 06/10/21  3:42 PM   Specimen: Urine, Random  Result Value Ref Range Status   Specimen Description   Final    URINE, RANDOM Performed at [facility], [address] [place], [place] [address]    Special Requests   Final    Normal Performed at [facility], [address] [place], [place] [address]    Culture >=100,000 COLONIES/mL SERRATIA MARCESCENS (A)  Final   Report Status 06/13/2021 FINAL  Final   Organism ID, Bacteria SERRATIA MARCESCENS (A)  Final      Susceptibility   Serratia marcescens - MIC*    CEFAZOLIN >=64 RESISTANT Resistant     CEFEPIME  <=0.12 SENSITIVE Sensitive     CEFTRIAXONE <=0.25 SENSITIVE Sensitive     CIPROFLOXACIN <=0.25 SENSITIVE Sensitive     GENTAMICIN <=1 SENSITIVE Sensitive     NITROFURANTOIN 256 RESISTANT Resistant     TRIMETH/SULFA <=20 SENSITIVE Sensitive     * >=100,000 COLONIES/mL SERRATIA MARCESCENS  Resp Panel by RT-PCR (Flu A&B, Covid) Nasopharyngeal Swab     Status: None   Collection Time: 06/10/21  4:41 PM   Specimen: Nasopharyngeal Swab; Nasopharyngeal(NP) swabs in vial transport medium  Result Value Ref Range Status   SARS Coronavirus 2 by RT PCR NEGATIVE NEGATIVE Final    Comment: (NOTE) SARS-CoV-2 target nucleic acids are NOT DETECTED.  The SARS-CoV-2 RNA is generally detectable in upper respiratory specimens during the acute phase of infection. The lowest concentration of SARS-CoV-2 viral copies this assay can detect is 138 copies/mL. A negative result does not preclude SARS-Cov-2 infection and should not be used as the sole basis for treatment or other patient management decisions. A negative result may occur with  improper specimen collection/handling, submission of specimen other than nasopharyngeal swab, presence of viral mutation(s) within the areas targeted by this assay, and inadequate number of viral copies(<138 copies/mL). A negative result must be combined with clinical observations, patient history, and epidemiological information. The expected result is Negative.  Fact Sheet for Patients:  EntrepreneurPulse.com.au  Fact Sheet for Healthcare Providers:  IncredibleEmployment.be  This test is no t yet approved or cleared by the [place] FDA and  has been authorized for detection and/or diagnosis of SARS-CoV-2 by FDA under an Emergency Use Authorization (EUA). This EUA will remain  in effect (meaning this test can be used) for the duration of the COVID-19 declaration under Section 564(b)(1) of the Act, 21 U.S.C.section 360bbb-3(b)(1),  unless the authorization is terminated  or revoked sooner.       Influenza A by PCR NEGATIVE NEGATIVE  Final   Influenza B by PCR NEGATIVE NEGATIVE Final    Comment: (NOTE) The Xpert Xpress SARS-CoV-2/FLU/RSV plus assay is intended as an aid in the diagnosis of influenza from Nasopharyngeal swab specimens and should not be used as a sole basis for treatment. Nasal washings and aspirates are unacceptable for Xpert Xpress SARS-CoV-2/FLU/RSV testing.  Fact Sheet for Patients: EntrepreneurPulse.com.au  Fact Sheet for Healthcare Providers: IncredibleEmployment.be  This test is not yet approved or cleared by the [place] FDA and has been authorized for detection and/or diagnosis of SARS-CoV-2 by FDA under an Emergency Use Authorization (EUA). This EUA will remain in effect (meaning this test can be used) for the duration of the COVID-19 declaration under Section 564(b)(1) of the Act, 21 U.S.C. section 360bbb-3(b)(1), unless the authorization is terminated or revoked.  Performed at [facility], [address] [place], [place] [address]     Coagulation Studies: No results for input(s): LABPROT, INR in the last 72 hours.  Urinalysis: No results for input(s): COLORURINE, LABSPEC, PHURINE, GLUCOSEU, HGBUR, BILIRUBINUR, KETONESUR, PROTEINUR, UROBILINOGEN, NITRITE, LEUKOCYTESUR in the last 72 hours.  Invalid input(s): APPERANCEUR    Imaging: DG Chest 2 View  Result Date: 06/16/2021 CLINICAL DATA:  Hyponatremia. EXAM: CHEST - 2 VIEW COMPARISON:  None. FINDINGS: Interstitial opacities in the right upper lung. Prominence of the right paratracheal/suprahilar mediastinal contour. No confluent consolidation. No visible pleural effusions or pneumothorax. No evidence of cardiomegaly. No evidence of acute osseous abnormality. IMPRESSION: 1. Interstitial opacities in the right upper lung, which could represent atypical infection or asymmetric  edema. 2. Prominence of the right paratracheal/suprahilar mediastinal contour. This could potentially be vascular, but recommend CT of the chest to exclude adenopathy, mass or aneurysm. These results will be called to the ordering clinician or representative by the Radiologist Assistant, and communication documented in the PACS or [employer]. Electronically Signed   By: Margaretha Sheffield M.D.   On: 06/16/2021 08:09   EEG adult  Result Date: 06/15/2021 Lora Havens, MD     06/15/2021  1:56 PM Patient Name: John Perez MRN: [id number] Epilepsy Attending: Lora Havens Referring Physician/Provider: Kerney Elbe, MD Date: 06/15/2021 Duration: 21.06 mins Patient history: 81 year old male with fluctuating altered mental status, MRI brain showed left parietal lesion.  EEG to evaluate for seizure. Level of alertness: Awake AEDs during EEG study: None Technical aspects: This EEG study was done with scalp electrodes positioned according to the 10-20 International system of electrode placement. Electrical activity was acquired at a sampling rate of 500Hz  and reviewed with a high frequency filter of 70Hz  and a low frequency filter of 1Hz . EEG data were recorded continuously and digitally stored. Description: The posterior dominant rhythm consists of 8 Hz activity of moderate voltage (25-35 uV) seen predominantly in posterior head regions, symmetric and reactive to eye opening and eye closing. Physiologic photic driving was not seen during photic stimulation.  Hyperventilation was not performed.   IMPRESSION: This study is within normal limits. No seizures or epileptiform discharges were seen throughout the recording. Priyanka Barbra Sarks     Medications:     aspirin  81 mg Oral Daily   benazepril  20 mg Oral Daily   enoxaparin (LOVENOX) injection  40 mg Subcutaneous Daily   finasteride  5 mg Oral Daily   furosemide  10 mg Oral Daily   insulin aspart  0-5 Units Subcutaneous QHS   insulin aspart  0-9 Units  Subcutaneous TID WC   melatonin  5 mg Oral  QHS   metFORMIN  1,000 mg Oral BID WC   PARoxetine  10 mg Oral Daily   sodium chloride  2 g Oral BID WC   tamsulosin  0.4 mg Oral Daily   acetaminophen **OR** acetaminophen, sodium chloride, traZODone  Assessment/ Plan:  Mr. John Perez is a 81 y.o.  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  Hypovolemic on admission Patient has history of chronic hyponatremia Patient has had sodium low going back to 2021  Sodium has improved to 129, with a goal 130.  Encourage oral intake and maintain fluid restriction.  Reduced IV furosemide 40 mg to daily.  We will continue to monitor  2.  Hypertension.  Currently receiving benazepril, tamsulosin and furosemide.  BP improved to 126/80  3.  Diabetes mellitus type 2, unspecified.  Home regimen includes Lantus, NovoLog, and metformin.  Primary team to manage sliding scale insulin.  Glucose stable    LOS: 6   3/7/202312:51 PM

## 2021-06-17 ENCOUNTER — Encounter: Payer: Self-pay | Admitting: Internal Medicine

## 2021-06-17 ENCOUNTER — Telehealth: Payer: Self-pay | Admitting: *Deleted

## 2021-06-17 ENCOUNTER — Other Ambulatory Visit: Payer: Self-pay | Admitting: Emergency Medicine

## 2021-06-17 DIAGNOSIS — R222 Localized swelling, mass and lump, trunk: Secondary | ICD-10-CM

## 2021-06-17 DIAGNOSIS — E871 Hypo-osmolality and hyponatremia: Secondary | ICD-10-CM | POA: Diagnosis not present

## 2021-06-17 LAB — GLUCOSE, CAPILLARY
Glucose-Capillary: 186 mg/dL — ABNORMAL HIGH (ref 70–99)
Glucose-Capillary: 168 mg/dL — ABNORMAL HIGH (ref 70–99)

## 2021-06-17 LAB — VITAMIN B1: Vitamin B1 (Thiamine): 89.5 nmol/L (ref 66.5–200.0)

## 2021-06-17 LAB — MAGNESIUM: Magnesium: 1.6 mg/dL — ABNORMAL LOW (ref 1.7–2.4)

## 2021-06-17 LAB — SODIUM: Sodium: 131 mmol/L — ABNORMAL LOW (ref 135–145)

## 2021-06-17 MED ORDER — MAGNESIUM OXIDE -MG SUPPLEMENT 400 (240 MG) MG PO TABS
400.0000 mg | ORAL_TABLET | Freq: Two times a day (BID) | ORAL | Status: DC
  Administered 2021-06-17: 400 mg via ORAL
  Filled 2021-06-17: qty 1

## 2021-06-17 MED ORDER — SODIUM CHLORIDE 1 G PO TABS: 2.0000 g | ORAL_TABLET | Freq: Two times a day (BID) | ORAL | 0 refills | Status: DC

## 2021-06-17 MED ORDER — FUROSEMIDE 20 MG PO TABS: 10.0000 mg | ORAL_TABLET | Freq: Every day | ORAL | 0 refills | Status: DC

## 2021-06-17 MED ORDER — METFORMIN HCL 1000 MG PO TABS: 1000.0000 mg | ORAL_TABLET | Freq: Two times a day (BID) | ORAL | 1 refills | Status: AC

## 2021-06-17 MED ORDER — MAGNESIUM OXIDE -MG SUPPLEMENT 400 (240 MG) MG PO TABS
400.0000 mg | ORAL_TABLET | Freq: Two times a day (BID) | ORAL | 1 refills | Status: DC
Start: 2021-06-17 — End: 2021-08-18

## 2021-06-17 NOTE — Telephone Encounter (Signed)
errror

## 2021-06-17 NOTE — Consult Note (Signed)
John Perez  [phone] [phone]  ID: Talmadge Coventry OB: 12-14-1940  MR#: [id number]  UYQ#:[id number]  Patient Care Team: Derinda Late, MD as PCP - General (Family Medicine)  CHIEF COMPLAINT: Right perihilar mass with lymphadenopathy, hyponatremia  INTERVAL HISTORY: Patient is an 81 year old male who was recently admitted to the hospital with increasing weakness and fatigue and confusion.  He was found to have significant hyponatremia.  CT scan chest also revealed a right perihilar mass with mediastinal and hilar lymphadenopathy highly suspicious for underlying malignancy.  Patient continues to complain of weakness and fatigue, but otherwise feels well.  He has no neurologic complaints.  He denies any recent fevers.  He has a fair appetite, but denies weight loss.  He has no chest pain, shortness of breath, cough, or hemoptysis.  He denies any nausea, vomiting, constipation, or diarrhea.  He has no urinary complaints.  Patient offers no further specific complaints today.  REVIEW OF SYSTEMS:   Review of Systems  Constitutional:  Positive for malaise/fatigue. Negative for fever and weight loss.  Respiratory: Negative.  Negative for cough, hemoptysis and shortness of breath.   Cardiovascular: Negative.  Negative for chest pain and leg swelling.  Gastrointestinal: Negative.  Negative for abdominal pain.  Genitourinary: Negative.  Negative for dysuria.  Musculoskeletal: Negative.  Negative for back pain.  Skin: Negative.  Negative for rash.  Neurological:  Positive for weakness. Negative for dizziness, focal weakness and headaches.  Psychiatric/Behavioral: Negative.  The patient is not nervous/anxious.    As per HPI. Otherwise, a complete review of systems is negative.  PAST MEDICAL HISTORY: Past Medical History:  Diagnosis Date   Diabetes mellitus type 2, insulin dependent ([place])    HOH (hard of hearing)    Hypercholesteremia    Hypertension      PAST SURGICAL HISTORY: Past Surgical History:  Procedure Laterality Date   CATARACT EXTRACTION W/PHACO Left 02/25/2020   Procedure: CATARACT EXTRACTION PHACO AND INTRAOCULAR LENS PLACEMENT ([facility]) LEFT DIABETIC;  Surgeon: Eulogio Bear, MD;  Location: [facility];  Service: Ophthalmology;  Laterality: Left;  4.98 0:39.4   CATARACT EXTRACTION W/PHACO Right 03/17/2020   Procedure: CATARACT EXTRACTION PHACO AND INTRAOCULAR LENS PLACEMENT ([place]) RIGHT DIABETIC;  Surgeon: Eulogio Bear, MD;  Location: [facility];  Service: Ophthalmology;  Laterality: Right;  6.33 0:55.3   GUM SURGERY     HERNIA REPAIR     x2    FAMILY HISTORY: History reviewed. No pertinent family history.  ADVANCED DIRECTIVES (Y/N):  @ADVDIR @  HEALTH MAINTENANCE: Social History   Tobacco Use   Smoking status: Former    Types: Cigarettes    Quit date: 1990    Years since quitting: 33.2   Smokeless tobacco: Never  Vaping Use   Vaping Use: Never used  Substance Use Topics   Alcohol use: Not Currently     Colonoscopy:  PAP:  Bone density:  Lipid panel:  No Known Allergies  Current Facility-Administered Medications  Medication Dose Route Frequency Provider Last Rate Last Admin   acetaminophen (TYLENOL) tablet 650 mg  650 mg Oral Q6H PRN Dwyane Dee, MD   650 mg at 06/13/21 1949   Or   acetaminophen (TYLENOL) suppository 650 mg  650 mg Rectal Q6H PRN Dwyane Dee, MD       aspirin chewable tablet 81 mg  81 mg Oral Daily Dwyane Dee, MD   81 mg at 06/17/21 0943   benazepril (LOTENSIN) tablet 20 mg  20 mg Oral  Daily Dwyane Dee, MD   20 mg at 06/17/21 1029   enoxaparin (LOVENOX) injection 40 mg  40 mg Subcutaneous Daily Dwyane Dee, MD   40 mg at 06/17/21 0945   finasteride (PROSCAR) tablet 5 mg  5 mg Oral Daily Dwyane Dee, MD   5 mg at 06/17/21 9798   furosemide (LASIX) tablet 10 mg  10 mg Oral Daily Murlean Iba, MD   10 mg at 06/17/21 0944   insulin aspart  (novoLOG) injection 0-5 Units  0-5 Units Subcutaneous QHS Dwyane Dee, MD       insulin aspart (novoLOG) injection 0-9 Units  0-9 Units Subcutaneous TID WC Dwyane Dee, MD   2 Units at 06/17/21 1317   magnesium oxide (MAG-OX) tablet 400 mg  400 mg Oral BID Fritzi Mandes, MD   400 mg at 06/17/21 0944   melatonin tablet 5 mg  5 mg Oral Standley Brooking, MD   5 mg at 06/16/21 2109   metFORMIN (GLUCOPHAGE) tablet 1,000 mg  1,000 mg Oral BID WC Dwyane Dee, MD   1,000 mg at 06/17/21 1029   PARoxetine (PAXIL) tablet 10 mg  10 mg Oral Daily Dwyane Dee, MD   10 mg at 06/17/21 1029   sodium chloride (OCEAN) 0.65 % nasal spray 1 spray  1 spray Each Nare PRN Dwyane Dee, MD       sodium chloride tablet 2 g  2 g Oral BID WC Bhutani, Manpreet S, MD   2 g at 06/17/21 0943   tamsulosin (FLOMAX) capsule 0.4 mg  0.4 mg Oral Daily Dwyane Dee, MD   0.4 mg at 06/17/21 9211   traZODone (DESYREL) tablet 50 mg  50 mg Oral QHS PRN Dwyane Dee, MD   50 mg at 06/16/21 2257   Current Outpatient Medications  Medication Sig Dispense Refill   ASPIRIN 81 PO Take by mouth daily.     benazepril (LOTENSIN) 20 MG tablet Take 20 mg by mouth daily.     finasteride (PROSCAR) 5 MG tablet Take 5 mg by mouth daily.     MELATONIN PO Take by mouth at bedtime as needed.     Omega-3 Fatty Acids (FISH OIL PO) Take by mouth daily.     PARoxetine (PAXIL) 10 MG tablet Take 10 mg by mouth daily.     pravastatin (PRAVACHOL) 20 MG tablet Take 20 mg by mouth daily.     tamsulosin (FLOMAX) 0.4 MG CAPS capsule Take 0.4 mg by mouth.     [START ON 06/18/2021] furosemide (LASIX) 20 MG tablet Take 0.5 tablets (10 mg total) by mouth daily. 30 tablet 0   magnesium oxide (MAG-OX) 400 (240 Mg) MG tablet Take 1 tablet (400 mg total) by mouth 2 (two) times daily. 60 tablet 1   metFORMIN (GLUCOPHAGE) 1000 MG tablet Take 1 tablet (1,000 mg total) by mouth 2 (two) times daily with a meal. 60 tablet 1   Multiple Vitamin (MULTIVITAMIN PO)  Take by mouth daily.     sodium chloride 1 g tablet Take 2 tablets (2 g total) by mouth 2 (two) times daily with a meal. 60 tablet 0    OBJECTIVE: Vitals:   06/17/21 0410 06/17/21 0736  BP: (!) 148/77 (!) 165/82  Pulse: 72 78  Resp: 18 16  Temp: 98 F (36.7 C) 98.2 F (36.8 C)  SpO2: 98% 98%     Body mass index is 19.94 kg/m.    ECOG FS:1 - Symptomatic but completely ambulatory  General: Well-developed, well-nourished,  no acute distress. Eyes: Pink conjunctiva, anicteric sclera. HEENT: Normocephalic, moist mucous membranes. Lungs: No audible wheezing or coughing. Heart: Regular rate and rhythm. Abdomen: Soft, nontender, no obvious distention. Musculoskeletal: No edema, cyanosis, or clubbing. Neuro: Alert, answering all questions appropriately. Cranial nerves grossly intact. Skin: No rashes or petechiae noted. Psych: Normal affect. Lymphatics: No cervical, calvicular, axillary or inguinal LAD.   LAB RESULTS:  Lab Results  Component Value Date   NA 131 (L) 06/17/2021   K 3.6 06/16/2021   CL 94 (L) 06/16/2021   CO2 24 06/16/2021   GLUCOSE 192 (H) 06/16/2021   BUN 11 06/16/2021   CREATININE 0.49 (L) 06/16/2021   CALCIUM 8.1 (L) 06/16/2021   PROT 5.4 (L) 06/16/2021   ALBUMIN 3.0 (L) 06/16/2021   AST 39 06/16/2021   ALT 40 06/16/2021   ALKPHOS 65 06/16/2021   BILITOT 0.9 06/16/2021   GFRNONAA >60 06/16/2021    Lab Results  Component Value Date   WBC 5.7 06/16/2021   NEUTROABS 4.1 06/16/2021   HGB 11.6 (L) 06/16/2021   HCT 31.8 (L) 06/16/2021   MCV 96.4 06/16/2021   PLT 205 06/16/2021     STUDIES: DG Chest 2 View  Result Date: 06/16/2021 CLINICAL DATA:  Hyponatremia. EXAM: CHEST - 2 VIEW COMPARISON:  None. FINDINGS: Interstitial opacities in the right upper lung. Prominence of the right paratracheal/suprahilar mediastinal contour. No confluent consolidation. No visible pleural effusions or pneumothorax. No evidence of cardiomegaly. No evidence of acute osseous  abnormality. IMPRESSION: 1. Interstitial opacities in the right upper lung, which could represent atypical infection or asymmetric edema. 2. Prominence of the right paratracheal/suprahilar mediastinal contour. This could potentially be vascular, but recommend CT of the chest to exclude adenopathy, mass or aneurysm. These results will be called to the ordering clinician or representative by the Radiologist Assistant, and communication documented in the PACS or [employer]. Electronically Signed   By: Margaretha Sheffield M.D.   On: 06/16/2021 08:09   DG Shoulder Right  Result Date: 06/09/2021 CLINICAL DATA:  Fall, right shoulder pain EXAM: RIGHT SHOULDER - 2+ VIEW COMPARISON:  None. FINDINGS: Expected glenohumeral alignment without visible fracture. Mild anterior glenoid spurring. Both frontal projections of the humerus have the humerus somewhat internally rotated. Atherosclerotic calcification of the aortic arch. Mild prominence of the right border of the ascending thoracic aorta is nonspecific, although can sometimes be associated with ascending thoracic aortic aneurysm. IMPRESSION: 1. No fracture, dislocation, or acute bony findings. 2.  Aortic Atherosclerosis (ICD10-I70.0). 3. Mild prominence of the right contour of the mediastinum is nonspecific, but ascending thoracic aortic aneurysm cannot be readily excluded. Electronically Signed   By: Van Clines M.D.   On: 06/09/2021 18:42   CT Head Wo Contrast  Result Date: 06/10/2021 CLINICAL DATA:  yesterday for a fall, family reports pt fell again this morning around [place] states since the pt has had increased confusion, not making any since, "babbling EXAM: CT HEAD WITHOUT CONTRAST TECHNIQUE: Contiguous axial images were obtained from the base of the skull through the vertex without intravenous contrast. RADIATION DOSE REDUCTION: This exam was performed according to the departmental dose-optimization program which includes automated exposure control,  adjustment of the mA and/or kV according to patient size and/or use of iterative reconstruction technique. COMPARISON:  CT head 06/09/2021, MRI head 06/09/2021 BRAIN: BRAIN Cerebral ventricle sizes are concordant with the degree of cerebral volume loss. Similar-appearing left parietal lobe hypodensity along the white matter that is better evaluated on MRI head 06/09/2021. Patchy  and confluent areas of decreased attenuation are noted throughout the deep and periventricular white matter of the cerebral hemispheres bilaterally, compatible with chronic microvascular ischemic disease. No evidence of large-territorial acute infarction. No parenchymal hemorrhage. No mass lesion. No extra-axial collection. No mass effect or midline shift. No hydrocephalus. Basilar cisterns are patent. Vascular: No hyperdense vessel. Atherosclerotic calcifications are present within the cavernous internal carotid arteries. Skull: No acute fracture or focal lesion. Sinuses/Orbits: Paranasal sinuses and mastoid air cells are clear. Bilateral lens replacement. Otherwise the orbits are unremarkable. Other: None. IMPRESSION: 1. No acute intracranial abnormality. 2. Similar-appearing left parietal lobe hypodensity along the white matter that is better evaluated on MRI head 06/09/2021. Per MRI, finding concerning for a low-grade neoplasm. " Consider follow-up brain MRI in 6-12 weeks." Electronically Signed   By: Iven Finn M.D.   On: 06/10/2021 15:26   CT Head Wo Contrast  Result Date: 06/09/2021 CLINICAL DATA:  Trauma EXAM: CT HEAD WITHOUT CONTRAST TECHNIQUE: Contiguous axial images were obtained from the base of the skull through the vertex without intravenous contrast. RADIATION DOSE REDUCTION: This exam was performed according to the departmental dose-optimization program which includes automated exposure control, adjustment of the mA and/or kV according to patient size and/or use of iterative reconstruction technique. COMPARISON:   None. FINDINGS: Brain: There are no signs of bleeding within the cranium. There is 3.5 cm area of low-density in the left parietal lobe in the subcortical/periventricular location. There is no significant focal mass effect. Cortical sulci are prominent. Vascular: Unremarkable. Skull: Unremarkable Sinuses/Orbits: There is mild mucosal thickening in the ethmoid sinus. Mucous retention cyst is seen in the right maxillary sinus. Other: None IMPRESSION: There are no signs of bleeding or focal mass effect in the noncontrast images of brain. There is 3.5 cm focus of low-density in the left parietal lobe in the subcortical and periventricular location. This may suggest recent or subacute or chronic infarct in the left MCA distribution. Follow-up MRI as clinically warranted should be considered. Atrophy.  Small-vessel disease. Chronic sinusitis. Electronically Signed   By: Elmer Picker M.D.   On: 06/09/2021 18:20   CT CHEST W CONTRAST  Result Date: 06/16/2021 CLINICAL DATA:  Abnormal radiograph, adenopathy. History diabetes mellitus, hypertension EXAM: CT CHEST WITH CONTRAST TECHNIQUE: Multidetector CT imaging of the chest was performed during intravenous contrast administration. RADIATION DOSE REDUCTION: This exam was performed according to the departmental dose-optimization program which includes automated exposure control, adjustment of the mA and/or kV according to patient size and/or use of iterative reconstruction technique. CONTRAST:  1mL OMNIPAQUE IOHEXOL 300 MG/ML SOLN IV COMPARISON:  Chest radiograph 06/16/2021 FINDINGS: Cardiovascular: Atherosclerotic calcifications aorta and coronary arteries. 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 Heart unremarkable. No pericardial effusion. Mediastinum/Nodes: Esophagus unremarkable. Enlarged inferior LEFT cervical node 13 mm image 9. Enlarged inferior RIGHT cervical node 11 mm image 8. Numerous enlarged mediastinal and hilar nodes  consistent with extensive adenopathy. Largest nodes measure 22 mm RIGHT hilum, 18 mm subcarinal, 22 mm RIGHT paratracheal, and 12 mm LEFT para-aortic. Lungs/Pleura: Minimal RIGHT pleural effusion. Minimal dependent atelectasis in lower lobes. RIGHT perihilar mass, macrolobulated, 28 x 22 mm image 62, question neoplasm versus adenopathy. Additional RIGHT lower lobe mass superior segment 11 x 10 mm image 66. Calcified granuloma RIGHT lower lobe. Interseptal thickening in the upper lobes questionable subpleural nodule LEFT upper lobe 4 mm image 31. No pulmonary infiltrate or pneumothorax. Upper Abdomen: Numerous calcified granulomata within spleen. Visualized upper abdomen otherwise unremarkable. Musculoskeletal:  Age-indeterminate compression fracture midthoracic spine at T7. No definite metastatic bone lesions seen. IMPRESSION: Extensive mediastinal, hilar and lower cervical adenopathy. RIGHT perihilar mass 28 x 22 mm question primary neoplasm versus adenopathy. Nonspecific 11 mm RIGHT lower lobe nodule. Differential diagnosis includes lung cancer with extensive adenopathy, metastatic disease, and lymphoma. Old granulomatous disease. Age-indeterminate compression fracture midthoracic spine at T7. Aortic Atherosclerosis (ICD10-I70.0). Electronically Signed   By: Lavonia Dana M.D.   On: 06/16/2021 15:50   CT Cervical Spine Wo Contrast  Result Date: 06/09/2021 CLINICAL DATA:  Facial trauma, fell downstairs EXAM: CT CERVICAL SPINE WITHOUT CONTRAST TECHNIQUE: Multidetector CT imaging of the cervical spine was performed without intravenous contrast. Multiplanar CT image reconstructions were also generated. RADIATION DOSE REDUCTION: This exam was performed according to the departmental dose-optimization program which includes automated exposure control, adjustment of the mA and/or kV according to patient size and/or use of iterative reconstruction technique. COMPARISON:  None. FINDINGS: Alignment: Alignment is grossly  anatomic. Skull base and vertebrae: No acute fracture. No primary bone lesion or focal pathologic process. Soft tissues and spinal canal: No prevertebral fluid or swelling. No visible canal hematoma. Disc levels: Multilevel spondylosis greatest at C3-4, C5-6, and C6-7. There is mild diffuse facet hypertrophy, left predominant. Upper chest: Airway is patent.  Lung apices are clear. Other: Reconstructed images demonstrate no additional findings. IMPRESSION: 1. No acute cervical spine fracture. 2. Multilevel spondylosis and facet hypertrophy, greatest at C3-4, C5-6, and C6-7. Electronically Signed   By: Randa Ngo M.D.   On: 06/09/2021 18:23   MR BRAIN WO CONTRAST  Result Date: 06/09/2021 CLINICAL DATA:  Stroke follow-up EXAM: MRI HEAD WITHOUT CONTRAST TECHNIQUE: Multiplanar, multiecho pulse sequences of the brain and surrounding structures were obtained without intravenous contrast. COMPARISON:  None. FINDINGS: Brain: No acute infarct, mass effect or extra-axial collection. No acute or chronic hemorrhage. There is an area of abnormal hyperintense T2-weighted signal within the left parietal white matter. No midline shift or other mass effect. The midline structures are normal. Vascular: Major flow voids are preserved. Skull and upper cervical spine: Normal calvarium and skull base. Visualized upper cervical spine and soft tissues are normal. Sinuses/Orbits:Left mastoid effusion.  Normal orbits. IMPRESSION: 1. No acute intracranial abnormality. 2. Area of abnormal hyperintense T2-weighted signal within the left parietal white matter, concerning for low grade neoplasm. Contrast administration may be helpful. Consider follow-up brain MRI in 6-12 weeks. Electronically Signed   By: Ulyses Jarred M.D.   On: 06/09/2021 23:04   MR BRAIN W WO CONTRAST  Result Date: 06/13/2021 CLINICAL DATA:  Brain/CNS neoplasm, staging Neuro deficit, persistent/recurrent, CNS neoplasm suspected further evaluation possible neoplasm L  parietal lobe seen on noncon MRI. EXAM: MRI HEAD WITHOUT AND WITH CONTRAST TECHNIQUE: Multiplanar, multiecho pulse sequences of the brain and surrounding structures were obtained without and with intravenous contrast. CONTRAST:  7.39mL GADAVIST GADOBUTROL 1 MMOL/ML IV SOLN COMPARISON:  Brain MRI 06/09/2021 FINDINGS: Brain: No acute infarct, mass effect or extra-axial collection. No acute or chronic hemorrhage. Hyperintense T2-weighted signal in the left parietal white matter. Generalized volume loss. The midline structures are normal. There is no abnormal contrast enhancement at the site of the left parietal abnormality. Vascular: Major flow voids are preserved. Skull and upper cervical spine: Normal calvarium and skull base. Visualized upper cervical spine and soft tissues are normal. Sinuses/Orbits:Left mastoid effusion.  Normal orbits. IMPRESSION: 1. No abnormal contrast enhancement at the site of the left parietal abnormality. This is favored to be a benign process such  as chronic ischemia. However, a follow-up brain MRI in 6 months is recommended to ensure stability. 2. Generalized volume loss and findings of chronic microvascular disease. 3. Left mastoid effusion. Electronically Signed   By: Ulyses Jarred M.D.   On: 06/13/2021 20:00   EEG adult  Result Date: 06/15/2021 Lora Havens, MD     06/15/2021  1:56 PM Patient Name: Marshall Kampf MRN: [id number] Epilepsy Attending: Lora Havens Referring Physician/Provider: Kerney Elbe, MD Date: 06/15/2021 Duration: 21.06 mins Patient history: 81 year old male with fluctuating altered mental status, MRI brain showed left parietal lesion.  EEG to evaluate for seizure. Level of alertness: Awake AEDs during EEG study: None Technical aspects: This EEG study was done with scalp electrodes positioned according to the 10-20 International system of electrode placement. Electrical activity was acquired at a sampling rate of 500Hz  and reviewed with a high frequency filter  of 70Hz  and a low frequency filter of 1Hz . EEG data were recorded continuously and digitally stored. Description: The posterior dominant rhythm consists of 8 Hz activity of moderate voltage (25-35 uV) seen predominantly in posterior head regions, symmetric and reactive to eye opening and eye closing. Physiologic photic driving was not seen during photic stimulation.  Hyperventilation was not performed.   IMPRESSION: This study is within normal limits. No seizures or epileptiform discharges were seen throughout the recording. Priyanka Barbra Sarks    ASSESSMENT: Right perihilar mass with lymphadenopathy, hyponatremia  PLAN:    1.  Right perihilar mass: Highly suspicious for underlying malignancy.  Patient will require bronchoscopy/EBUS and PET scan after discharge to confirm diagnosis and complete staging work-up.  Will arrange follow-up in the cancer center 1 week after his biopsy to discuss the results and treatment planning if necessary. 2.  Hyponatremia: Possibly related to underlying malignancy.  Improved to 131.  Monitor. 3.  Disposition: Discharge home today with follow-up in the cancer center as above.  Appreciate consult.    Lloyd Huger, MD   06/17/2021 2:37 PM

## 2021-06-17 NOTE — TOC Progression Note (Signed)
Transition of Care (TOC) - Progression Note  ? ? ?Patient Details  ?Name: John Perez ?MRN: [id number] ?Date of Birth: [birth year] ? ?Transition of Care (TOC) CM/SW Contact  ?Sayler Mickiewicz A Tyneka Scafidi, LCSW ?Phone Number: ?06/17/2021, 9:54 AM ? ?Clinical Narrative:   CSW spoke with pt's daughter regarding hospital bed request. Per pt's daughter at the time the request and order was made pt was not doing as well. Pt's daughter is stating the order can be canceled that pt will no longer need hospital bed. Plan dc today.  ? ? ? ?  ?  ? ?Expected Discharge Plan and Services ?  ?  ?  ?  ?  ?                ?DME Arranged: Hospital bed ?DME Agency: [facility] ?Date DME Agency Contacted: 06/14/21 ?Time DME Agency Contacted: [phone] ?Representative spoke with at DME Agency: Delana Meyer ?  ?  ?  ?  ?  ? ? ?Social Determinants of Health (SDOH) Interventions ?  ? ?Readmission Risk Interventions ?No flowsheet data found. ? ?

## 2021-06-17 NOTE — Discharge Summary (Addendum)
Physician Discharge Summary   Patient: John Perez MRN: [id number] DOB: November 26, 1940  Admit date:     06/10/2021  Discharge date: 06/17/21  Discharge Physician: Fritzi Mandes   PCP: Derinda Late, MD   Recommendations at discharge:   follow-up with Dr. Grayland Ormond on the appointment schedule through the cancer center follow-up PCP in 1 to 2 weeks  Discharge Diagnoses:  Severe hyponatremia due to SID eight suspect right Perihilar mass Right Perihilar mass workup as outpatient   Hospital Course:  John Perez is an 81 yo male with PMH DM II, hard of hearing, HTN, HLD who presented to the ER today with worsening confusion at home. He was also seen in the ER on 06/09/2021 after a fall at home when walking down the steps.  It was reported that he lost his balance and fell forward. Head, neck, and shoulder imaging were negative for acute fractures.     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 Dois Davenport lasix and salt tabs -- TSH normal -- Dr. Grayland Ormond will see patienttoday   abnormal LFTs -- stable   abnormal brain MRI --MRI brain on 2/28 shows ~3.5 cm hyperintense abnormal T2 weighted signal concerning for low-grade neoplasm -Discussed with neurology and he underwent repeat MRI brain with and without contrast on 06/13/2021.  Results showed likely an underlying benign process with repeat MRI brain recommended in 3 months per neurology - appreciate neurology evaluation  -- EEG negative   type II diabetes insulin-dependent -- A1c 6.6% -- will continue sliding scale and metformin -- patient will need to follow-up with endocrinology as outpatient   generalized weakness-- no PT follow-up   discuss results of CT scan with patient's daughter Almyra Free  seller on the phone. D/c plans d/w son and  pt at bedside        Consultants: nephrology, oncology Procedures performed: none Disposition: Home Diet recommendation:  Discharge Diet Orders (From admission, onward)     Start     Ordered   06/17/21 0000  Diet - low sodium heart healthy        06/17/21 1015           Cardiac diet DISCHARGE MEDICATION: Allergies as of 06/17/2021   No Known Allergies      Medication List     STOP taking these medications    insulin glargine 100 UNIT/ML injection Commonly known as: LANTUS   metFORMIN 500 MG (MOD) 24 hr tablet Commonly known as: GLUMETZA Replaced by: metFORMIN 1000 MG tablet   NovoLOG FlexPen 100 UNIT/ML FlexPen Generic drug: insulin aspart   traMADol 50 MG tablet Commonly known as: Ultram       TAKE these medications    ASPIRIN 81 PO Take by mouth daily.   benazepril 20 MG tablet Commonly known as: LOTENSIN Take 20 mg by mouth daily.   finasteride 5 MG tablet Commonly known as: PROSCAR Take 5 mg by mouth daily.   FISH OIL PO Take by mouth daily.   furosemide 20 MG tablet Commonly known as: LASIX Take 0.5 tablets (10 mg total) by mouth daily. Start taking on: June 18, 2021   magnesium oxide 400 (240 Mg) MG tablet Commonly known as: MAG-OX Take 1 tablet (400 mg total) by mouth 2 (two) times daily.   MELATONIN PO Take by mouth at bedtime as  needed.   metFORMIN 1000 MG tablet Commonly known as: GLUCOPHAGE Take 1 tablet (1,000 mg total) by mouth 2 (two) times daily with a meal. Replaces: metFORMIN 500 MG (MOD) 24 hr tablet   MULTIVITAMIN PO Take by mouth daily.   PARoxetine 10 MG tablet Commonly known as: PAXIL Take 10 mg by mouth daily.   pravastatin 20 MG tablet Commonly known as: PRAVACHOL Take 20 mg by mouth daily.   sodium chloride 1 g tablet Take 2 tablets (2 g total) by mouth 2 (two) times daily with a meal.   tamsulosin 0.4 MG Caps capsule Commonly known as: FLOMAX Take 0.4 mg by mouth.               Durable  Medical Equipment  (From admission, onward)           Start     Ordered   06/13/21 1318  For home use only DME Hospital bed  Once       Question Answer Comment  Length of Need 6 Months   Patient has (list medical condition): generalized muscle weakness   The above medical condition requires: Patient requires the ability to reposition frequently   Head must be elevated greater than: 30 degrees   Bed type Semi-electric      06/13/21 1318            Follow-up Information     Derinda Late, MD. Schedule an appointment as soon as possible for a visit in 1 week(s).   Specialty: Family Medicine Why: Hospital follow-up Contact information: [address] [place] [facility] [place] [address] [phone]         Lloyd Huger, MD. Go to.   Specialty: Oncology Why: On the appointment given through cancer center Contact information: [place] [address] [phone]                Discharge Exam: There were no vitals filed for this visit.   Condition at discharge: fair  The results of significant diagnostics from this hospitalization (including imaging, microbiology, ancillary and laboratory) are listed below for reference.   Imaging Studies: DG Chest 2 View  Result Date: 06/16/2021 CLINICAL DATA:  Hyponatremia. EXAM: CHEST - 2 VIEW COMPARISON:  None. FINDINGS: Interstitial opacities in the right upper lung. Prominence of the right paratracheal/suprahilar mediastinal contour. No confluent consolidation. No visible pleural effusions or pneumothorax. No evidence of cardiomegaly. No evidence of acute osseous abnormality. IMPRESSION: 1. Interstitial opacities in the right upper lung, which could represent atypical infection or asymmetric edema. 2. Prominence of the right paratracheal/suprahilar mediastinal contour. This could potentially be vascular, but recommend CT of the chest to exclude adenopathy,  mass or aneurysm. These results will be called to the ordering clinician or representative by the Radiologist Assistant, and communication documented in the PACS or [employer]. Electronically Signed   By: Margaretha Sheffield M.D.   On: 06/16/2021 08:09   DG Shoulder Right  Result Date: 06/09/2021 CLINICAL DATA:  Fall, right shoulder pain EXAM: RIGHT SHOULDER - 2+ VIEW COMPARISON:  None. FINDINGS: Expected glenohumeral alignment without visible fracture. Mild anterior glenoid spurring. Both frontal projections of the humerus have the humerus somewhat internally rotated. Atherosclerotic calcification of the aortic arch. Mild prominence of the right border of the ascending thoracic aorta is nonspecific, although can sometimes be associated with ascending thoracic aortic aneurysm. IMPRESSION: 1. No fracture, dislocation, or acute bony findings. 2.  Aortic Atherosclerosis (  ICD10-I70.0). 3. Mild prominence of the right contour of the mediastinum is nonspecific, but ascending thoracic aortic aneurysm cannot be readily excluded. Electronically Signed   By: Van Clines M.D.   On: 06/09/2021 18:42   CT Head Wo Contrast  Result Date: 06/10/2021 CLINICAL DATA:  yesterday for a fall, family reports pt fell again this morning around [place] states since the pt has had increased confusion, not making any since, "babbling EXAM: CT HEAD WITHOUT CONTRAST TECHNIQUE: Contiguous axial images were obtained from the base of the skull through the vertex without intravenous contrast. RADIATION DOSE REDUCTION: This exam was performed according to the departmental dose-optimization program which includes automated exposure control, adjustment of the mA and/or kV according to patient size and/or use of iterative reconstruction technique. COMPARISON:  CT head 06/09/2021, MRI head 06/09/2021 BRAIN: BRAIN Cerebral ventricle sizes are concordant with the degree of cerebral volume loss. Similar-appearing left parietal lobe hypodensity  along the white matter that is better evaluated on MRI head 06/09/2021. Patchy and confluent areas of decreased attenuation are noted throughout the deep and periventricular white matter of the cerebral hemispheres bilaterally, compatible with chronic microvascular ischemic disease. No evidence of large-territorial acute infarction. No parenchymal hemorrhage. No mass lesion. No extra-axial collection. No mass effect or midline shift. No hydrocephalus. Basilar cisterns are patent. Vascular: No hyperdense vessel. Atherosclerotic calcifications are present within the cavernous internal carotid arteries. Skull: No acute fracture or focal lesion. Sinuses/Orbits: Paranasal sinuses and mastoid air cells are clear. Bilateral lens replacement. Otherwise the orbits are unremarkable. Other: None. IMPRESSION: 1. No acute intracranial abnormality. 2. Similar-appearing left parietal lobe hypodensity along the white matter that is better evaluated on MRI head 06/09/2021. Per MRI, finding concerning for a low-grade neoplasm. " Consider follow-up brain MRI in 6-12 weeks." Electronically Signed   By: Iven Finn M.D.   On: 06/10/2021 15:26   CT Head Wo Contrast  Result Date: 06/09/2021 CLINICAL DATA:  Trauma EXAM: CT HEAD WITHOUT CONTRAST TECHNIQUE: Contiguous axial images were obtained from the base of the skull through the vertex without intravenous contrast. RADIATION DOSE REDUCTION: This exam was performed according to the departmental dose-optimization program which includes automated exposure control, adjustment of the mA and/or kV according to patient size and/or use of iterative reconstruction technique. COMPARISON:  None. FINDINGS: Brain: There are no signs of bleeding within the cranium. There is 3.5 cm area of low-density in the left parietal lobe in the subcortical/periventricular location. There is no significant focal mass effect. Cortical sulci are prominent. Vascular: Unremarkable. Skull: Unremarkable  Sinuses/Orbits: There is mild mucosal thickening in the ethmoid sinus. Mucous retention cyst is seen in the right maxillary sinus. Other: None IMPRESSION: There are no signs of bleeding or focal mass effect in the noncontrast images of brain. There is 3.5 cm focus of low-density in the left parietal lobe in the subcortical and periventricular location. This may suggest recent or subacute or chronic infarct in the left MCA distribution. Follow-up MRI as clinically warranted should be considered. Atrophy.  Small-vessel disease. Chronic sinusitis. Electronically Signed   By: Elmer Picker M.D.   On: 06/09/2021 18:20   CT CHEST W CONTRAST  Result Date: 06/16/2021 CLINICAL DATA:  Abnormal radiograph, adenopathy. History diabetes mellitus, hypertension EXAM: CT CHEST WITH CONTRAST TECHNIQUE: Multidetector CT imaging of the chest was performed during intravenous contrast administration. RADIATION DOSE REDUCTION: This exam was performed according to the departmental dose-optimization program which includes automated exposure control, adjustment of the mA and/or kV according to  patient size and/or use of iterative reconstruction technique. CONTRAST:  60mL OMNIPAQUE IOHEXOL 300 MG/ML SOLN IV COMPARISON:  Chest radiograph 06/16/2021 FINDINGS: Cardiovascular: Atherosclerotic calcifications aorta and coronary arteries. 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 Heart unremarkable. No pericardial effusion. Mediastinum/Nodes: Esophagus unremarkable. Enlarged inferior LEFT cervical node 13 mm image 9. Enlarged inferior RIGHT cervical node 11 mm image 8. Numerous enlarged mediastinal and hilar nodes consistent with extensive adenopathy. Largest nodes measure 22 mm RIGHT hilum, 18 mm subcarinal, 22 mm RIGHT paratracheal, and 12 mm LEFT para-aortic. Lungs/Pleura: Minimal RIGHT pleural effusion. Minimal dependent atelectasis in lower lobes. RIGHT perihilar mass, macrolobulated, 28 x 22 mm image  62, question neoplasm versus adenopathy. Additional RIGHT lower lobe mass superior segment 11 x 10 mm image 66. Calcified granuloma RIGHT lower lobe. Interseptal thickening in the upper lobes questionable subpleural nodule LEFT upper lobe 4 mm image 31. No pulmonary infiltrate or pneumothorax. Upper Abdomen: Numerous calcified granulomata within spleen. Visualized upper abdomen otherwise unremarkable. Musculoskeletal: Age-indeterminate compression fracture midthoracic spine at T7. No definite metastatic bone lesions seen. IMPRESSION: Extensive mediastinal, hilar and lower cervical adenopathy. RIGHT perihilar mass 28 x 22 mm question primary neoplasm versus adenopathy. Nonspecific 11 mm RIGHT lower lobe nodule. Differential diagnosis includes lung cancer with extensive adenopathy, metastatic disease, and lymphoma. Old granulomatous disease. Age-indeterminate compression fracture midthoracic spine at T7. Aortic Atherosclerosis (ICD10-I70.0). Electronically Signed   By: Lavonia Dana M.D.   On: 06/16/2021 15:50   CT Cervical Spine Wo Contrast  Result Date: 06/09/2021 CLINICAL DATA:  Facial trauma, fell downstairs EXAM: CT CERVICAL SPINE WITHOUT CONTRAST TECHNIQUE: Multidetector CT imaging of the cervical spine was performed without intravenous contrast. Multiplanar CT image reconstructions were also generated. RADIATION DOSE REDUCTION: This exam was performed according to the departmental dose-optimization program which includes automated exposure control, adjustment of the mA and/or kV according to patient size and/or use of iterative reconstruction technique. COMPARISON:  None. FINDINGS: Alignment: Alignment is grossly anatomic. Skull base and vertebrae: No acute fracture. No primary bone lesion or focal pathologic process. Soft tissues and spinal canal: No prevertebral fluid or swelling. No visible canal hematoma. Disc levels: Multilevel spondylosis greatest at C3-4, C5-6, and C6-7. There is mild diffuse facet  hypertrophy, left predominant. Upper chest: Airway is patent.  Lung apices are clear. Other: Reconstructed images demonstrate no additional findings. IMPRESSION: 1. No acute cervical spine fracture. 2. Multilevel spondylosis and facet hypertrophy, greatest at C3-4, C5-6, and C6-7. Electronically Signed   By: Randa Ngo M.D.   On: 06/09/2021 18:23   MR BRAIN WO CONTRAST  Result Date: 06/09/2021 CLINICAL DATA:  Stroke follow-up EXAM: MRI HEAD WITHOUT CONTRAST TECHNIQUE: Multiplanar, multiecho pulse sequences of the brain and surrounding structures were obtained without intravenous contrast. COMPARISON:  None. FINDINGS: Brain: No acute infarct, mass effect or extra-axial collection. No acute or chronic hemorrhage. There is an area of abnormal hyperintense T2-weighted signal within the left parietal white matter. No midline shift or other mass effect. The midline structures are normal. Vascular: Major flow voids are preserved. Skull and upper cervical spine: Normal calvarium and skull base. Visualized upper cervical spine and soft tissues are normal. Sinuses/Orbits:Left mastoid effusion.  Normal orbits. IMPRESSION: 1. No acute intracranial abnormality. 2. Area of abnormal hyperintense T2-weighted signal within the left parietal white matter, concerning for low grade neoplasm. Contrast administration may be helpful. Consider follow-up brain MRI in 6-12 weeks. Electronically Signed   By: Ulyses Jarred M.D.   On: 06/09/2021 23:04  MR BRAIN W WO CONTRAST  Result Date: 06/13/2021 CLINICAL DATA:  Brain/CNS neoplasm, staging Neuro deficit, persistent/recurrent, CNS neoplasm suspected further evaluation possible neoplasm L parietal lobe seen on noncon MRI. EXAM: MRI HEAD WITHOUT AND WITH CONTRAST TECHNIQUE: Multiplanar, multiecho pulse sequences of the brain and surrounding structures were obtained without and with intravenous contrast. CONTRAST:  7.13mL GADAVIST GADOBUTROL 1 MMOL/ML IV SOLN COMPARISON:  Brain MRI  06/09/2021 FINDINGS: Brain: No acute infarct, mass effect or extra-axial collection. No acute or chronic hemorrhage. Hyperintense T2-weighted signal in the left parietal white matter. Generalized volume loss. The midline structures are normal. There is no abnormal contrast enhancement at the site of the left parietal abnormality. Vascular: Major flow voids are preserved. Skull and upper cervical spine: Normal calvarium and skull base. Visualized upper cervical spine and soft tissues are normal. Sinuses/Orbits:Left mastoid effusion.  Normal orbits. IMPRESSION: 1. No abnormal contrast enhancement at the site of the left parietal abnormality. This is favored to be a benign process such as chronic ischemia. However, a follow-up brain MRI in 6 months is recommended to ensure stability. 2. Generalized volume loss and findings of chronic microvascular disease. 3. Left mastoid effusion. Electronically Signed   By: Ulyses Jarred M.D.   On: 06/13/2021 20:00   EEG adult  Result Date: 06/15/2021 Lora Havens, MD     06/15/2021  1:56 PM Patient Name: Shyhiem Beeney MRN: [id number] Epilepsy Attending: Lora Havens Referring Physician/Provider: Kerney Elbe, MD Date: 06/15/2021 Duration: 21.06 mins Patient history: 81 year old male with fluctuating altered mental status, MRI brain showed left parietal lesion.  EEG to evaluate for seizure. Level of alertness: Awake AEDs during EEG study: None Technical aspects: This EEG study was done with scalp electrodes positioned according to the 10-20 International system of electrode placement. Electrical activity was acquired at a sampling rate of 500Hz  and reviewed with a high frequency filter of 70Hz  and a low frequency filter of 1Hz . EEG data were recorded continuously and digitally stored. Description: The posterior dominant rhythm consists of 8 Hz activity of moderate voltage (25-35 uV) seen predominantly in posterior head regions, symmetric and reactive to eye opening and eye  closing. Physiologic photic driving was not seen during photic stimulation.  Hyperventilation was not performed.   IMPRESSION: This study is within normal limits. No seizures or epileptiform discharges were seen throughout the recording. Lora Havens    Microbiology: Results for orders placed or performed during the hospital encounter of 06/10/21  Urine Culture     Status: Abnormal   Collection Time: 06/10/21  3:42 PM   Specimen: Urine, Random  Result Value Ref Range Status   Specimen Description   Final    URINE, RANDOM Performed at [facility], [address] [place], [place] [address]    Special Requests   Final    Normal Performed at [facility], [address] [place], [place] [address]    Culture >=100,000 COLONIES/mL SERRATIA MARCESCENS (A)  Final   Report Status 06/13/2021 FINAL  Final   Organism ID, Bacteria SERRATIA MARCESCENS (A)  Final      Susceptibility   Serratia marcescens - MIC*    CEFAZOLIN >=64 RESISTANT Resistant     CEFEPIME <=0.12 SENSITIVE Sensitive     CEFTRIAXONE <=0.25 SENSITIVE Sensitive     CIPROFLOXACIN <=0.25 SENSITIVE Sensitive     GENTAMICIN <=1 SENSITIVE Sensitive     NITROFURANTOIN 256 RESISTANT Resistant     TRIMETH/SULFA <=20 SENSITIVE Sensitive     * >=  100,000 COLONIES/mL SERRATIA MARCESCENS  Resp Panel by RT-PCR (Flu A&B, Covid) Nasopharyngeal Swab     Status: None   Collection Time: 06/10/21  4:41 PM   Specimen: Nasopharyngeal Swab; Nasopharyngeal(NP) swabs in vial transport medium  Result Value Ref Range Status   SARS Coronavirus 2 by RT PCR NEGATIVE NEGATIVE Final    Comment: (NOTE) SARS-CoV-2 target nucleic acids are NOT DETECTED.  The SARS-CoV-2 RNA is generally detectable in upper respiratory specimens during the acute phase of infection. The lowest concentration of SARS-CoV-2 viral copies this assay can detect is 138 copies/mL. A negative result does not preclude SARS-Cov-2 infection and should not be  used as the sole basis for treatment or other patient management decisions. A negative result may occur with  improper specimen collection/handling, submission of specimen other than nasopharyngeal swab, presence of viral mutation(s) within the areas targeted by this assay, and inadequate number of viral copies(<138 copies/mL). A negative result must be combined with clinical observations, patient history, and epidemiological information. The expected result is Negative.  Fact Sheet for Patients:  EntrepreneurPulse.com.au  Fact Sheet for Healthcare Providers:  IncredibleEmployment.be  This test is no t yet approved or cleared by the [place] FDA and  has been authorized for detection and/or diagnosis of SARS-CoV-2 by FDA under an Emergency Use Authorization (EUA). This EUA will remain  in effect (meaning this test can be used) for the duration of the COVID-19 declaration under Section 564(b)(1) of the Act, 21 U.S.C.section 360bbb-3(b)(1), unless the authorization is terminated  or revoked sooner.       Influenza A by PCR NEGATIVE NEGATIVE Final   Influenza B by PCR NEGATIVE NEGATIVE Final    Comment: (NOTE) The Xpert Xpress SARS-CoV-2/FLU/RSV plus assay is intended as an aid in the diagnosis of influenza from Nasopharyngeal swab specimens and should not be used as a sole basis for treatment. Nasal washings and aspirates are unacceptable for Xpert Xpress SARS-CoV-2/FLU/RSV testing.  Fact Sheet for Patients: EntrepreneurPulse.com.au  Fact Sheet for Healthcare Providers: IncredibleEmployment.be  This test is not yet approved or cleared by the [place] FDA and has been authorized for detection and/or diagnosis of SARS-CoV-2 by FDA under an Emergency Use Authorization (EUA). This EUA will remain in effect (meaning this test can be used) for the duration of the COVID-19 declaration under Section  564(b)(1) of the Act, 21 U.S.C. section 360bbb-3(b)(1), unless the authorization is terminated or revoked.  Performed at [facility], [address] [place],  [address]     Labs: CBC: Recent Labs  Lab 06/12/21 [phone] 06/13/21 [phone] 06/14/21 0656 06/15/21 0457 06/16/21 0430  WBC 8.1 6.8 5.3 5.7 5.7  NEUTROABS 6.4 5.4 4.0 4.4 4.1  HGB 12.7* 11.9* 11.7* 11.6* 11.6*  HCT 34.1* 33.2* 32.0* 32.8* 31.8*  MCV 94.7 95.7 95.8 96.5 96.4  PLT 215 217 196 209 476   Basic Metabolic Panel: Recent Labs  Lab 06/13/21 0639 06/13/21 1301 06/13/21 1800 06/14/21 0656 06/14/21 5465 06/15/21 0457 06/15/21 0825 06/15/21 2347 06/16/21 0430 06/16/21 0844 06/16/21 1257 06/17/21 0500  NA 126* 124*   < > 128*   < > 127*   < > 128* 128* 129* 130* 131*  K 3.7 3.3*  --  3.9  --  3.4*  --   --  3.6  --   --   --   CL 93* 92*  --  90*  --  93*  --   --  94*  --   --   --  CO2 26 25  --  25  --  24  --   --  24  --   --   --   GLUCOSE 203* 111*  --  172*  --  176*  --   --  192*  --   --   --   BUN 9 10  --  8  --  11  --   --  11  --   --   --   CREATININE 0.50* 0.44*  --  0.48*  --  0.46*  --   --  0.49*  --   --   --   CALCIUM 8.0* 8.2*  --  8.1*  --  8.2*  --   --  8.1*  --   --   --   MG 1.7  --   --  1.6*  --  1.6*  --   --  1.6*  --   --  1.6*   < > = values in this interval not displayed.   Liver Function Tests: Recent Labs  Lab 06/12/21 0519 06/13/21 0639 06/14/21 0656 06/15/21 0457 06/16/21 0430  AST 79* 64* 53* 48* 39  ALT 50* 48* 45* 42 40  ALKPHOS 67 67 64 70 65  BILITOT 1.2 1.0 0.9 1.0 0.9  PROT 5.6* 5.6* 5.6* 5.6* 5.4*  ALBUMIN 3.3* 3.3* 3.1* 3.2* 3.0*   CBG: Recent Labs  Lab 06/16/21 0804 06/16/21 1156 06/16/21 1600 06/16/21 2057 06/17/21 0739  GLUCAP 182* 123* 116* 182* 186*    Discharge time spent:  30 minutes.  Signed: Fritzi Mandes, MD [facility] 06/17/2021

## 2021-06-22 ENCOUNTER — Telehealth: Payer: Self-pay

## 2021-06-22 NOTE — Telephone Encounter (Signed)
Spoke to patient's daughter, Gregary Signs. ?Appt moved to 9:00 on 06/24/2020. ?Nothing further needed.  ?

## 2021-06-22 NOTE — Telephone Encounter (Signed)
Called and LVM in regards to rescheduling an appt. Would like to see if patient can come in 3/13 in the afternoon.  ?

## 2021-06-23 ENCOUNTER — Encounter: Payer: Self-pay | Admitting: *Deleted

## 2021-06-23 NOTE — Progress Notes (Signed)
Referral received for navigator services. Chart/appts reviewed. Pt will need to follow up with Dr. Grayland Ormond about 1 week after bronchoscopy to discuss results and treatment planning. Pt scheduled for consult with Dr. Patsey Berthold on 3/15 to plan bronchoscopy. PET scan scheduled for 3/28. Will continue to follow and schedule pt for further follow up once bronchoscopy scheduled. Nothing further needed at this time.  ?

## 2021-06-24 ENCOUNTER — Other Ambulatory Visit: Payer: Self-pay

## 2021-06-24 ENCOUNTER — Other Ambulatory Visit
Admission: RE | Admit: 2021-06-24 | Discharge: 2021-06-24 | Disposition: A | Discharge status: Home / Self Care | Payer: Medicare HMO | Source: Ambulatory Visit | Attending: Pulmonary Disease | Admitting: Pulmonary Disease

## 2021-06-24 ENCOUNTER — Encounter: Payer: Self-pay | Admitting: Pulmonary Disease

## 2021-06-24 ENCOUNTER — Ambulatory Visit: Payer: Medicare HMO | Admitting: Pulmonary Disease

## 2021-06-24 VITALS — BP 122/64 | HR 86 | Temp 97.8°F | Ht 69.0 in | Wt 128.2 lb

## 2021-06-24 DIAGNOSIS — R5383 Other fatigue: Secondary | ICD-10-CM

## 2021-06-24 DIAGNOSIS — R590 Localized enlarged lymph nodes: Secondary | ICD-10-CM | POA: Diagnosis not present

## 2021-06-24 DIAGNOSIS — E871 Hypo-osmolality and hyponatremia: Secondary | ICD-10-CM

## 2021-06-24 DIAGNOSIS — Z79899 Other long term (current) drug therapy: Secondary | ICD-10-CM | POA: Insufficient documentation

## 2021-06-24 DIAGNOSIS — R918 Other nonspecific abnormal finding of lung field: Secondary | ICD-10-CM

## 2021-06-24 LAB — RENAL FUNCTION PANEL
Albumin: 3.8 g/dL (ref 3.5–5.0)
Anion gap: 10 (ref 5–15)
BUN: 20 mg/dL (ref 8–23)
CO2: 27 mmol/L (ref 22–32)
Calcium: 9.3 mg/dL (ref 8.9–10.3)
Chloride: 94 mmol/L — ABNORMAL LOW (ref 98–111)
Creatinine, Ser: 0.86 mg/dL (ref 0.61–1.24)
GFR, Estimated: >60 mL/min (ref >60)
Glucose, Bld: 170 mg/dL — ABNORMAL HIGH (ref 70–99)
Phosphorus: 3.3 mg/dL (ref 2.5–4.6)
Potassium: 4.1 mmol/L (ref 3.5–5.1)
Sodium: 131 mmol/L — ABNORMAL LOW (ref 135–145)

## 2021-06-24 LAB — CBC WITH DIFFERENTIAL/PLATELET
Abs Immature Granulocytes: 0.03 10*3/uL (ref 0.00–0.07)
Basophils Absolute: 0 10*3/uL (ref 0.0–0.1)
Basophils Relative: 0 %
Eosinophils Absolute: 0 10*3/uL (ref 0.0–0.5)
Eosinophils Relative: 0 %
HCT: 40 % (ref 39.0–52.0)
Hemoglobin: 13.8 g/dL (ref 13.0–17.0)
Immature Granulocytes: 0 %
Lymphocytes Relative: 8 %
Lymphs Abs: 0.7 10*3/uL (ref 0.7–4.0)
MCH: 34.4 pg — ABNORMAL HIGH (ref 26.0–34.0)
MCHC: 34.5 g/dL (ref 30.0–36.0)
MCV: 99.8 fL (ref 80.0–100.0)
Monocytes Absolute: 0.9 10*3/uL (ref 0.1–1.0)
Monocytes Relative: 10 %
Neutro Abs: 7.1 10*3/uL (ref 1.7–7.7)
Neutrophils Relative %: 82 %
Platelets: 297 10*3/uL (ref 150–400)
RBC: 4.01 MIL/uL — ABNORMAL LOW (ref 4.22–5.81)
RDW: 12 % (ref 11.5–15.5)
WBC: 8.7 10*3/uL (ref 4.0–10.5)
nRBC: 0 % (ref 0.0–0.2)

## 2021-06-24 LAB — MAGNESIUM: Magnesium: 1.9 mg/dL (ref 1.7–2.4)

## 2021-06-24 NOTE — Progress Notes (Signed)
? ?Subjective:  ? ? Patient ID: John Perez, male    DOB: December 26, 1940, 81 y.o.   MRN: [id number] ?Patient Care Team: ?Derinda Late, MD as PCP - General (Family Medicine) ?Lloyd Huger, MD as Consulting Physician (Oncology) ?Telford Nab, RN as Sales executive ? ?Chief Complaint  ?Patient presents with  ? pulmonary consult  ?  PET 07/07/2021-occ prod cough with clear sputum.   ? ?HPI ?Patient is an 81 year old very remote former smoker with 15-pack-year history of smoking who was admitted to [facility] on 10 June 2021 through 17 June 2021 after a mechanical fall at home, he was also noted to have worsening confusion.  The patient was noted at that time to have a sodium of 112.  He was admitted for management of severe hyponatremia and administration of 3% hypertonic saline.  Was also noted to have a urinary tract infection.  An MRI was performed that showed an abnormality on the left parietal area which with a subsequent MRI and contrast was noted to be perhaps a process such as chronic ischemia.  During the work-up for his hyponatremia a chest x-ray was performed which showed prominence of the right paratracheal/suprahilar mediastinal contour discerning for lymphadenopathy.  A chest CT was performed this showed extensive mediastinal/hilar and lower cervical adenopathy, a right perihilar mass 2.8 x 2.2 cm and a nonspecific 1.1 cm right lower lobe nodule.  The patient is due for a PET/CT on 28 March.  We are asked to evaluate him for potential bronchoscopy with EBUS. ? ?During the interview the patient stated that he had been quite active up until the time that he was admitted to the hospital.  He initially appeared to be fully oriented but then started talking about having been taken from the hospital somewhere "down [place]" to get therapy.  He seemed quite confused.  His wife and daughter were present in the room stated that he had been having some intermittent episodes of confusion.  Per the family he has  not had any issues with fevers, chills or sweats.  He has had significant weight loss, he weighted 145 pounds 3 weeks ago and is now down to 128 pounds.  He has had occasional cough, dry, no hemoptysis.  Does not endorse any dyspnea but has had significant fatigue.  No other symptoms noted. ? ?Review of Systems ?A 10 point review of systems was performed and it is as noted above otherwise negative. ? ?Past Medical History:  ?Diagnosis Date  ? Diabetes mellitus type 2, insulin dependent ([place])   ? HOH (hard of hearing)   ? Hypercholesteremia   ? Hypertension   ? ?Past Surgical History:  ?Procedure Laterality Date  ? CATARACT EXTRACTION W/PHACO Left 02/25/2020  ? Procedure: CATARACT EXTRACTION PHACO AND INTRAOCULAR LENS PLACEMENT ([place]) LEFT DIABETIC;  Surgeon: Eulogio Bear, MD;  Location: [place];  Service: Ophthalmology;  Laterality: Left;  4.98 ?0:39.4  ? CATARACT EXTRACTION W/PHACO Right 03/17/2020  ? Procedure: CATARACT EXTRACTION PHACO AND INTRAOCULAR LENS PLACEMENT ([place]) RIGHT DIABETIC;  Surgeon: Eulogio Bear, MD;  Location: [place];  Service: Ophthalmology;  Laterality: Right;  6.33 ?0:55.3  ? GUM SURGERY    ? HERNIA REPAIR    ? x2  ? ?Patient Active Problem List  ? Diagnosis Date Noted  ? Hyponatremia 06/10/2021  ? Abnormal brain MRI 06/10/2021  ? Diabetes mellitus type 2, insulin dependent ([place])   ? Abnormal LFTs   ? ?No family history on file. ? ?Social  History  ? ?Tobacco Use  ? Smoking status: Former  ?  Packs/day: 1.00  ?  Years: 15.00  ?  Pack years: 15.00  ?  Types: Cigarettes  ?  Quit date: 53  ?  Years since quitting: 33.2  ? Smokeless tobacco: Never  ?Substance Use Topics  ? Alcohol use: Not Currently  ? ?No Known Allergies ? ?Current Meds  ?Medication Sig  ? ASPIRIN 81 PO Take by mouth daily.  ? benazepril (LOTENSIN) 20 MG tablet Take 20 mg by mouth daily.  ? finasteride (PROSCAR) 5 MG tablet Take 5 mg by mouth daily.  ? furosemide (LASIX) 20 MG tablet Take 0.5  tablets (10 mg total) by mouth daily.  ? magnesium oxide (MAG-OX) 400 (240 Mg) MG tablet Take 1 tablet (400 mg total) by mouth 2 (two) times daily.  ? MELATONIN PO Take by mouth at bedtime as needed.  ? metFORMIN (GLUCOPHAGE) 1000 MG tablet Take 1 tablet (1,000 mg total) by mouth 2 (two) times daily with a meal.  ? Multiple Vitamin (MULTIVITAMIN PO) Take by mouth daily.  ? Omega-3 Fatty Acids (FISH OIL PO) Take by mouth daily.  ? PARoxetine (PAXIL) 10 MG tablet Take 10 mg by mouth daily.  ? pravastatin (PRAVACHOL) 20 MG tablet Take 20 mg by mouth daily.  ? sodium chloride 1 g tablet Take 2 tablets (2 g total) by mouth 2 (two) times daily with a meal.  ? tamsulosin (FLOMAX) 0.4 MG CAPS capsule Take 0.4 mg by mouth.  ? ?Immunization History  ?Administered Date(s) Administered  ? Influenza-Unspecified 01/30/2019, 02/20/2020  ? Zoster, Live 10/05/2013  ? ? ?   ?Objective:  ? Physical Exam ?BP 122/64 (BP Location: Left Arm, Cuff Size: Normal)   Pulse 86   Temp 97.8 ?F (36.6 ?C) (Temporal)   Ht 5\' 9"  (1.753 m)   Wt 128 lb 3.2 oz (58.2 kg)   SpO2 99%   BMI 18.93 kg/m?  ?GENERAL: Thin, frail appearing gentleman, presents with a walker.  He is confused. ?HEAD: Normocephalic, atraumatic.  ?EYES: Pupils equal, round, reactive to light.  No scleral icterus.  There is mild ptosis of the right eye lid, no mydriasis. ?MOUTH: Mild right nasolabial fold droop.  Poor dentition. ?NECK: Supple. No thyromegaly. Trachea midline. No JVD.  There is a LEFT lower cervical/supraclavicular node present, it is firm and poorly mobile.  Alden Server lower cervical lymph node on the right but less easily palpable than the left. ?PULMONARY: Good air entry bilaterally.  No adventitious sounds. ?CARDIOVASCULAR: S1 and S2. Regular rate and rhythm.  ?ABDOMEN: Scaphoid, benign. ?MUSCULOSKELETAL: No joint deformity, no clubbing, no edema.  ?NEUROLOGIC: Asymmetrical smile (slight), due to right nasolabial droop (mild), gait is unsteady, no other deficits  noted.  He is confused. ?SKIN: Intact,warm,dry. ?PSYCH: Flat affect, confused. ? ? ?Representative images from CT chest performed 16 June 2021, showing mediastinal adenopathy and right hilar mass: ? ? ? ? ?   ?Assessment & Plan:  ? ?  ICD-10-CM   ?1. Mediastinal adenopathy  R59.0 Magnesium  ?  Renal Function Panel  ?  CBC w/Diff  ? Also has supraclavicular lymphadenopathy ?PET/CT pending 07/07/2021 ?Malignancy until proven otherwise  ?  ?2. Lung mass  R91.8   ? Malignancy until proven otherwise  ?  ?3. Hyponatremia  E87.1   ? Check renal panel, mag levels ?Suspect small cell cancer vis-?-vis mediastinal adenopathy  ?  ?4. Other fatigue  R53.83   ? Check CBC, Querry anemia  ?  ? ?  Orders Placed This Encounter  ?Procedures  ? Magnesium  ?  Standing Status:   Future  ?  Standing Expiration Date:   06/25/2022  ? Renal Function Panel  ?  Standing Status:   Future  ?  Standing Expiration Date:   06/25/2022  ? CBC w/Diff  ?  Standing Status:   Future  ?  Standing Expiration Date:   06/25/2022  ? ?Patient is currently very frail has been having significant metabolic issues particularly with hyponatremia.  Confusion is also a concern.  In his current condition I am concerned that he will not tolerate general anesthesia for EBUS/TBNA well.  Discussed this with him and his family today.  He does have supraclavicular lymphadenopathy and this may be a potential site for biopsy that will require general anesthesia.  PET/CT should help identify other potential sites to biopsy if present.  I discussed the above with Dr. Grayland Ormond via telephone conversation.  Dr. Grayland Ormond will see the patient next week on Wednesday the palliative team.  We will reconvene with the patient and family after PET CT is done. ? ? ?C. Derrill Kay, MD ?Advanced Bronchoscopy ?[facility] ? ? ? ?*This note was dictated using voice recognition software/Dragon.  Despite best efforts to proofread, errors can occur which can change the meaning.  Any transcriptional errors that result from this process are unintentional and may not be fully corrected at the time of dictation. ?

## 2021-06-24 NOTE — Patient Instructions (Addendum)
I will talk to Dr. Grayland Ormond today to see if we can move up your PET/CT. ? ?We are getting some blood work today so that I will be available for Dr. Baldemar Lenis tomorrow to review. ? ?We will see you in follow-up on 4 April at 3:30 PM. ? ? ? ? ?

## 2021-06-25 DIAGNOSIS — G4700 Insomnia, unspecified: Secondary | ICD-10-CM | POA: Diagnosis not present

## 2021-06-25 DIAGNOSIS — I10 Essential (primary) hypertension: Secondary | ICD-10-CM | POA: Diagnosis not present

## 2021-06-25 DIAGNOSIS — E871 Hypo-osmolality and hyponatremia: Secondary | ICD-10-CM | POA: Diagnosis not present

## 2021-06-28 DIAGNOSIS — R918 Other nonspecific abnormal finding of lung field: Secondary | ICD-10-CM | POA: Insufficient documentation

## 2021-06-28 NOTE — Progress Notes (Deleted)
?[place]  ?[phone] [phone] ? ?ID: John Perez OB: 1940-06-06  MR#: [id number]  BWG#:[id number] ? ?Patient Care Team: ?Derinda Late, MD as PCP - General (Family Medicine) ?Lloyd Huger, MD as Consulting Physician (Oncology) ?Telford Nab, RN as Sales executive ? ?CHIEF COMPLAINT:  Right perihilar mass with lymphadenopathy, hyponatremia ? ?INTERVAL HISTORY: Patient is an 81 year old male who was recently admitted to the hospital with increasing weakness and fatigue and confusion.  He was found to have significant hyponatremia.  CT scan chest also revealed a right perihilar mass with mediastinal and hilar lymphadenopathy highly suspicious for underlying malignancy.  Patient continues to complain of weakness and fatigue, but otherwise feels well.  He has no neurologic complaints.  He denies any recent fevers.  He has a fair appetite, but denies weight loss.  He has no chest pain, shortness of breath, cough, or hemoptysis.  He denies any nausea, vomiting, constipation, or diarrhea.  He has no urinary complaints.  Patient offers no further specific complaints today. ? ?REVIEW OF SYSTEMS:   ?ROS ? ?As per HPI. Otherwise, a complete review of systems is negative. ? ?PAST MEDICAL HISTORY: ?Past Medical History:  ?Diagnosis Date  ? Diabetes mellitus type 2, insulin dependent ([place])   ? HOH (hard of hearing)   ? Hypercholesteremia   ? Hypertension   ? ? ?PAST SURGICAL HISTORY: ?Past Surgical History:  ?Procedure Laterality Date  ? CATARACT EXTRACTION W/PHACO Left 02/25/2020  ? Procedure: CATARACT EXTRACTION PHACO AND INTRAOCULAR LENS PLACEMENT ([place]) LEFT DIABETIC;  Surgeon: Eulogio Bear, MD;  Location: [place];  Service: Ophthalmology;  Laterality: Left;  4.98 ?0:39.4  ? CATARACT EXTRACTION W/PHACO Right 03/17/2020  ? Procedure: CATARACT EXTRACTION PHACO AND INTRAOCULAR LENS PLACEMENT ([place]) RIGHT DIABETIC;  Surgeon: Eulogio Bear, MD;   Location: [place];  Service: Ophthalmology;  Laterality: Right;  6.33 ?0:55.3  ? GUM SURGERY    ? HERNIA REPAIR    ? x2  ? ? ?FAMILY HISTORY: ?No family history on file. ? ?ADVANCED DIRECTIVES (Y/N):  N ? ?HEALTH MAINTENANCE: ?Social History  ? ?Tobacco Use  ? Smoking status: Former  ?  Packs/day: 1.00  ?  Years: 15.00  ?  Pack years: 15.00  ?  Types: Cigarettes  ?  Quit date: 30  ?  Years since quitting: 33.2  ? Smokeless tobacco: Never  ?Vaping Use  ? Vaping Use: Never used  ?Substance Use Topics  ? Alcohol use: Not Currently  ? ? ? Colonoscopy: ? PAP: ? Bone density: ? Lipid panel: ? ?No Known Allergies ? ?Current Outpatient Medications  ?Medication Sig Dispense Refill  ? ASPIRIN 81 PO Take by mouth daily.    ? benazepril (LOTENSIN) 20 MG tablet Take 20 mg by mouth daily.    ? finasteride (PROSCAR) 5 MG tablet Take 5 mg by mouth daily.    ? furosemide (LASIX) 20 MG tablet Take 0.5 tablets (10 mg total) by mouth daily. 30 tablet 0  ? magnesium oxide (MAG-OX) 400 (240 Mg) MG tablet Take 1 tablet (400 mg total) by mouth 2 (two) times daily. 60 tablet 1  ? MELATONIN PO Take by mouth at bedtime as needed.    ? metFORMIN (GLUCOPHAGE) 1000 MG tablet Take 1 tablet (1,000 mg total) by mouth 2 (two) times daily with a meal. 60 tablet 1  ? Multiple Vitamin (MULTIVITAMIN PO) Take by mouth daily.    ? Omega-3 Fatty Acids (FISH OIL PO) Take by  mouth daily.    ? PARoxetine (PAXIL) 10 MG tablet Take 10 mg by mouth daily.    ? pravastatin (PRAVACHOL) 20 MG tablet Take 20 mg by mouth daily.    ? sodium chloride 1 g tablet Take 2 tablets (2 g total) by mouth 2 (two) times daily with a meal. 60 tablet 0  ? tamsulosin (FLOMAX) 0.4 MG CAPS capsule Take 0.4 mg by mouth.    ? ?No current facility-administered medications for this visit.  ? ? ?OBJECTIVE: ?There were no vitals filed for this visit.   There is no height or weight on file to calculate BMI.    ECOG FS:{CHL ONC AC:[id number]} ? ?General: Well-developed,  well-nourished, no acute distress. ?Eyes: Pink conjunctiva, anicteric sclera. ?HEENT: Normocephalic, moist mucous membranes. ?Lungs: No audible wheezing or coughing. ?Heart: Regular rate and rhythm. ?Abdomen: Soft, nontender, no obvious distention. ?Musculoskeletal: No edema, cyanosis, or clubbing. ?Neuro: Alert, answering all questions appropriately. Cranial nerves grossly intact. ?Skin: No rashes or petechiae noted. ?Psych: Normal affect. ?Lymphatics: No cervical, calvicular, axillary or inguinal LAD. ? ? ?LAB RESULTS: ? ?Lab Results  ?Component Value Date  ? NA 131 (L) 06/24/2021  ? K 4.1 06/24/2021  ? CL 94 (L) 06/24/2021  ? CO2 27 06/24/2021  ? GLUCOSE 170 (H) 06/24/2021  ? BUN 20 06/24/2021  ? CREATININE 0.86 06/24/2021  ? CALCIUM 9.3 06/24/2021  ? PROT 5.4 (L) 06/16/2021  ? ALBUMIN 3.8 06/24/2021  ? AST 39 06/16/2021  ? ALT 40 06/16/2021  ? ALKPHOS 65 06/16/2021  ? BILITOT 0.9 06/16/2021  ? GFRNONAA >60 06/24/2021  ? ? ?Lab Results  ?Component Value Date  ? WBC 8.7 06/24/2021  ? NEUTROABS 7.1 06/24/2021  ? HGB 13.8 06/24/2021  ? HCT 40.0 06/24/2021  ? MCV 99.8 06/24/2021  ? PLT 297 06/24/2021  ? ? ? ?STUDIES: ?DG Chest 2 View ? ?Result Date: 06/16/2021 ?CLINICAL DATA:  Hyponatremia. EXAM: CHEST - 2 VIEW COMPARISON:  None. FINDINGS: Interstitial opacities in the right upper lung. Prominence of the right paratracheal/suprahilar mediastinal contour. No confluent consolidation. No visible pleural effusions or pneumothorax. No evidence of cardiomegaly. No evidence of acute osseous abnormality. IMPRESSION: 1. Interstitial opacities in the right upper lung, which could represent atypical infection or asymmetric edema. 2. Prominence of the right paratracheal/suprahilar mediastinal contour. This could potentially be vascular, but recommend CT of the chest to exclude adenopathy, mass or aneurysm. These results will be called to the ordering clinician or representative by the Radiologist Assistant, and communication  documented in the PACS or [employer]. Electronically Signed   By: Margaretha Sheffield M.D.   On: 06/16/2021 08:09  ? ?DG Shoulder Right ? ?Result Date: 06/09/2021 ?CLINICAL DATA:  Fall, right shoulder pain EXAM: RIGHT SHOULDER - 2+ VIEW COMPARISON:  None. FINDINGS: Expected glenohumeral alignment without visible fracture. Mild anterior glenoid spurring. Both frontal projections of the humerus have the humerus somewhat internally rotated. Atherosclerotic calcification of the aortic arch. Mild prominence of the right border of the ascending thoracic aorta is nonspecific, although can sometimes be associated with ascending thoracic aortic aneurysm. IMPRESSION: 1. No fracture, dislocation, or acute bony findings. 2.  Aortic Atherosclerosis (ICD10-I70.0). 3. Mild prominence of the right contour of the mediastinum is nonspecific, but ascending thoracic aortic aneurysm cannot be readily excluded. Electronically Signed   By: Van Clines M.D.   On: 06/09/2021 18:42  ? ?CT Head Wo Contrast ? ?Result Date: 06/10/2021 ?CLINICAL DATA:  yesterday for a fall, family reports pt fell again  this morning around 6a states since the pt has had increased confusion, not making any since, "babbling EXAM: CT HEAD WITHOUT CONTRAST TECHNIQUE: Contiguous axial images were obtained from the base of the skull through the vertex without intravenous contrast. RADIATION DOSE REDUCTION: This exam was performed according to the departmental dose-optimization program which includes automated exposure control, adjustment of the mA and/or kV according to patient size and/or use of iterative reconstruction technique. COMPARISON:  CT head 06/09/2021, MRI head 06/09/2021 BRAIN: BRAIN Cerebral ventricle sizes are concordant with the degree of cerebral volume loss. Similar-appearing left parietal lobe hypodensity along the white matter that is better evaluated on MRI head 06/09/2021. Patchy and confluent areas of decreased attenuation are noted  throughout the deep and periventricular white matter of the cerebral hemispheres bilaterally, compatible with chronic microvascular ischemic disease. No evidence of large-territorial acute infarction. No parenchymal hem

## 2021-07-01 DIAGNOSIS — E871 Hypo-osmolality and hyponatremia: Secondary | ICD-10-CM | POA: Diagnosis not present

## 2021-07-02 ENCOUNTER — Inpatient Hospital Stay: Payer: Medicare HMO | Admitting: Hospice and Palliative Medicine

## 2021-07-02 ENCOUNTER — Inpatient Hospital Stay: Payer: Medicare HMO | Admitting: Oncology

## 2021-07-02 DIAGNOSIS — R918 Other nonspecific abnormal finding of lung field: Secondary | ICD-10-CM

## 2021-07-03 DIAGNOSIS — I10 Essential (primary) hypertension: Secondary | ICD-10-CM | POA: Diagnosis not present

## 2021-07-03 DIAGNOSIS — G4700 Insomnia, unspecified: Secondary | ICD-10-CM | POA: Diagnosis not present

## 2021-07-03 DIAGNOSIS — E871 Hypo-osmolality and hyponatremia: Secondary | ICD-10-CM | POA: Diagnosis not present

## 2021-07-03 DIAGNOSIS — Z79899 Other long term (current) drug therapy: Secondary | ICD-10-CM | POA: Diagnosis not present

## 2021-07-07 ENCOUNTER — Other Ambulatory Visit: Payer: Self-pay

## 2021-07-07 ENCOUNTER — Ambulatory Visit (HOSPITAL_COMMUNITY)
Admission: RE | Admit: 2021-07-07 | Discharge: 2021-07-07 | Disposition: A | Discharge status: Home / Self Care | Payer: Medicare HMO | Source: Ambulatory Visit | Attending: Oncology | Admitting: Oncology

## 2021-07-07 DIAGNOSIS — R222 Localized swelling, mass and lump, trunk: Secondary | ICD-10-CM | POA: Insufficient documentation

## 2021-07-07 DIAGNOSIS — R590 Localized enlarged lymph nodes: Secondary | ICD-10-CM | POA: Diagnosis not present

## 2021-07-07 DIAGNOSIS — I700 Atherosclerosis of aorta: Secondary | ICD-10-CM | POA: Diagnosis not present

## 2021-07-07 DIAGNOSIS — R918 Other nonspecific abnormal finding of lung field: Secondary | ICD-10-CM | POA: Diagnosis not present

## 2021-07-07 LAB — GLUCOSE, CAPILLARY
Glucose-Capillary: 270 mg/dL — ABNORMAL HIGH (ref 70–99)
Glucose-Capillary: 275 mg/dL — ABNORMAL HIGH (ref 70–99)

## 2021-07-07 MED ORDER — FLUDEOXYGLUCOSE F - 18 (FDG) INJECTION
6.2800 | Freq: Once | INTRAVENOUS | Status: AC | PRN
  Administered 2021-07-07: 6.28 via INTRAVENOUS

## 2021-07-07 NOTE — Progress Notes (Signed)
?[facility]  ?[phone] [phone] ? ?ID: Talmadge Coventry OB: 04/19/1940  MR#: [id number]  NIO#:[id number] ? ?Patient Care Team: ?Derinda Late, MD as PCP - General (Family Medicine) ?Lloyd Huger, MD as Consulting Physician (Oncology) ?Telford Nab, RN as Sales executive ? ?CHIEF COMPLAINT:  Right perihilar mass with lymphadenopathy, hyponatremia ? ?INTERVAL HISTORY: Patient returns to clinic today for hospital follow-up and discussion of his PET scan results.  He feels significantly improved and nearly back to his baseline.  His only complaint today is persistent insomnia which she reports his primary care physician is addressing.  He otherwise feels well.  He has no neurologic complaints.  He does not complain of pain.  He denies any weakness or fatigue today. He denies any recent fevers.  He has a fair appetite, but denies weight loss.  He has no chest pain, shortness of breath, cough, or hemoptysis.  He denies any nausea, vomiting, constipation, or diarrhea.  He has no urinary complaints.  Patient offers no further specific complaints today. ? ?REVIEW OF SYSTEMS:   ?Review of Systems  ?Constitutional: Negative.  Negative for fever, malaise/fatigue and weight loss.  ?Respiratory: Negative.  Negative for cough, hemoptysis and shortness of breath.   ?Cardiovascular: Negative.  Negative for chest pain and leg swelling.  ?Gastrointestinal: Negative.  Negative for abdominal pain.  ?Genitourinary: Negative.  Negative for dysuria and hematuria.  ?Musculoskeletal: Negative.  Negative for back pain.  ?Skin: Negative.  Negative for rash.  ?Neurological: Negative.  Negative for dizziness, focal weakness, weakness and headaches.  ?Psychiatric/Behavioral:  The patient has insomnia. The patient is not nervous/anxious.   ? ?As per HPI. Otherwise, a complete review of systems is negative. ? ?PAST MEDICAL HISTORY: ?Past Medical History:  ?Diagnosis Date  ? Diabetes  mellitus type 2, insulin dependent ([place])   ? HOH (hard of hearing)   ? Hypercholesteremia   ? Hypertension   ? ? ?PAST SURGICAL HISTORY: ?Past Surgical History:  ?Procedure Laterality Date  ? CATARACT EXTRACTION W/PHACO Left 02/25/2020  ? Procedure: CATARACT EXTRACTION PHACO AND INTRAOCULAR LENS PLACEMENT ([place]) LEFT DIABETIC;  Surgeon: Eulogio Bear, MD;  Location: [facility];  Service: Ophthalmology;  Laterality: Left;  4.98 ?0:39.4  ? CATARACT EXTRACTION W/PHACO Right 03/17/2020  ? Procedure: CATARACT EXTRACTION PHACO AND INTRAOCULAR LENS PLACEMENT ([place]) RIGHT DIABETIC;  Surgeon: Eulogio Bear, MD;  Location: [facility];  Service: Ophthalmology;  Laterality: Right;  6.33 ?0:55.3  ? GUM SURGERY    ? HERNIA REPAIR    ? x2  ? ? ?FAMILY HISTORY: ?History reviewed. No pertinent family history. ? ?ADVANCED DIRECTIVES (Y/N):  N ? ?HEALTH MAINTENANCE: ?Social History  ? ?Tobacco Use  ? Smoking status: Former  ?  Packs/day: 1.00  ?  Years: 15.00  ?  Pack years: 15.00  ?  Types: Cigarettes  ?  Quit date: 77  ?  Years since quitting: 33.2  ? Smokeless tobacco: Never  ?Vaping Use  ? Vaping Use: Never used  ?Substance Use Topics  ? Alcohol use: Not Currently  ? ? ? Colonoscopy: ? PAP: ? Bone density: ? Lipid panel: ? ?No Known Allergies ? ?Current Outpatient Medications  ?Medication Sig Dispense Refill  ? ASPIRIN 81 PO Take by mouth daily.    ? finasteride (PROSCAR) 5 MG tablet Take 5 mg by mouth daily.    ? metFORMIN (GLUCOPHAGE) 1000 MG tablet Take 1 tablet (1,000 mg total) by mouth 2 (two) times daily with a  meal. 60 tablet 1  ? PARoxetine (PAXIL) 10 MG tablet Take 10 mg by mouth daily.    ? pravastatin (PRAVACHOL) 20 MG tablet Take 20 mg by mouth daily.    ? tamsulosin (FLOMAX) 0.4 MG CAPS capsule Take 0.4 mg by mouth.    ? benazepril (LOTENSIN) 20 MG tablet Take 20 mg by mouth daily.    ? furosemide (LASIX) 20 MG tablet Take 0.5 tablets (10 mg total) by mouth daily. (Patient not taking:  Reported on 07/09/2021) 30 tablet 0  ? gabapentin (NEURONTIN) 300 MG capsule Take 1 capsule by mouth at bedtime. (Patient not taking: Reported on 07/09/2021)    ? LORazepam (ATIVAN) 0.5 MG tablet Take 0.5 mg by mouth daily as needed. (Patient not taking: Reported on 07/09/2021)    ? magnesium oxide (MAG-OX) 400 (240 Mg) MG tablet Take 1 tablet (400 mg total) by mouth 2 (two) times daily. (Patient not taking: Reported on 07/09/2021) 60 tablet 1  ? magnesium oxide (MAG-OX) 400 MG tablet Take by mouth. (Patient not taking: Reported on 07/09/2021)    ? MELATONIN PO Take by mouth at bedtime as needed. (Patient not taking: Reported on 07/09/2021)    ? Multiple Vitamin (MULTI-VITAMIN) tablet Take 1 tablet by mouth daily. (Patient not taking: Reported on 07/09/2021)    ? Multiple Vitamin (MULTIVITAMIN PO) Take by mouth daily. (Patient not taking: Reported on 07/09/2021)    ? Omega-3 Fatty Acids (FISH OIL PO) Take by mouth daily. (Patient not taking: Reported on 07/09/2021)    ? sodium chloride 1 g tablet Take 2 tablets (2 g total) by mouth 2 (two) times daily with a meal. (Patient not taking: Reported on 07/09/2021) 60 tablet 0  ? traZODone (DESYREL) 100 MG tablet Take by mouth. (Patient not taking: Reported on 07/09/2021)    ? ?No current facility-administered medications for this visit.  ? ? ?OBJECTIVE: ?Vitals:  ? 07/09/21 0958  ?BP: (!) 159/81  ?Pulse: 79  ?Resp: 14  ?Temp: 97.9 ?F (36.6 ?C)  ?SpO2: 100%  ?   Body mass index is 19.11 kg/m?Marland Kitchen    ECOG FS:1 - Symptomatic but completely ambulatory ? ?General: Thin, no acute distress. ?Eyes: Pink conjunctiva, anicteric sclera. ?HEENT: Normocephalic, moist mucous membranes. ?Lungs: No audible wheezing or coughing. ?Heart: Regular rate and rhythm. ?Abdomen: Soft, nontender, no obvious distention. ?Musculoskeletal: No edema, cyanosis, or clubbing. ?Neuro: Alert, answering all questions appropriately. Cranial nerves grossly intact. ?Skin: No rashes or petechiae noted. ?Psych: Normal  affect. ?Lymphatics: No cervical, calvicular, axillary or inguinal LAD. ? ? ?LAB RESULTS: ? ?Lab Results  ?Component Value Date  ? NA 131 (L) 06/24/2021  ? K 4.1 06/24/2021  ? CL 94 (L) 06/24/2021  ? CO2 27 06/24/2021  ? GLUCOSE 170 (H) 06/24/2021  ? BUN 20 06/24/2021  ? CREATININE 0.86 06/24/2021  ? CALCIUM 9.3 06/24/2021  ? PROT 5.4 (L) 06/16/2021  ? ALBUMIN 3.8 06/24/2021  ? AST 39 06/16/2021  ? ALT 40 06/16/2021  ? ALKPHOS 65 06/16/2021  ? BILITOT 0.9 06/16/2021  ? GFRNONAA >60 06/24/2021  ? ? ?Lab Results  ?Component Value Date  ? WBC 8.7 06/24/2021  ? NEUTROABS 7.1 06/24/2021  ? HGB 13.8 06/24/2021  ? HCT 40.0 06/24/2021  ? MCV 99.8 06/24/2021  ? PLT 297 06/24/2021  ? ? ? ?STUDIES: ?DG Chest 2 View ? ?Result Date: 06/16/2021 ?CLINICAL DATA:  Hyponatremia. EXAM: CHEST - 2 VIEW COMPARISON:  None. FINDINGS: Interstitial opacities in the right upper lung. Prominence of the  right paratracheal/suprahilar mediastinal contour. No confluent consolidation. No visible pleural effusions or pneumothorax. No evidence of cardiomegaly. No evidence of acute osseous abnormality. IMPRESSION: 1. Interstitial opacities in the right upper lung, which could represent atypical infection or asymmetric edema. 2. Prominence of the right paratracheal/suprahilar mediastinal contour. This could potentially be vascular, but recommend CT of the chest to exclude adenopathy, mass or aneurysm. These results will be called to the ordering clinician or representative by the Radiologist Assistant, and communication documented in the PACS or [employer]. Electronically Signed   By: Margaretha Sheffield M.D.   On: 06/16/2021 08:09  ? ?CT Head Wo Contrast ? ?Result Date: 06/10/2021 ?CLINICAL DATA:  yesterday for a fall, family reports pt fell again this morning around 6a states since the pt has had increased confusion, not making any since, "babbling EXAM: CT HEAD WITHOUT CONTRAST TECHNIQUE: Contiguous axial images were obtained from the base of the  skull through the vertex without intravenous contrast. RADIATION DOSE REDUCTION: This exam was performed according to the departmental dose-optimization program which includes automated exposure control, adjustment of the

## 2021-07-08 ENCOUNTER — Other Ambulatory Visit: Payer: Self-pay | Admitting: *Deleted

## 2021-07-08 DIAGNOSIS — R918 Other nonspecific abnormal finding of lung field: Secondary | ICD-10-CM

## 2021-07-08 DIAGNOSIS — R591 Generalized enlarged lymph nodes: Secondary | ICD-10-CM

## 2021-07-08 NOTE — Progress Notes (Signed)
Pt scheduled for follow up with Dr. Grayland Ormond on 3/30 to discuss PET scan and biopsy planning. Pt to be scheduled for biopsy of the supraclavicular lymph node. Orders placed per VO Dr. Grayland Ormond.  ?

## 2021-07-09 ENCOUNTER — Encounter: Payer: Self-pay | Admitting: Oncology

## 2021-07-09 ENCOUNTER — Inpatient Hospital Stay: Payer: Medicare HMO | Attending: Oncology | Admitting: Oncology

## 2021-07-09 ENCOUNTER — Inpatient Hospital Stay: Payer: Medicare HMO | Admitting: Hospice and Palliative Medicine

## 2021-07-09 ENCOUNTER — Encounter: Payer: Self-pay | Admitting: *Deleted

## 2021-07-09 VITALS — BP 159/81 | HR 79 | Temp 97.9°F | Resp 14 | Wt 129.4 lb

## 2021-07-09 DIAGNOSIS — R918 Other nonspecific abnormal finding of lung field: Secondary | ICD-10-CM | POA: Diagnosis not present

## 2021-07-09 DIAGNOSIS — Z87891 Personal history of nicotine dependence: Secondary | ICD-10-CM | POA: Insufficient documentation

## 2021-07-09 DIAGNOSIS — Z79899 Other long term (current) drug therapy: Secondary | ICD-10-CM | POA: Diagnosis not present

## 2021-07-09 DIAGNOSIS — I700 Atherosclerosis of aorta: Secondary | ICD-10-CM | POA: Insufficient documentation

## 2021-07-09 DIAGNOSIS — E871 Hypo-osmolality and hyponatremia: Secondary | ICD-10-CM | POA: Diagnosis not present

## 2021-07-09 DIAGNOSIS — M4854XA Collapsed vertebra, not elsewhere classified, thoracic region, initial encounter for fracture: Secondary | ICD-10-CM | POA: Insufficient documentation

## 2021-07-09 DIAGNOSIS — R590 Localized enlarged lymph nodes: Secondary | ICD-10-CM | POA: Insufficient documentation

## 2021-07-09 DIAGNOSIS — E119 Type 2 diabetes mellitus without complications: Secondary | ICD-10-CM | POA: Diagnosis not present

## 2021-07-09 DIAGNOSIS — R410 Disorientation, unspecified: Secondary | ICD-10-CM | POA: Diagnosis not present

## 2021-07-09 DIAGNOSIS — G4700 Insomnia, unspecified: Secondary | ICD-10-CM | POA: Insufficient documentation

## 2021-07-09 DIAGNOSIS — J90 Pleural effusion, not elsewhere classified: Secondary | ICD-10-CM | POA: Insufficient documentation

## 2021-07-09 DIAGNOSIS — J8410 Pulmonary fibrosis, unspecified: Secondary | ICD-10-CM | POA: Insufficient documentation

## 2021-07-09 NOTE — Progress Notes (Signed)
Appts for biopsy and follow up with Dr. Grayland Ormond confirmed with pt's daughter, Almyra Free. Instructed to call back with any questions or needs. Almyra Free verbalized understanding.  ?

## 2021-07-09 NOTE — Progress Notes (Signed)
Met with patient and his family during follow up visit with Dr. Grayland Ormond to discuss recent PET scan and biopsy planning. All questions answered during visit. Informed pt that will call with his appt for biopsy and follow up with Dr. Grayland Ormond once scheduled. Contact info given and instructed to call with any questions or needs. Pt and his wife verbalized understanding. ?

## 2021-07-10 NOTE — Progress Notes (Signed)
Patient is on diuretic therapy which alters sodium, potassium and magnesium levels.  Patient is receiving magnesium supplements without follow-up on magnesium levels.  In addition the patient has fatigue.  Patient also had hyponatremia. ? ?Patient required check on all electrolytes. ? ?C. Derrill Kay, MD ?Advanced Bronchoscopy ?P[facility] ? ?

## 2021-07-11 DIAGNOSIS — C3491 Malignant neoplasm of unspecified part of right bronchus or lung: Secondary | ICD-10-CM | POA: Insufficient documentation

## 2021-07-14 ENCOUNTER — Ambulatory Visit: Payer: Medicare HMO | Admitting: Pulmonary Disease

## 2021-07-14 DIAGNOSIS — N182 Chronic kidney disease, stage 2 (mild): Secondary | ICD-10-CM | POA: Diagnosis not present

## 2021-07-14 DIAGNOSIS — N400 Enlarged prostate without lower urinary tract symptoms: Secondary | ICD-10-CM | POA: Diagnosis not present

## 2021-07-14 DIAGNOSIS — H269 Unspecified cataract: Secondary | ICD-10-CM | POA: Diagnosis not present

## 2021-07-14 DIAGNOSIS — E1122 Type 2 diabetes mellitus with diabetic chronic kidney disease: Secondary | ICD-10-CM | POA: Diagnosis not present

## 2021-07-14 DIAGNOSIS — F5101 Primary insomnia: Secondary | ICD-10-CM | POA: Diagnosis not present

## 2021-07-14 DIAGNOSIS — E871 Hypo-osmolality and hyponatremia: Secondary | ICD-10-CM | POA: Diagnosis not present

## 2021-07-14 DIAGNOSIS — I129 Hypertensive chronic kidney disease with stage 1 through stage 4 chronic kidney disease, or unspecified chronic kidney disease: Secondary | ICD-10-CM | POA: Diagnosis not present

## 2021-07-16 ENCOUNTER — Ambulatory Visit
Admission: RE | Admit: 2021-07-16 | Discharge: 2021-07-16 | Disposition: A | Discharge status: Home / Self Care | Payer: Medicare HMO | Source: Ambulatory Visit | Attending: Oncology | Admitting: Oncology

## 2021-07-16 DIAGNOSIS — R918 Other nonspecific abnormal finding of lung field: Secondary | ICD-10-CM | POA: Insufficient documentation

## 2021-07-16 DIAGNOSIS — C770 Secondary and unspecified malignant neoplasm of lymph nodes of head, face and neck: Secondary | ICD-10-CM | POA: Diagnosis not present

## 2021-07-16 DIAGNOSIS — R590 Localized enlarged lymph nodes: Secondary | ICD-10-CM | POA: Insufficient documentation

## 2021-07-16 DIAGNOSIS — R591 Generalized enlarged lymph nodes: Secondary | ICD-10-CM

## 2021-07-16 DIAGNOSIS — C3490 Malignant neoplasm of unspecified part of unspecified bronchus or lung: Secondary | ICD-10-CM | POA: Diagnosis not present

## 2021-07-16 NOTE — Procedures (Signed)
Interventional Radiology Procedure Note ? ?Procedure: US guided biopsy of left supraclavicular pathologic node ?Complications: None ?EBL: None ?Recommendations: ?- DC now ?- Routine wound care ?- Follow up pathology ?  ? ?Signed, ? ?Corrie Mckusick, DO ? ? ?

## 2021-07-20 ENCOUNTER — Other Ambulatory Visit: Payer: Self-pay | Admitting: Pathology

## 2021-07-20 LAB — SURGICAL PATHOLOGY

## 2021-07-21 ENCOUNTER — Encounter: Payer: Self-pay | Admitting: *Deleted

## 2021-07-21 NOTE — Progress Notes (Signed)
Request for [facility] faxed to pathology to send out sample.  ?

## 2021-07-22 ENCOUNTER — Encounter: Payer: Self-pay | Admitting: Emergency Medicine

## 2021-07-22 ENCOUNTER — Other Ambulatory Visit: Payer: Self-pay

## 2021-07-22 DIAGNOSIS — E119 Type 2 diabetes mellitus without complications: Secondary | ICD-10-CM | POA: Insufficient documentation

## 2021-07-22 DIAGNOSIS — M533 Sacrococcygeal disorders, not elsewhere classified: Secondary | ICD-10-CM | POA: Insufficient documentation

## 2021-07-22 DIAGNOSIS — M461 Sacroiliitis, not elsewhere classified: Secondary | ICD-10-CM | POA: Diagnosis not present

## 2021-07-22 DIAGNOSIS — M545 Low back pain, unspecified: Secondary | ICD-10-CM | POA: Diagnosis present

## 2021-07-22 NOTE — ED Triage Notes (Signed)
Pt to ED from home c/o lower back pain today.  States deals with back pain all the time but can correct with changing positions at night but not tonight.  Denies injuries, urinary changes or BM changes.  States pain is when he lies down.  Pt A&Ox4, chest rise even and unlabored, skin WNL, ambulatory with steady gait, in NAD at this time. ?

## 2021-07-23 ENCOUNTER — Inpatient Hospital Stay: Payer: Medicare HMO

## 2021-07-23 ENCOUNTER — Encounter: Payer: Self-pay | Admitting: Oncology

## 2021-07-23 ENCOUNTER — Inpatient Hospital Stay: Payer: Medicare HMO | Attending: Oncology | Admitting: Oncology

## 2021-07-23 ENCOUNTER — Emergency Department
Admission: EM | Admit: 2021-07-23 | Discharge: 2021-07-23 | Disposition: A | Discharge status: Home / Self Care | Payer: Medicare HMO | Attending: Emergency Medicine | Admitting: Emergency Medicine

## 2021-07-23 ENCOUNTER — Encounter: Payer: Self-pay | Admitting: *Deleted

## 2021-07-23 DIAGNOSIS — I700 Atherosclerosis of aorta: Secondary | ICD-10-CM | POA: Diagnosis not present

## 2021-07-23 DIAGNOSIS — G4700 Insomnia, unspecified: Secondary | ICD-10-CM | POA: Diagnosis not present

## 2021-07-23 DIAGNOSIS — C3411 Malignant neoplasm of upper lobe, right bronchus or lung: Secondary | ICD-10-CM | POA: Diagnosis not present

## 2021-07-23 DIAGNOSIS — C3491 Malignant neoplasm of unspecified part of right bronchus or lung: Secondary | ICD-10-CM | POA: Diagnosis not present

## 2021-07-23 DIAGNOSIS — Z79899 Other long term (current) drug therapy: Secondary | ICD-10-CM | POA: Insufficient documentation

## 2021-07-23 DIAGNOSIS — E119 Type 2 diabetes mellitus without complications: Secondary | ICD-10-CM | POA: Insufficient documentation

## 2021-07-23 DIAGNOSIS — E871 Hypo-osmolality and hyponatremia: Secondary | ICD-10-CM | POA: Diagnosis not present

## 2021-07-23 DIAGNOSIS — M533 Sacrococcygeal disorders, not elsewhere classified: Secondary | ICD-10-CM

## 2021-07-23 MED ORDER — PREDNISONE 10 MG (21) PO TBPK: ORAL_TABLET | ORAL | 0 refills | Status: DC

## 2021-07-23 MED ORDER — PREDNISONE 20 MG PO TABS
60.0000 mg | ORAL_TABLET | Freq: Once | ORAL | Status: AC
  Administered 2021-07-23: 60 mg via ORAL
  Filled 2021-07-23: qty 3

## 2021-07-23 MED ORDER — HYDROCODONE-ACETAMINOPHEN 5-325 MG PO TABS: 1.0000 | ORAL_TABLET | ORAL | 0 refills | Status: DC | PRN

## 2021-07-23 MED ORDER — IBUPROFEN 400 MG PO TABS
600.0000 mg | ORAL_TABLET | Freq: Once | ORAL | Status: AC
  Administered 2021-07-23: 600 mg via ORAL
  Filled 2021-07-23: qty 2

## 2021-07-23 MED ORDER — TRAMADOL HCL 50 MG PO TABS
50.0000 mg | ORAL_TABLET | Freq: Once | ORAL | Status: AC
  Administered 2021-07-23: 50 mg via ORAL
  Filled 2021-07-23: qty 1

## 2021-07-23 NOTE — Progress Notes (Signed)
Tumor Board Documentation ? ?John Perez was presented by Dr Grayland Ormond at our Tumor Board on 07/23/2021, which included representatives from medical oncology, radiology, pathology, surgical, pharmacy, pulmonology, genetics, radiation oncology, navigation, research, palliative care, internal medicine. ? ?[name] currently presents as a new patient, for [place], for new positive pathology with history of the following treatments: active survellience. ? ?Additionally, we reviewed previous medical and familial history, history of present illness, and recent lab results along with all available histopathologic and imaging studies. The tumor board considered available treatment options and made the following recommendations: ?Concurrent chemo-radiation therapy ?Awaiting Omniseq testing results ? ?The following procedures/referrals were also placed: No orders of the defined types were placed in this encounter. ? ? ?Clinical Trial Status: not discussed  ? ?Staging used: AJCC Stage Group ?AJCC Staging: ?T: 2 ?N: 3 ?M: 0 ?Group: Stage III B Metastatic Adenocarcinoma of Lung ? ? ?National site-specific guidelines NCCN were discussed with respect to the case. ? ?Tumor board is a meeting of clinicians from various specialty areas who evaluate and discuss patients for whom a multidisciplinary approach is being considered. Final determinations in the plan of care are those of the provider(s). The responsibility for follow up of recommendations given during tumor board is that of the provider.  ? ?Today?s extended care, comprehensive team conference, [place] was not present for the discussion and was not examined.  ? ?Multidisciplinary Tumor Board is a multidisciplinary case peer review process.  Decisions discussed in the Multidisciplinary Tumor Board reflect the opinions of the specialists present at the conference without having examined the patient.  Ultimately, treatment and diagnostic decisions rest with the primary provider(s) and  the patient. ? ?

## 2021-07-23 NOTE — Progress Notes (Signed)
Met with patient during follow up visit with Dr. Grayland Ormond to discuss recent pathology results and treatment options. All questions answered during visit. At this time, pt undecided on treatment and would like a few days to discuss his options with his family. Will follow up with patient at his visit next week. Reviewed upcoming appts. Instructed pt to call with any questions or needs. Pt verbalized understanding.  ?

## 2021-07-23 NOTE — ED Provider Notes (Signed)
? ?[facility] ?Provider Note ? ? Event Date/Time  ? First MD Initiated Contact with Patient 07/23/21 0114   ?  (approximate) ?History  ?Back Pain ? ?HPI ?John Perez is a 81 y.o. male with past medical history of type 2 diabetes and chronic low back pain who presents for bilateral lower back pain that runs across the lower part of his back and has been worsening over the last 24 hours.  Patient states that he deals with this similar back pain chronically however it usually resolves with change in position.  Patient states that since early today, he has been unable to alleviate this pain with a change in position.  Patient denies any weakness/numbness/paresthesias in the lower extremities.  Patient denies any bowel/bladder incontinence.  Patient denies any fevers, night sweats, or unintentional weight loss ?Physical Exam  ?Triage Vital Signs: ?ED Triage Vitals  ?Enc Vitals Group  ?   BP 07/22/21 2212 (!) 170/95  ?   Pulse Rate 07/22/21 2212 82  ?   Resp 07/22/21 2212 18  ?   Temp 07/22/21 2212 97.9 ?F (36.6 ?C)  ?   Temp Source 07/22/21 2212 Oral  ?   SpO2 07/22/21 2212 97 %  ?   Weight 07/22/21 2212 127 lb (57.6 kg)  ?   Height 07/22/21 2212 5\' 10"  (1.778 m)  ?   Head Circumference --   ?   Peak Flow --   ?   Pain Score 07/22/21 2212 10  ?   Pain Loc --   ?   Pain Edu? --   ?   Excl. in [place]? --   ? ?Most recent vital signs: ?Vitals:  ? 07/22/21 2212  ?BP: (!) 170/95  ?Pulse: 82  ?Resp: 18  ?Temp: 97.9 ?F (36.6 ?C)  ?SpO2: 97%  ? ?General: Awake, oriented x4. ?CV:  Good peripheral perfusion.  ?Resp:  Normal effort.  ?Abd:  No distention.  ?Other:  Cachectic elderly male sitting on side of bed in no acute distress.  Tenderness to palpation over bilateral SI joints ?ED Results / Procedures / Treatments  ? ?PROCEDURES: ?Critical Care performed: No ?Procedures ?MEDICATIONS ORDERED IN ED: ?Medications  ?predniSONE (DELTASONE) tablet 60 mg (has no administration in time range)  ?traMADol (ULTRAM)  tablet 50 mg (has no administration in time range)  ?ibuprofen (ADVIL) tablet 600 mg (has no administration in time range)  ? ?IMPRESSION / MDM / ASSESSMENT AND PLAN / ED COURSE  ?I reviewed the triage vital signs and the nursing notes. ?             ?               ? ?Patient presents for low back pain. ?Given History and Exam the patient appears to be at low risk for Spinal Cord Compression Syndrome, Vertebral Malignancy/Mets, acute Spinal Fracture, Vertebral Osteomyelitis, Epidural Abscess, Infected or Obstructing Kidney Stone. ? ?Their presentation appears most likely to be secondary to non-emergent musculoskeletal etiology vs non-emergent disc herniation. ? ?ED Workup: Defer imaging and labwork for outpatient follow up at this time. ? ?Disposition: Discharge. Strict return precautions discussed with patient with full understanding. Advised patient to follow up promptly with primary care provider ? ?  ?FINAL CLINICAL IMPRESSION(S) / ED DIAGNOSES  ? ?Final diagnoses:  ?Sacroiliac joint pain  ? ?Rx / DC Orders  ? ?ED Discharge Orders   ? ?      Ordered  ?  predniSONE (STERAPRED UNI-PAK 21  TAB) 10 MG (21) TBPK tablet       ? 07/23/21 0309  ?  HYDROcodone-acetaminophen (NORCO) 5-325 MG tablet  Every 4 hours PRN       ? 07/23/21 0309  ? ?  ?  ? ?  ? ?Note:  This document was prepared using Dragon voice recognition software and may include unintentional dictation errors. ?  ?Naaman Plummer, MD ?07/23/21 [phone] ? ?

## 2021-07-23 NOTE — Progress Notes (Signed)
?[place]  ?[phone] [phone] ? ?ID: Talmadge Coventry OB: 07-14-1940  MR#: [id number]  KCM#:[id number] ? ?Patient Care Team: ?Derinda Late, MD as PCP - General (Family Medicine) ?Lloyd Huger, MD as Consulting Physician (Oncology) ?Telford Nab, RN as Sales executive ? ?CHIEF COMPLAINT: Stage IIIb adenocarcinoma of the lung. ? ?INTERVAL HISTORY: Patient returns to clinic today for further evaluation, discussion of his biopsy results, and treatment planning.  He currently feels well and is nearly back to his baseline. He has no neurologic complaints.  He does not complain of pain.  He denies any weakness or fatigue today. He denies any recent fevers.  He has a fair appetite, but denies weight loss.  He has no chest pain, shortness of breath, cough, or hemoptysis.  He denies any nausea, vomiting, constipation, or diarrhea.  He has no urinary complaints.  Patient offers no specific complaints today. ? ?REVIEW OF SYSTEMS:   ?Review of Systems  ?Constitutional: Negative.  Negative for fever, malaise/fatigue and weight loss.  ?Respiratory: Negative.  Negative for cough, hemoptysis and shortness of breath.   ?Cardiovascular: Negative.  Negative for chest pain and leg swelling.  ?Gastrointestinal: Negative.  Negative for abdominal pain.  ?Genitourinary: Negative.  Negative for dysuria and hematuria.  ?Musculoskeletal: Negative.  Negative for back pain.  ?Skin: Negative.  Negative for rash.  ?Neurological: Negative.  Negative for dizziness, focal weakness, weakness and headaches.  ?Psychiatric/Behavioral: Negative.  The patient is not nervous/anxious and does not have insomnia.   ? ?As per HPI. Otherwise, a complete review of systems is negative. ? ?PAST MEDICAL HISTORY: ?Past Medical History:  ?Diagnosis Date  ? Diabetes mellitus type 2, insulin dependent ([place])   ? HOH (hard of hearing)   ? Hypercholesteremia   ? Hypertension   ? ? ?PAST SURGICAL HISTORY: ?Past  Surgical History:  ?Procedure Laterality Date  ? CATARACT EXTRACTION W/PHACO Left 02/25/2020  ? Procedure: CATARACT EXTRACTION PHACO AND INTRAOCULAR LENS PLACEMENT ([place]) LEFT DIABETIC;  Surgeon: Eulogio Bear, MD;  Location: [facility];  Service: Ophthalmology;  Laterality: Left;  4.98 ?0:39.4  ? CATARACT EXTRACTION W/PHACO Right 03/17/2020  ? Procedure: CATARACT EXTRACTION PHACO AND INTRAOCULAR LENS PLACEMENT ([place]) RIGHT DIABETIC;  Surgeon: Eulogio Bear, MD;  Location: [facility];  Service: Ophthalmology;  Laterality: Right;  6.33 ?0:55.3  ? GUM SURGERY    ? HERNIA REPAIR    ? x2  ? ? ?FAMILY HISTORY: ?History reviewed. No pertinent family history. ? ?ADVANCED DIRECTIVES (Y/N):  N ? ?HEALTH MAINTENANCE: ?Social History  ? ?Tobacco Use  ? Smoking status: Former  ?  Packs/day: 1.00  ?  Years: 15.00  ?  Pack years: 15.00  ?  Types: Cigarettes  ?  Quit date: 40  ?  Years since quitting: 33.3  ? Smokeless tobacco: Never  ?Vaping Use  ? Vaping Use: Never used  ?Substance Use Topics  ? Alcohol use: Not Currently  ? ? ? Colonoscopy: ? PAP: ? Bone density: ? Lipid panel: ? ?No Known Allergies ? ?Current Outpatient Medications  ?Medication Sig Dispense Refill  ? ASPIRIN 81 PO Take by mouth daily.    ? benazepril (LOTENSIN) 20 MG tablet Take 20 mg by mouth daily.    ? finasteride (PROSCAR) 5 MG tablet Take 5 mg by mouth daily.    ? HYDROcodone-acetaminophen (NORCO) 5-325 MG tablet Take 1 tablet by mouth every 4 (four) hours as needed for severe pain. 12 tablet 0  ? metFORMIN (  GLUCOPHAGE) 1000 MG tablet Take 1 tablet (1,000 mg total) by mouth 2 (two) times daily with a meal. 60 tablet 1  ? PARoxetine (PAXIL) 10 MG tablet Take 10 mg by mouth daily.    ? pravastatin (PRAVACHOL) 20 MG tablet Take 20 mg by mouth daily.    ? tamsulosin (FLOMAX) 0.4 MG CAPS capsule Take 0.4 mg by mouth.    ? furosemide (LASIX) 20 MG tablet Take 0.5 tablets (10 mg total) by mouth daily. (Patient not taking: Reported on  07/09/2021) 30 tablet 0  ? gabapentin (NEURONTIN) 300 MG capsule Take 1 capsule by mouth at bedtime. (Patient not taking: Reported on 07/09/2021)    ? LORazepam (ATIVAN) 0.5 MG tablet Take 0.5 mg by mouth daily as needed. (Patient not taking: Reported on 07/09/2021)    ? magnesium oxide (MAG-OX) 400 (240 Mg) MG tablet Take 1 tablet (400 mg total) by mouth 2 (two) times daily. (Patient not taking: Reported on 07/09/2021) 60 tablet 1  ? magnesium oxide (MAG-OX) 400 MG tablet Take by mouth. (Patient not taking: Reported on 07/09/2021)    ? MELATONIN PO Take by mouth at bedtime as needed. (Patient not taking: Reported on 07/09/2021)    ? Multiple Vitamin (MULTI-VITAMIN) tablet Take 1 tablet by mouth daily. (Patient not taking: Reported on 07/09/2021)    ? Multiple Vitamin (MULTIVITAMIN PO) Take by mouth daily. (Patient not taking: Reported on 07/09/2021)    ? Omega-3 Fatty Acids (FISH OIL PO) Take by mouth daily. (Patient not taking: Reported on 07/09/2021)    ? predniSONE (STERAPRED UNI-PAK 21 TAB) 10 MG (21) TBPK tablet As directed on packaging (Patient not taking: Reported on 07/23/2021) 1 each 0  ? sodium chloride 1 g tablet Take 2 tablets (2 g total) by mouth 2 (two) times daily with a meal. (Patient not taking: Reported on 07/09/2021) 60 tablet 0  ? traZODone (DESYREL) 100 MG tablet Take by mouth. (Patient not taking: Reported on 07/09/2021)    ? ?No current facility-administered medications for this visit.  ? ? ?OBJECTIVE: ?Vitals:  ? 07/23/21 1019  ?BP: (!) 144/89  ?Pulse: (!) 108  ?Resp: 16  ?Temp: (!) 96.4 ?F (35.8 ?C)  ?SpO2: 100%  ?   Body mass index is 18.37 kg/m?[place] Kitchen    ECOG FS:0 - Asymptomatic ? ?General: Thin, no acute distress. ?Eyes: Pink conjunctiva, anicteric sclera. ?HEENT: Normocephalic, moist mucous membranes. ?Lungs: No audible wheezing or coughing. ?Heart: Regular rate and rhythm. ?Abdomen: Soft, nontender, no obvious distention. ?Musculoskeletal: No edema, cyanosis, or clubbing. ?Neuro: Alert, answering all  questions appropriately. Cranial nerves grossly intact. ?Skin: No rashes or petechiae noted. ?Psych: Normal affect. ? ?LAB RESULTS: ? ?Lab Results  ?Component Value Date  ? NA 131 (L) 06/24/2021  ? K 4.1 06/24/2021  ? CL 94 (L) 06/24/2021  ? CO2 27 06/24/2021  ? GLUCOSE 170 (H) 06/24/2021  ? BUN 20 06/24/2021  ? CREATININE 0.86 06/24/2021  ? CALCIUM 9.3 06/24/2021  ? PROT 5.4 (L) 06/16/2021  ? ALBUMIN 3.8 06/24/2021  ? AST 39 06/16/2021  ? ALT 40 06/16/2021  ? ALKPHOS 65 06/16/2021  ? BILITOT 0.9 06/16/2021  ? GFRNONAA >60 06/24/2021  ? ? ?Lab Results  ?Component Value Date  ? WBC 8.7 06/24/2021  ? NEUTROABS 7.1 06/24/2021  ? HGB 13.8 06/24/2021  ? HCT 40.0 06/24/2021  ? MCV 99.8 06/24/2021  ? PLT 297 06/24/2021  ? ? ? ?STUDIES: ?NM PET Image Initial (PI) Skull Base To Thigh ? ?Addendum Date: 07/23/2021   ?  ADDENDUM REPORT: 07/23/2021 13:03 ADDENDUM: The original report was by Dr. Suzy Bouchard. The following addendum is by Dr. Van Clines: This case was discussed in [facility] cancer conference today. Patient was noted to have some accentuated activity along the inferior right scapula along with irregularity in this vicinity and suspected fracture with mild heterotopic calcification in this vicinity. The amount of activity is less than I would expect for a metastatic lesion and the appearance suggests a prior fracture of the scapular tip (potentially related to the patient's fall in late February). This is highly likely to be benign and posttraumatic, although I would suggest attention to this region on any follow up cross-sectional imaging in order to exclude progression. Electronically Signed   By: Van Clines M.D.   On: 07/23/2021 13:03  ? ?Result Date: 07/23/2021 ?CLINICAL DATA:  Initial treatment strategy for pulmonary mass EXAM: NUCLEAR MEDICINE PET SKULL BASE TO THIGH TECHNIQUE: 6.3 mCi F-18 FDG was injected intravenously. Full-ring PET imaging was performed from the skull base to thigh after the  radiotracer. CT data was obtained and used for attenuation correction and anatomic localization. Fasting blood glucose: 270 mg/dl COMPARISON:  Chest CT 06/16/2021 FINDINGS: Mediastinal blood pool activity: SU

## 2021-07-23 NOTE — ED Notes (Signed)
Pt asked if they wanted to have discharge vitals signs taken. Pt declined to have vital signs taken prior to discharge.  ?

## 2021-07-27 DIAGNOSIS — E871 Hypo-osmolality and hyponatremia: Secondary | ICD-10-CM | POA: Diagnosis not present

## 2021-07-27 DIAGNOSIS — Z79899 Other long term (current) drug therapy: Secondary | ICD-10-CM | POA: Diagnosis not present

## 2021-07-28 ENCOUNTER — Ambulatory Visit
Admission: RE | Admit: 2021-07-28 | Discharge: 2021-07-28 | Disposition: A | Discharge status: Home / Self Care | Payer: Medicare HMO | Source: Ambulatory Visit | Attending: Radiation Oncology | Admitting: Radiation Oncology

## 2021-07-28 ENCOUNTER — Encounter: Payer: Self-pay | Admitting: *Deleted

## 2021-07-28 ENCOUNTER — Encounter: Payer: Self-pay | Admitting: Radiation Oncology

## 2021-07-28 VITALS — BP 131/79 | HR 96 | Temp 97.4°F | Resp 18 | Ht 70.0 in | Wt 120.0 lb

## 2021-07-28 DIAGNOSIS — Z87891 Personal history of nicotine dependence: Secondary | ICD-10-CM | POA: Diagnosis not present

## 2021-07-28 DIAGNOSIS — Z79899 Other long term (current) drug therapy: Secondary | ICD-10-CM | POA: Diagnosis not present

## 2021-07-28 DIAGNOSIS — E119 Type 2 diabetes mellitus without complications: Secondary | ICD-10-CM | POA: Diagnosis not present

## 2021-07-28 DIAGNOSIS — R634 Abnormal weight loss: Secondary | ICD-10-CM | POA: Insufficient documentation

## 2021-07-28 DIAGNOSIS — E7800 Pure hypercholesterolemia, unspecified: Secondary | ICD-10-CM | POA: Diagnosis not present

## 2021-07-28 DIAGNOSIS — Z7984 Long term (current) use of oral hypoglycemic drugs: Secondary | ICD-10-CM | POA: Diagnosis not present

## 2021-07-28 DIAGNOSIS — R739 Hyperglycemia, unspecified: Secondary | ICD-10-CM | POA: Diagnosis not present

## 2021-07-28 DIAGNOSIS — Z7982 Long term (current) use of aspirin: Secondary | ICD-10-CM | POA: Insufficient documentation

## 2021-07-28 DIAGNOSIS — C3490 Malignant neoplasm of unspecified part of unspecified bronchus or lung: Secondary | ICD-10-CM | POA: Diagnosis not present

## 2021-07-28 DIAGNOSIS — I10 Essential (primary) hypertension: Secondary | ICD-10-CM | POA: Diagnosis not present

## 2021-07-28 DIAGNOSIS — E871 Hypo-osmolality and hyponatremia: Secondary | ICD-10-CM | POA: Diagnosis not present

## 2021-07-28 DIAGNOSIS — C3411 Malignant neoplasm of upper lobe, right bronchus or lung: Secondary | ICD-10-CM | POA: Insufficient documentation

## 2021-07-28 DIAGNOSIS — Z794 Long term (current) use of insulin: Secondary | ICD-10-CM | POA: Insufficient documentation

## 2021-07-28 DIAGNOSIS — C3491 Malignant neoplasm of unspecified part of right bronchus or lung: Secondary | ICD-10-CM

## 2021-07-28 NOTE — Progress Notes (Signed)
Met with patient during initial consult with Dr. Baruch Gouty. All questions answered during visit. Pt stated that he would like to try concurrent chemo and radiation. Reviewed upcoming appts for chemo education and CT simulation scheduled for Thurs 4/20. Informed pt that he will be scheduled to start chemotherapy once radiation starts. Instructed to call with any further questions or needs. Pt verbalized understanding.  ?

## 2021-07-28 NOTE — Consult Note (Signed)
?NEW PATIENT EVALUATION ? ?Name: John Perez  ?MRN: [id number]  ?Date:   07/28/2021     ?DOB: [birth year] ? ? ?This 81 y.o. male patient presents to the clinic for initial evaluation of stage IIIb (T1 cN3 M0) adenocarcinoma of the right lung. ? ?REFERRING PHYSICIAN: Derinda Late, MD ? ?CHIEF COMPLAINT:  ?Chief Complaint  ?Patient presents with  ? Lung Cancer  ?  Initial consultation  ? ? ?DIAGNOSIS: The encounter diagnosis was Adenocarcinoma, lung, right ([place]). ?  ?PREVIOUS INVESTIGATIONS:  ?CT scans PET CT scans reviewed ?Clinical notes reviewed ?Pathology report reviewed ?Case presented at tumor board ? ?HPI: Patient is an 81 year old male.  Presented to the emergency room with electrolyte imbalance.  Work-up at the time showed prominence of right paratracheal suprahilar mediastinal nodes.  CT scan showed extensive mediastinal hilar and lower cervical adenopathy.  PET/CT CT scan showed hypermetabolic right upper lobe nodule spanning cyst.  Aspect the right oblique fissure consistent with bronchogenic carcinoma measuring approximately 2.5 cm.  There was an adjacent satellite hypermetabolic nodule in the right upper lobe.  Patient also had extensive hypermetabolic right hilar and ipsilateral contralateral mediastinal nodes.  Also had bilateral cervical and supraclavicular adenopathy.  He had a supraclavicular nodal biopsy which was positive for adenocarcinoma consistent with lung origin.  Patient is somewhat frail has lost weight is in no specific pain at this time has no cough hemoptysis or chest tightness.  He was presented at our tumor board and recommendation was from concurrent chemoradiation.  He does have a hypermetabolic lesion in his scapula with some partial fracture which radiology feels is secondary to trauma and he does have a history of that. ? ?PLANNED TREATMENT REGIMEN: IMRT radiation therapy with concurrent chemotherapy ? ?PAST MEDICAL HISTORY:  has a past medical history of Diabetes mellitus type  2, insulin dependent ([place]), HOH (hard of hearing), Hypercholesteremia, and Hypertension.   ? ?PAST SURGICAL HISTORY:  ?Past Surgical History:  ?Procedure Laterality Date  ? CATARACT EXTRACTION W/PHACO Left 02/25/2020  ? Procedure: CATARACT EXTRACTION PHACO AND INTRAOCULAR LENS PLACEMENT ([place]) LEFT DIABETIC;  Surgeon: Eulogio Bear, MD;  Location: [facility];  Service: Ophthalmology;  Laterality: Left;  4.98 ?0:39.4  ? CATARACT EXTRACTION W/PHACO Right 03/17/2020  ? Procedure: CATARACT EXTRACTION PHACO AND INTRAOCULAR LENS PLACEMENT ([place]) RIGHT DIABETIC;  Surgeon: Eulogio Bear, MD;  Location: [place];  Service: Ophthalmology;  Laterality: Right;  6.33 ?0:55.3  ? GUM SURGERY    ? HERNIA REPAIR    ? x2  ? ? ?FAMILY HISTORY: family history is not on file. ? ?SOCIAL HISTORY:  reports that he quit smoking about 33 years ago. His smoking use included cigarettes. He has a 15.00 pack-year smoking history. He has never used smokeless tobacco. He reports that he does not currently use alcohol. ? ?ALLERGIES: Patient has no known allergies. ? ?MEDICATIONS:  ?Current Outpatient Medications  ?Medication Sig Dispense Refill  ? ASPIRIN 81 PO Take by mouth daily.    ? benazepril (LOTENSIN) 20 MG tablet Take 20 mg by mouth daily.    ? finasteride (PROSCAR) 5 MG tablet Take 5 mg by mouth daily.    ? HYDROcodone-acetaminophen (NORCO) 5-325 MG tablet Take 1 tablet by mouth every 4 (four) hours as needed for severe pain. 12 tablet 0  ? metFORMIN (GLUCOPHAGE) 1000 MG tablet Take 1 tablet (1,000 mg total) by mouth 2 (two) times daily with a meal. 60 tablet 1  ? mirtazapine (REMERON) 15 MG tablet Take 15  mg by mouth at bedtime.    ? PARoxetine (PAXIL) 10 MG tablet Take 10 mg by mouth daily.    ? pravastatin (PRAVACHOL) 20 MG tablet Take 20 mg by mouth daily.    ? tamsulosin (FLOMAX) 0.4 MG CAPS capsule Take 0.4 mg by mouth.    ? furosemide (LASIX) 20 MG tablet Take 0.5 tablets (10 mg total) by mouth daily.  (Patient not taking: Reported on 07/09/2021) 30 tablet 0  ? gabapentin (NEURONTIN) 300 MG capsule Take 1 capsule by mouth at bedtime. (Patient not taking: Reported on 07/09/2021)    ? LORazepam (ATIVAN) 0.5 MG tablet Take 0.5 mg by mouth daily as needed. (Patient not taking: Reported on 07/09/2021)    ? magnesium oxide (MAG-OX) 400 (240 Mg) MG tablet Take 1 tablet (400 mg total) by mouth 2 (two) times daily. (Patient not taking: Reported on 07/09/2021) 60 tablet 1  ? magnesium oxide (MAG-OX) 400 MG tablet Take by mouth. (Patient not taking: Reported on 07/09/2021)    ? MELATONIN PO Take by mouth at bedtime as needed. (Patient not taking: Reported on 07/09/2021)    ? Multiple Vitamin (MULTI-VITAMIN) tablet Take 1 tablet by mouth daily. (Patient not taking: Reported on 07/09/2021)    ? Multiple Vitamin (MULTIVITAMIN PO) Take by mouth daily. (Patient not taking: Reported on 07/09/2021)    ? Omega-3 Fatty Acids (FISH OIL PO) Take by mouth daily. (Patient not taking: Reported on 07/09/2021)    ? predniSONE (STERAPRED UNI-PAK 21 TAB) 10 MG (21) TBPK tablet As directed on packaging (Patient not taking: Reported on 07/23/2021) 1 each 0  ? sodium chloride 1 g tablet Take 2 tablets (2 g total) by mouth 2 (two) times daily with a meal. (Patient not taking: Reported on 07/09/2021) 60 tablet 0  ? traZODone (DESYREL) 100 MG tablet Take by mouth. (Patient not taking: Reported on 07/09/2021)    ? ?No current facility-administered medications for this encounter.  ? ? ?ECOG PERFORMANCE STATUS:  0 - Asymptomatic ? ?REVIEW OF SYSTEMS: ?Patient denies any weight loss, fatigue, weakness, fever, chills or night sweats. Patient denies any loss of vision, blurred vision. Patient denies any ringing  of the ears or hearing loss. No irregular heartbeat. Patient denies heart murmur or history of fainting. Patient denies any chest pain or pain radiating to her upper extremities. Patient denies any shortness of breath, difficulty breathing at night, cough or  hemoptysis. Patient denies any swelling in the lower legs. Patient denies any nausea vomiting, vomiting of blood, or coffee ground material in the vomitus. Patient denies any stomach pain. Patient states has had normal bowel movements no significant constipation or diarrhea. Patient denies any dysuria, hematuria or significant nocturia. Patient denies any problems walking, swelling in the joints or loss of balance. Patient denies any skin changes, loss of hair or loss of weight. Patient denies any excessive worrying or anxiety or significant depression. Patient denies any problems with insomnia. Patient denies excessive thirst, polyuria, polydipsia. Patient denies any swollen glands, patient denies easy bruising or easy bleeding. Patient denies any recent infections, allergies or URI. Patient "s visual fields have not changed significantly in recent time. ?  ?PHYSICAL EXAM: ?BP 131/79   Pulse 96   Temp (!) 97.4 ?F (36.3 ?C)   Resp 18   Ht 5\' 10"  (1.778 m)   Wt 120 lb (54.4 kg)   BMI 17.22 kg/m?  ?Frail-appearing male in NAD anorexic well-developed well-nourished patient in NAD. HEENT reveals PERLA, EOMI, discs not visualized.  Oral cavity is clear. No oral mucosal lesions are identified. Neck is clear without evidence of cervical or supraclavicular adenopathy. Lungs are clear to A&P. Cardiac examination is essentially unremarkable with regular rate and rhythm without murmur rub or thrill. Abdomen is benign with no organomegaly or masses noted. Motor sensory and DTR levels are equal and symmetric in the upper and lower extremities. Cranial nerves II through XII are grossly intact. Proprioception is intact. No peripheral adenopathy or edema is identified. No motor or sensory levels are noted. Crude visual fields are within normal range. ? ?LABORATORY DATA: Pathology report reviewed ? ?  ?RADIOLOGY RESULTS: CT scans PET CT scans MRI of brain reviewed.  MRI of brain shows no evidence to suggest metastatic  disease ? ? ?IMPRESSION: Stage IIIb adenocarcinoma of the right lung in 81 year old male ? ?PLAN: At this time elect to go with concurrent chemoradiation therapy.  Would use IMRT treatment planning delivery to treat th

## 2021-07-30 ENCOUNTER — Inpatient Hospital Stay: Payer: Medicare HMO

## 2021-07-30 ENCOUNTER — Other Ambulatory Visit: Payer: Self-pay | Admitting: *Deleted

## 2021-07-30 ENCOUNTER — Telehealth: Payer: Self-pay | Admitting: Oncology

## 2021-07-30 ENCOUNTER — Encounter: Payer: Self-pay | Admitting: *Deleted

## 2021-07-30 ENCOUNTER — Ambulatory Visit: Admission: RE | Admit: 2021-07-30 | Payer: Medicare HMO | Source: Ambulatory Visit

## 2021-07-30 DIAGNOSIS — C3411 Malignant neoplasm of upper lobe, right bronchus or lung: Secondary | ICD-10-CM | POA: Diagnosis not present

## 2021-07-30 DIAGNOSIS — C3491 Malignant neoplasm of unspecified part of right bronchus or lung: Secondary | ICD-10-CM | POA: Diagnosis not present

## 2021-07-30 DIAGNOSIS — Z87891 Personal history of nicotine dependence: Secondary | ICD-10-CM | POA: Diagnosis not present

## 2021-07-30 NOTE — Telephone Encounter (Signed)
Left Vm with patient on his home and mobile device to review appointment details with him (starting chemo on 5/3). Requested he call back and confirm.  ?

## 2021-07-31 ENCOUNTER — Other Ambulatory Visit: Payer: Self-pay | Admitting: Oncology

## 2021-07-31 DIAGNOSIS — C3491 Malignant neoplasm of unspecified part of right bronchus or lung: Secondary | ICD-10-CM

## 2021-07-31 MED ORDER — PROCHLORPERAZINE MALEATE 10 MG PO TABS: 10.0000 mg | ORAL_TABLET | Freq: Four times a day (QID) | ORAL | 2 refills | Status: DC | PRN

## 2021-07-31 MED ORDER — ONDANSETRON HCL 8 MG PO TABS: 8.0000 mg | ORAL_TABLET | Freq: Two times a day (BID) | ORAL | 2 refills | Status: DC | PRN

## 2021-07-31 NOTE — Progress Notes (Signed)
START ON PATHWAY REGIMEN - Non-Small Cell Lung ? ? ?  Administer weekly: ?    Paclitaxel  ?    Carboplatin  ? ?**Always confirm dose/schedule in your pharmacy ordering system** ? ?Patient Characteristics: ?Preoperative or Nonsurgical Candidate (Clinical Staging), Stage III - Nonsurgical Candidate (Nonsquamous and Squamous), PS = 0, 1 ?Therapeutic Status: Preoperative or Nonsurgical Candidate (Clinical Staging) ?AJCC T Category: cT1c ?AJCC N Category: cN3 ?AJCC M Category: cM0 ?AJCC 8 Stage Grouping: IIIB ?ECOG Performance Status: 0 ?Intent of Therapy: ?Curative Intent, Discussed with Patient ?

## 2021-08-04 ENCOUNTER — Encounter: Payer: Self-pay | Admitting: Oncology

## 2021-08-04 ENCOUNTER — Other Ambulatory Visit: Payer: Self-pay | Admitting: *Deleted

## 2021-08-04 MED ORDER — TRAMADOL HCL 50 MG PO TABS
50.0000 mg | ORAL_TABLET | Freq: Four times a day (QID) | ORAL | 0 refills | Status: DC | PRN
Start: 2021-08-04 — End: 2021-08-07

## 2021-08-05 DIAGNOSIS — Z87891 Personal history of nicotine dependence: Secondary | ICD-10-CM | POA: Diagnosis not present

## 2021-08-05 DIAGNOSIS — C3491 Malignant neoplasm of unspecified part of right bronchus or lung: Secondary | ICD-10-CM | POA: Diagnosis not present

## 2021-08-05 DIAGNOSIS — C3411 Malignant neoplasm of upper lobe, right bronchus or lung: Secondary | ICD-10-CM | POA: Diagnosis not present

## 2021-08-06 ENCOUNTER — Telehealth: Payer: Self-pay | Admitting: *Deleted

## 2021-08-06 NOTE — Telephone Encounter (Signed)
Pt continues to have right sided lower back pain that is not relieved by tramadol 50mg . Instructed pt's duaghter that he can increase to taking 2 tramadol tablets every 6 hours and if pain is still not relieved then needs to try hydrocodone tablets. Instructed to call back if pain is not relieved or continues to worsen. Pt's daughter verbalized understanding. ?

## 2021-08-06 NOTE — Progress Notes (Signed)
Pharmacist Chemotherapy Monitoring - Initial Assessment   ? ?Anticipated start date: 08/13/21  ? ?The following has been reviewed per standard work regarding the patient's treatment regimen: ?The patient's diagnosis, treatment plan and drug doses, and organ/hematologic function ?Lab orders and baseline tests specific to treatment regimen  ?The treatment plan start date, drug sequencing, and pre-medications ?Prior authorization status  ?Patient's documented medication list, including drug-drug interaction screen and prescriptions for anti-emetics and supportive care specific to the treatment regimen ?The drug concentrations, fluid compatibility, administration routes, and timing of the medications to be used ?The patient's access for treatment and lifetime cumulative dose history, if applicable  ?The patient's medication allergies and previous infusion related reactions, if applicable  ? ?Changes made to treatment plan:  ?N/A ? ?Follow up needed:  ?dose adjustment of carbo due to no dose entered and signing treatment plan ? ? ?Adelina Mings, [facility], ?08/06/2021  10:11 AM  ?

## 2021-08-07 ENCOUNTER — Other Ambulatory Visit: Payer: Self-pay | Admitting: *Deleted

## 2021-08-07 MED ORDER — OXYCODONE HCL 10 MG PO TABS: 10.0000 mg | ORAL_TABLET | Freq: Four times a day (QID) | ORAL | 0 refills | Status: AC | PRN

## 2021-08-07 NOTE — Progress Notes (Deleted)
[place]  [phone] [phone]) [phone]  ID: John Perez OB: 1940-06-19  MR#: [id number]  GUY#:[id number]  Patient Care Team: Derinda Late, MD as PCP - General (Family Medicine) Lloyd Huger, MD as Consulting Physician (Oncology) Telford Nab, RN as Oncology Nurse Navigator  CHIEF COMPLAINT: Stage IIIb adenocarcinoma of the lung.  INTERVAL HISTORY: Patient returns to clinic today for further evaluation, discussion of his biopsy results, and treatment planning.  He currently feels well and is nearly back to his baseline. He has no neurologic complaints.  He does not complain of pain.  He denies any weakness or fatigue today. He denies any recent fevers.  He has a fair appetite, but denies weight loss.  He has no chest pain, shortness of breath, cough, or hemoptysis.  He denies any nausea, vomiting, constipation, or diarrhea.  He has no urinary complaints.  Patient offers no specific complaints today.  REVIEW OF SYSTEMS:   Review of Systems  Constitutional: Negative.  Negative for fever, malaise/fatigue and weight loss.  Respiratory: Negative.  Negative for cough, hemoptysis and shortness of breath.   Cardiovascular: Negative.  Negative for chest pain and leg swelling.  Gastrointestinal: Negative.  Negative for abdominal pain.  Genitourinary: Negative.  Negative for dysuria and hematuria.  Musculoskeletal: Negative.  Negative for back pain.  Skin: Negative.  Negative for rash.  Neurological: Negative.  Negative for dizziness, focal weakness, weakness and headaches.  Psychiatric/Behavioral: Negative.  The patient is not nervous/anxious and does not have insomnia.    As per HPI. Otherwise, a complete review of systems is negative.  PAST MEDICAL HISTORY: Past Medical History:  Diagnosis Date   Diabetes mellitus type 2, insulin dependent ([place])    HOH (hard of hearing)    Hypercholesteremia    Hypertension     PAST SURGICAL HISTORY: Past  Surgical History:  Procedure Laterality Date   CATARACT EXTRACTION W/PHACO Left 02/25/2020   Procedure: CATARACT EXTRACTION PHACO AND INTRAOCULAR LENS PLACEMENT ([facility]) LEFT DIABETIC;  Surgeon: Eulogio Bear, MD;  Location: [facility];  Service: Ophthalmology;  Laterality: Left;  4.98 0:39.4   CATARACT EXTRACTION W/PHACO Right 03/17/2020   Procedure: CATARACT EXTRACTION PHACO AND INTRAOCULAR LENS PLACEMENT ([place]) RIGHT DIABETIC;  Surgeon: Eulogio Bear, MD;  Location: [facility];  Service: Ophthalmology;  Laterality: Right;  6.33 0:55.3   GUM SURGERY     HERNIA REPAIR     x2    FAMILY HISTORY: No family history on file.  ADVANCED DIRECTIVES (Y/N):  N  HEALTH MAINTENANCE: Social History   Tobacco Use   Smoking status: Former    Packs/day: 1.00    Years: 15.00    Pack years: 15.00    Types: Cigarettes    Quit date: 1990    Years since quitting: 33.3   Smokeless tobacco: Never  Vaping Use   Vaping Use: Never used  Substance Use Topics   Alcohol use: Not Currently     Colonoscopy:  PAP:  Bone density:  Lipid panel:  No Known Allergies  Current Outpatient Medications  Medication Sig Dispense Refill   ASPIRIN 81 PO Take by mouth daily.     benazepril (LOTENSIN) 20 MG tablet Take 20 mg by mouth daily.     finasteride (PROSCAR) 5 MG tablet Take 5 mg by mouth daily.     furosemide (LASIX) 20 MG tablet Take 0.5 tablets (10 mg total) by mouth daily. (Patient not taking: Reported on 07/09/2021) 30 tablet 0   gabapentin (  NEURONTIN) 300 MG capsule Take 1 capsule by mouth at bedtime. (Patient not taking: Reported on 07/09/2021)     LORazepam (ATIVAN) 0.5 MG tablet Take 0.5 mg by mouth daily as needed. (Patient not taking: Reported on 07/09/2021)     magnesium oxide (MAG-OX) 400 (240 Mg) MG tablet Take 1 tablet (400 mg total) by mouth 2 (two) times daily. (Patient not taking: Reported on 07/09/2021) 60 tablet 1   magnesium oxide (MAG-OX) 400 MG tablet Take by  mouth. (Patient not taking: Reported on 07/09/2021)     MELATONIN PO Take by mouth at bedtime as needed. (Patient not taking: Reported on 07/09/2021)     metFORMIN (GLUCOPHAGE) 1000 MG tablet Take 1 tablet (1,000 mg total) by mouth 2 (two) times daily with a meal. 60 tablet 1   mirtazapine (REMERON) 15 MG tablet Take 15 mg by mouth at bedtime.     Multiple Vitamin (MULTI-VITAMIN) tablet Take 1 tablet by mouth daily. (Patient not taking: Reported on 07/09/2021)     Multiple Vitamin (MULTIVITAMIN PO) Take by mouth daily. (Patient not taking: Reported on 07/09/2021)     Omega-3 Fatty Acids (FISH OIL PO) Take by mouth daily. (Patient not taking: Reported on 07/09/2021)     ondansetron (ZOFRAN) 8 MG tablet Take 1 tablet (8 mg total) by mouth 2 (two) times daily as needed for refractory nausea / vomiting. 60 tablet 2   Oxycodone HCl 10 MG TABS Take 1 tablet (10 mg total) by mouth every 6 (six) hours as needed. 60 tablet 0   PARoxetine (PAXIL) 10 MG tablet Take 10 mg by mouth daily.     pravastatin (PRAVACHOL) 20 MG tablet Take 20 mg by mouth daily.     predniSONE (STERAPRED UNI-PAK 21 TAB) 10 MG (21) TBPK tablet As directed on packaging (Patient not taking: Reported on 07/23/2021) 1 each 0   prochlorperazine (COMPAZINE) 10 MG tablet Take 1 tablet (10 mg total) by mouth every 6 (six) hours as needed (Nausea or vomiting). 60 tablet 2   sodium chloride 1 g tablet Take 2 tablets (2 g total) by mouth 2 (two) times daily with a meal. (Patient not taking: Reported on 07/09/2021) 60 tablet 0   tamsulosin (FLOMAX) 0.4 MG CAPS capsule Take 0.4 mg by mouth.     No current facility-administered medications for this visit.    OBJECTIVE: There were no vitals filed for this visit.    There is no height or weight on file to calculate BMI.    ECOG FS:0 - Asymptomatic  General: Thin, no acute distress. Eyes: Pink conjunctiva, anicteric sclera. HEENT: Normocephalic, moist mucous membranes. Lungs: No audible wheezing or  coughing. Heart: Regular rate and rhythm. Abdomen: Soft, nontender, no obvious distention. Musculoskeletal: No edema, cyanosis, or clubbing. Neuro: Alert, answering all questions appropriately. Cranial nerves grossly intact. Skin: No rashes or petechiae noted. Psych: Normal affect.  LAB RESULTS:  Lab Results  Component Value Date   NA 131 (L) 06/24/2021   K 4.1 06/24/2021   CL 94 (L) 06/24/2021   CO2 27 06/24/2021   GLUCOSE 170 (H) 06/24/2021   BUN 20 06/24/2021   CREATININE 0.86 06/24/2021   CALCIUM 9.3 06/24/2021   PROT 5.4 (L) 06/16/2021   ALBUMIN 3.8 06/24/2021   AST 39 06/16/2021   ALT 40 06/16/2021   ALKPHOS 65 06/16/2021   BILITOT 0.9 06/16/2021   GFRNONAA >60 06/24/2021    Lab Results  Component Value Date   WBC 8.7 06/24/2021   NEUTROABS 7.1  06/24/2021   HGB 13.8 06/24/2021   HCT 40.0 06/24/2021   MCV 99.8 06/24/2021   PLT 297 06/24/2021     STUDIES: [place] CORE BIOPSY (LYMPH NODES)  Result Date: 07/16/2021 INDICATION: 81 year old male referred for biopsy of pathologic supraclavicular lymphadenopathy EXAM: ULTRASOUND-GUIDED LYMPH NODE BIOPSY MEDICATIONS: None. ANESTHESIA/SEDATION: None COMPLICATIONS: None PROCEDURE: Informed written consent was obtained from the patient after a thorough discussion of the procedural risks, benefits and alternatives. All questions were addressed. Maximal Sterile Barrier Technique was utilized including caps, mask, sterile gowns, sterile gloves, sterile drape, hand hygiene and skin antiseptic. A timeout was performed prior to the initiation of the procedure. Ultrasound survey was performed with images stored and sent to PACs. The left neck was prepped with chlorhexidine in a sterile fashion, and a sterile drape was applied covering the operative field. A sterile gown and sterile gloves were used for the procedure. Local anesthesia was provided with 1% Lidocaine. Ultrasound guidance was used to infiltrate the region with 1% lidocaine for  local anesthesia. Multiple separate 18 gauge core biopsy were then acquired of the left supraclavicular pathologic nodule using ultrasound guidance. Images were stored. Final image was stored after biopsy. Patient tolerated the procedure well and remained hemodynamically stable throughout. No complications were encountered and no significant blood loss was encounter IMPRESSION: Status post ultrasound-guided core biopsy of left supraclavicular pathologic lymphadenopathy. Signed, Dulcy Fanny. Dellia Nims, RPVI [facility] Radiology Electronically Signed   By: Corrie Mckusick D.O.   On: 07/16/2021 15:01    ASSESSMENT: Stage IIIb adenocarcinoma of the lung.  PLAN:    1.  Stage IIIb adenocarcinoma of the lung: Biopsy and PET scan confirms the staging diagnosis.  MRI of the brain on June 13, 2021 did not reveal any metastatic disease. Case was discussed at tumor board and thought his scapular lesion is not a metastasis, but a possible old healing fracture.  Case also discussed with radiation oncology who agrees to concurrent chemotherapy with weekly carboplatinum and Taxol along with daily XRT would be patient's best treatment option.  This will be followed by maintenance durvalumab every 2 weeks for an entire year.  After lengthy discussion with the patient, he is unclear if he wishes to pursue any treatment at all at this time, but is not yet ready for hospice.  He plans to further discuss this over with his wife and 10 children and then will call clinic with his final decision.  No follow-up has been made at this time.   2.  Insomnia: Patient does not complain of this today.  Continue evaluation and treatment per primary care.   3.  Hyponatremia: Possibly related to underlying malignancy.  Patient's most recent sodium was 131.  Monitor.  I spent a total of 30 minutes reviewing chart data, face-to-face evaluation with the patient, counseling and coordination of care as  detailed above.   Patient expressed understanding and was in agreement with this plan. He also understands that He can call clinic at any time with any questions, concerns, or complaints.    Cancer Staging  Adenocarcinoma, lung, right [facility]) Staging form: Lung, AJCC 8th Edition - Clinical stage from 07/23/2021: Stage IIIB (cT1c, cN3, cM0) - Signed by Lloyd Huger, MD on 07/23/2021 Stage prefix: Initial diagnosis   Lloyd Huger, MD   08/07/2021 4:00 PM

## 2021-08-10 ENCOUNTER — Inpatient Hospital Stay: Payer: Medicare HMO | Attending: Oncology

## 2021-08-10 ENCOUNTER — Ambulatory Visit: Admission: RE | Admit: 2021-08-10 | Payer: Medicare HMO | Source: Ambulatory Visit

## 2021-08-10 DIAGNOSIS — C7951 Secondary malignant neoplasm of bone: Secondary | ICD-10-CM | POA: Diagnosis not present

## 2021-08-10 DIAGNOSIS — C3491 Malignant neoplasm of unspecified part of right bronchus or lung: Secondary | ICD-10-CM | POA: Diagnosis not present

## 2021-08-10 DIAGNOSIS — E871 Hypo-osmolality and hyponatremia: Secondary | ICD-10-CM | POA: Diagnosis not present

## 2021-08-10 DIAGNOSIS — G939 Disorder of brain, unspecified: Secondary | ICD-10-CM | POA: Insufficient documentation

## 2021-08-10 DIAGNOSIS — Z79899 Other long term (current) drug therapy: Secondary | ICD-10-CM | POA: Insufficient documentation

## 2021-08-10 DIAGNOSIS — Z8673 Personal history of transient ischemic attack (TIA), and cerebral infarction without residual deficits: Secondary | ICD-10-CM | POA: Diagnosis not present

## 2021-08-10 DIAGNOSIS — Z794 Long term (current) use of insulin: Secondary | ICD-10-CM | POA: Diagnosis not present

## 2021-08-10 DIAGNOSIS — D72829 Elevated white blood cell count, unspecified: Secondary | ICD-10-CM | POA: Diagnosis not present

## 2021-08-10 DIAGNOSIS — R0602 Shortness of breath: Secondary | ICD-10-CM | POA: Diagnosis not present

## 2021-08-10 DIAGNOSIS — J90 Pleural effusion, not elsewhere classified: Secondary | ICD-10-CM | POA: Diagnosis not present

## 2021-08-10 DIAGNOSIS — E876 Hypokalemia: Secondary | ICD-10-CM | POA: Insufficient documentation

## 2021-08-10 DIAGNOSIS — I083 Combined rheumatic disorders of mitral, aortic and tricuspid valves: Secondary | ICD-10-CM | POA: Insufficient documentation

## 2021-08-10 DIAGNOSIS — Z87891 Personal history of nicotine dependence: Secondary | ICD-10-CM | POA: Insufficient documentation

## 2021-08-10 DIAGNOSIS — R531 Weakness: Secondary | ICD-10-CM | POA: Diagnosis not present

## 2021-08-10 DIAGNOSIS — E119 Type 2 diabetes mellitus without complications: Secondary | ICD-10-CM | POA: Insufficient documentation

## 2021-08-10 DIAGNOSIS — I3139 Other pericardial effusion (noninflammatory): Secondary | ICD-10-CM | POA: Diagnosis not present

## 2021-08-10 DIAGNOSIS — M47812 Spondylosis without myelopathy or radiculopathy, cervical region: Secondary | ICD-10-CM | POA: Insufficient documentation

## 2021-08-10 DIAGNOSIS — I672 Cerebral atherosclerosis: Secondary | ICD-10-CM | POA: Diagnosis not present

## 2021-08-10 DIAGNOSIS — M954 Acquired deformity of chest and rib: Secondary | ICD-10-CM | POA: Diagnosis not present

## 2021-08-10 DIAGNOSIS — I6523 Occlusion and stenosis of bilateral carotid arteries: Secondary | ICD-10-CM | POA: Insufficient documentation

## 2021-08-10 DIAGNOSIS — R5383 Other fatigue: Secondary | ICD-10-CM | POA: Insufficient documentation

## 2021-08-10 DIAGNOSIS — M4854XA Collapsed vertebra, not elsewhere classified, thoracic region, initial encounter for fracture: Secondary | ICD-10-CM | POA: Diagnosis not present

## 2021-08-10 DIAGNOSIS — J984 Other disorders of lung: Secondary | ICD-10-CM | POA: Insufficient documentation

## 2021-08-10 DIAGNOSIS — C3411 Malignant neoplasm of upper lobe, right bronchus or lung: Secondary | ICD-10-CM | POA: Diagnosis present

## 2021-08-10 LAB — COMPREHENSIVE METABOLIC PANEL
ALT: 24 U/L (ref 0–44)
AST: 33 U/L (ref 15–41)
Albumin: 3 g/dL — ABNORMAL LOW (ref 3.5–5.0)
Alkaline Phosphatase: 192 U/L — ABNORMAL HIGH (ref 38–126)
Anion gap: 12 (ref 5–15)
BUN: 16 mg/dL (ref 8–23)
CO2: 26 mmol/L (ref 22–32)
Calcium: 8.4 mg/dL — ABNORMAL LOW (ref 8.9–10.3)
Chloride: 81 mmol/L — ABNORMAL LOW (ref 98–111)
Creatinine, Ser: 0.57 mg/dL — ABNORMAL LOW (ref 0.61–1.24)
GFR, Estimated: >60 mL/min (ref >60)
Glucose, Bld: 197 mg/dL — ABNORMAL HIGH (ref 70–99)
Potassium: 4.1 mmol/L (ref 3.5–5.1)
Sodium: 119 mmol/L — CL (ref 135–145)
Total Bilirubin: 1.4 mg/dL — ABNORMAL HIGH (ref 0.3–1.2)
Total Protein: 6.3 g/dL — ABNORMAL LOW (ref 6.5–8.1)

## 2021-08-10 LAB — CBC WITH DIFFERENTIAL/PLATELET
Abs Immature Granulocytes: 0.22 10*3/uL — ABNORMAL HIGH (ref 0.00–0.07)
Basophils Absolute: 0 10*3/uL (ref 0.0–0.1)
Basophils Relative: 0 %
Eosinophils Absolute: 0 10*3/uL (ref 0.0–0.5)
Eosinophils Relative: 0 %
HCT: 33.3 % — ABNORMAL LOW (ref 39.0–52.0)
Hemoglobin: 12.2 g/dL — ABNORMAL LOW (ref 13.0–17.0)
Immature Granulocytes: 2 %
Lymphocytes Relative: 6 %
Lymphs Abs: 0.9 10*3/uL (ref 0.7–4.0)
MCH: 34.7 pg — ABNORMAL HIGH (ref 26.0–34.0)
MCHC: 36.6 g/dL — ABNORMAL HIGH (ref 30.0–36.0)
MCV: 94.6 fL (ref 80.0–100.0)
Monocytes Absolute: 1.2 10*3/uL — ABNORMAL HIGH (ref 0.1–1.0)
Monocytes Relative: 9 %
Neutro Abs: 11.2 10*3/uL — ABNORMAL HIGH (ref 1.7–7.7)
Neutrophils Relative %: 83 %
Platelets: 239 10*3/uL (ref 150–400)
RBC: 3.52 MIL/uL — ABNORMAL LOW (ref 4.22–5.81)
RDW: 12.2 % (ref 11.5–15.5)
WBC: 13.4 10*3/uL — ABNORMAL HIGH (ref 4.0–10.5)
nRBC: 0 % (ref 0.0–0.2)

## 2021-08-11 ENCOUNTER — Inpatient Hospital Stay
Admission: EM | Admit: 2021-08-11 | Discharge: 2021-08-18 | DRG: 064 | Disposition: A | Discharge status: Home Health | Payer: Medicare HMO | Attending: Internal Medicine | Admitting: Internal Medicine

## 2021-08-11 ENCOUNTER — Other Ambulatory Visit: Payer: Self-pay | Admitting: Oncology

## 2021-08-11 ENCOUNTER — Emergency Department: Payer: Medicare HMO

## 2021-08-11 ENCOUNTER — Ambulatory Visit: Payer: Medicare HMO

## 2021-08-11 ENCOUNTER — Inpatient Hospital Stay: Payer: Medicare HMO

## 2021-08-11 ENCOUNTER — Telehealth: Payer: Self-pay | Admitting: *Deleted

## 2021-08-11 ENCOUNTER — Other Ambulatory Visit: Payer: Self-pay

## 2021-08-11 DIAGNOSIS — I6621 Occlusion and stenosis of right posterior cerebral artery: Secondary | ICD-10-CM | POA: Diagnosis not present

## 2021-08-11 DIAGNOSIS — C3401 Malignant neoplasm of right main bronchus: Secondary | ICD-10-CM | POA: Diagnosis present

## 2021-08-11 DIAGNOSIS — C3490 Malignant neoplasm of unspecified part of unspecified bronchus or lung: Secondary | ICD-10-CM

## 2021-08-11 DIAGNOSIS — Z7984 Long term (current) use of oral hypoglycemic drugs: Secondary | ICD-10-CM | POA: Diagnosis not present

## 2021-08-11 DIAGNOSIS — G936 Cerebral edema: Secondary | ICD-10-CM | POA: Diagnosis not present

## 2021-08-11 DIAGNOSIS — Z66 Do not resuscitate: Secondary | ICD-10-CM | POA: Diagnosis present

## 2021-08-11 DIAGNOSIS — E1165 Type 2 diabetes mellitus with hyperglycemia: Secondary | ICD-10-CM | POA: Diagnosis present

## 2021-08-11 DIAGNOSIS — E876 Hypokalemia: Secondary | ICD-10-CM | POA: Diagnosis not present

## 2021-08-11 DIAGNOSIS — I10 Essential (primary) hypertension: Secondary | ICD-10-CM | POA: Diagnosis not present

## 2021-08-11 DIAGNOSIS — G9341 Metabolic encephalopathy: Secondary | ICD-10-CM | POA: Diagnosis not present

## 2021-08-11 DIAGNOSIS — I611 Nontraumatic intracerebral hemorrhage in hemisphere, cortical: Secondary | ICD-10-CM | POA: Diagnosis not present

## 2021-08-11 DIAGNOSIS — I771 Stricture of artery: Secondary | ICD-10-CM | POA: Diagnosis not present

## 2021-08-11 DIAGNOSIS — Z87891 Personal history of nicotine dependence: Secondary | ICD-10-CM | POA: Diagnosis not present

## 2021-08-11 DIAGNOSIS — Z794 Long term (current) use of insulin: Secondary | ICD-10-CM | POA: Diagnosis not present

## 2021-08-11 DIAGNOSIS — E871 Hypo-osmolality and hyponatremia: Secondary | ICD-10-CM | POA: Diagnosis present

## 2021-08-11 DIAGNOSIS — G939 Disorder of brain, unspecified: Secondary | ICD-10-CM | POA: Diagnosis not present

## 2021-08-11 DIAGNOSIS — R900 Intracranial space-occupying lesion found on diagnostic imaging of central nervous system: Secondary | ICD-10-CM | POA: Diagnosis not present

## 2021-08-11 DIAGNOSIS — J341 Cyst and mucocele of nose and nasal sinus: Secondary | ICD-10-CM | POA: Diagnosis not present

## 2021-08-11 DIAGNOSIS — R911 Solitary pulmonary nodule: Secondary | ICD-10-CM | POA: Diagnosis not present

## 2021-08-11 DIAGNOSIS — Z7982 Long term (current) use of aspirin: Secondary | ICD-10-CM

## 2021-08-11 DIAGNOSIS — R54 Age-related physical debility: Secondary | ICD-10-CM | POA: Diagnosis present

## 2021-08-11 DIAGNOSIS — E7800 Pure hypercholesterolemia, unspecified: Secondary | ICD-10-CM

## 2021-08-11 DIAGNOSIS — R4182 Altered mental status, unspecified: Secondary | ICD-10-CM | POA: Diagnosis not present

## 2021-08-11 DIAGNOSIS — M8458XA Pathological fracture in neoplastic disease, other specified site, initial encounter for fracture: Secondary | ICD-10-CM | POA: Diagnosis present

## 2021-08-11 DIAGNOSIS — Z79899 Other long term (current) drug therapy: Secondary | ICD-10-CM

## 2021-08-11 DIAGNOSIS — G9389 Other specified disorders of brain: Secondary | ICD-10-CM | POA: Diagnosis not present

## 2021-08-11 DIAGNOSIS — C7951 Secondary malignant neoplasm of bone: Secondary | ICD-10-CM | POA: Diagnosis present

## 2021-08-11 DIAGNOSIS — N400 Enlarged prostate without lower urinary tract symptoms: Secondary | ICD-10-CM | POA: Diagnosis present

## 2021-08-11 DIAGNOSIS — R296 Repeated falls: Secondary | ICD-10-CM | POA: Diagnosis present

## 2021-08-11 DIAGNOSIS — D649 Anemia, unspecified: Secondary | ICD-10-CM | POA: Diagnosis present

## 2021-08-11 DIAGNOSIS — Z7189 Other specified counseling: Secondary | ICD-10-CM

## 2021-08-11 DIAGNOSIS — C7931 Secondary malignant neoplasm of brain: Secondary | ICD-10-CM | POA: Diagnosis present

## 2021-08-11 DIAGNOSIS — R636 Underweight: Secondary | ICD-10-CM | POA: Diagnosis present

## 2021-08-11 DIAGNOSIS — Z681 Body mass index (BMI) 19 or less, adult: Secondary | ICD-10-CM | POA: Diagnosis not present

## 2021-08-11 DIAGNOSIS — R55 Syncope and collapse: Secondary | ICD-10-CM

## 2021-08-11 DIAGNOSIS — E119 Type 2 diabetes mellitus without complications: Secondary | ICD-10-CM | POA: Diagnosis not present

## 2021-08-11 DIAGNOSIS — R0602 Shortness of breath: Secondary | ICD-10-CM | POA: Diagnosis not present

## 2021-08-11 DIAGNOSIS — E222 Syndrome of inappropriate secretion of antidiuretic hormone: Secondary | ICD-10-CM | POA: Diagnosis not present

## 2021-08-11 DIAGNOSIS — Z515 Encounter for palliative care: Secondary | ICD-10-CM | POA: Diagnosis not present

## 2021-08-11 DIAGNOSIS — I6389 Other cerebral infarction: Secondary | ICD-10-CM | POA: Diagnosis not present

## 2021-08-11 DIAGNOSIS — R591 Generalized enlarged lymph nodes: Secondary | ICD-10-CM | POA: Diagnosis not present

## 2021-08-11 DIAGNOSIS — R29701 NIHSS score 1: Secondary | ICD-10-CM | POA: Diagnosis present

## 2021-08-11 DIAGNOSIS — I3139 Other pericardial effusion (noninflammatory): Secondary | ICD-10-CM | POA: Diagnosis not present

## 2021-08-11 DIAGNOSIS — I6340 Cerebral infarction due to embolism of unspecified cerebral artery: Secondary | ICD-10-CM | POA: Diagnosis not present

## 2021-08-11 DIAGNOSIS — C779 Secondary and unspecified malignant neoplasm of lymph node, unspecified: Secondary | ICD-10-CM | POA: Diagnosis not present

## 2021-08-11 DIAGNOSIS — J90 Pleural effusion, not elsewhere classified: Secondary | ICD-10-CM | POA: Diagnosis not present

## 2021-08-11 DIAGNOSIS — I639 Cerebral infarction, unspecified: Secondary | ICD-10-CM | POA: Diagnosis not present

## 2021-08-11 DIAGNOSIS — I959 Hypotension, unspecified: Secondary | ICD-10-CM | POA: Diagnosis not present

## 2021-08-11 DIAGNOSIS — I619 Nontraumatic intracerebral hemorrhage, unspecified: Secondary | ICD-10-CM | POA: Diagnosis not present

## 2021-08-11 DIAGNOSIS — C3491 Malignant neoplasm of unspecified part of right bronchus or lung: Secondary | ICD-10-CM | POA: Diagnosis not present

## 2021-08-11 HISTORY — DX: Hypo-osmolality and hyponatremia: E87.1

## 2021-08-11 LAB — TROPONIN I (HIGH SENSITIVITY)
Troponin I (High Sensitivity): 25 ng/L — ABNORMAL HIGH (ref <18)
Troponin I (High Sensitivity): 28 ng/L — ABNORMAL HIGH (ref <18)

## 2021-08-11 LAB — CBC WITH DIFFERENTIAL/PLATELET
Abs Immature Granulocytes: 0.22 10*3/uL — ABNORMAL HIGH (ref 0.00–0.07)
Basophils Absolute: 0 10*3/uL (ref 0.0–0.1)
Basophils Relative: 0 %
Eosinophils Absolute: 0 10*3/uL (ref 0.0–0.5)
Eosinophils Relative: 0 %
HCT: 33.4 % — ABNORMAL LOW (ref 39.0–52.0)
Hemoglobin: 11.8 g/dL — ABNORMAL LOW (ref 13.0–17.0)
Immature Granulocytes: 2 %
Lymphocytes Relative: 6 %
Lymphs Abs: 0.6 10*3/uL — ABNORMAL LOW (ref 0.7–4.0)
MCH: 34.4 pg — ABNORMAL HIGH (ref 26.0–34.0)
MCHC: 35.3 g/dL (ref 30.0–36.0)
MCV: 97.4 fL (ref 80.0–100.0)
Monocytes Absolute: 0.8 10*3/uL (ref 0.1–1.0)
Monocytes Relative: 7 %
Neutro Abs: 9.2 10*3/uL — ABNORMAL HIGH (ref 1.7–7.7)
Neutrophils Relative %: 85 %
Platelets: 221 10*3/uL (ref 150–400)
RBC: 3.43 MIL/uL — ABNORMAL LOW (ref 4.22–5.81)
RDW: 12.2 % (ref 11.5–15.5)
WBC: 10.8 10*3/uL — ABNORMAL HIGH (ref 4.0–10.5)
nRBC: 0 % (ref 0.0–0.2)

## 2021-08-11 LAB — COMPREHENSIVE METABOLIC PANEL
ALT: 22 U/L (ref 0–44)
AST: 31 U/L (ref 15–41)
Albumin: 2.9 g/dL — ABNORMAL LOW (ref 3.5–5.0)
Alkaline Phosphatase: 167 U/L — ABNORMAL HIGH (ref 38–126)
Anion gap: 13 (ref 5–15)
BUN: 11 mg/dL (ref 8–23)
CO2: 21 mmol/L — ABNORMAL LOW (ref 22–32)
Calcium: 8 mg/dL — ABNORMAL LOW (ref 8.9–10.3)
Chloride: 88 mmol/L — ABNORMAL LOW (ref 98–111)
Creatinine, Ser: 0.44 mg/dL — ABNORMAL LOW (ref 0.61–1.24)
GFR, Estimated: >60 mL/min (ref >60)
Glucose, Bld: 218 mg/dL — ABNORMAL HIGH (ref 70–99)
Potassium: 3.5 mmol/L (ref 3.5–5.1)
Sodium: 122 mmol/L — ABNORMAL LOW (ref 135–145)
Total Bilirubin: 1.4 mg/dL — ABNORMAL HIGH (ref 0.3–1.2)
Total Protein: 6 g/dL — ABNORMAL LOW (ref 6.5–8.1)

## 2021-08-11 LAB — URINALYSIS, ROUTINE W REFLEX MICROSCOPIC
Bilirubin Urine: NEGATIVE
Glucose, UA: 50 mg/dL — AB
Hgb urine dipstick: NEGATIVE
Ketones, ur: 20 mg/dL — AB
Leukocytes,Ua: NEGATIVE
Nitrite: NEGATIVE
Protein, ur: NEGATIVE mg/dL
Specific Gravity, Urine: 1.015 (ref 1.005–1.030)
pH: 6 (ref 5.0–8.0)

## 2021-08-11 LAB — OSMOLALITY: Osmolality: 268 mOsm/kg — ABNORMAL LOW (ref 275–295)

## 2021-08-11 LAB — GLUCOSE, CAPILLARY: Glucose-Capillary: 349 mg/dL — ABNORMAL HIGH (ref 70–99)

## 2021-08-11 LAB — CREATININE, URINE, RANDOM: Creatinine, Urine: 56 mg/dL

## 2021-08-11 LAB — SODIUM, URINE, RANDOM: Sodium, Ur: 50 mmol/L

## 2021-08-11 LAB — OSMOLALITY, URINE: Osmolality, Ur: 503 mOsm/kg (ref 300–900)

## 2021-08-11 MED ORDER — ALBUTEROL SULFATE (2.5 MG/3ML) 0.083% IN NEBU
2.5000 mg | INHALATION_SOLUTION | Freq: Four times a day (QID) | RESPIRATORY_TRACT | Status: DC
  Administered 2021-08-11: 2.5 mg via RESPIRATORY_TRACT
  Filled 2021-08-11: qty 3

## 2021-08-11 MED ORDER — TAMSULOSIN HCL 0.4 MG PO CAPS
0.4000 mg | ORAL_CAPSULE | Freq: Every day | ORAL | Status: DC
  Administered 2021-08-11 – 2021-08-17 (×7): 0.4 mg via ORAL
  Filled 2021-08-11 (×7): qty 1

## 2021-08-11 MED ORDER — HYDRALAZINE HCL 20 MG/ML IJ SOLN: 10.0000 mg | Freq: Four times a day (QID) | INTRAMUSCULAR | Status: DC | PRN

## 2021-08-11 MED ORDER — IPRATROPIUM-ALBUTEROL 0.5-2.5 (3) MG/3ML IN SOLN
3.0000 mL | Freq: Four times a day (QID) | RESPIRATORY_TRACT | Status: DC
  Administered 2021-08-11 – 2021-08-12 (×3): 3 mL via RESPIRATORY_TRACT
  Filled 2021-08-11 (×3): qty 3

## 2021-08-11 MED ORDER — SODIUM CHLORIDE 0.9% FLUSH: 3.0000 mL | INTRAVENOUS | Status: DC | PRN

## 2021-08-11 MED ORDER — SODIUM CHLORIDE 0.9 % IV SOLN: 250.0000 mL | INTRAVENOUS | Status: DC | PRN

## 2021-08-11 MED ORDER — ACETAMINOPHEN 325 MG PO TABS
650.0000 mg | ORAL_TABLET | Freq: Four times a day (QID) | ORAL | Status: DC | PRN
  Filled 2021-08-11: qty 2

## 2021-08-11 MED ORDER — DOCUSATE SODIUM 100 MG PO CAPS
100.0000 mg | ORAL_CAPSULE | Freq: Two times a day (BID) | ORAL | Status: DC
  Administered 2021-08-11 – 2021-08-17 (×4): 100 mg via ORAL
  Filled 2021-08-11 (×7): qty 1

## 2021-08-11 MED ORDER — ADULT MULTIVITAMIN W/MINERALS CH
1.0000 | ORAL_TABLET | Freq: Every day | ORAL | Status: DC
  Administered 2021-08-11 – 2021-08-18 (×8): 1 via ORAL
  Filled 2021-08-11 (×8): qty 1

## 2021-08-11 MED ORDER — SODIUM CHLORIDE 0.9% FLUSH
3.0000 mL | Freq: Two times a day (BID) | INTRAVENOUS | Status: DC
  Administered 2021-08-11 – 2021-08-17 (×6): 3 mL via INTRAVENOUS

## 2021-08-11 MED ORDER — ALBUTEROL SULFATE (2.5 MG/3ML) 0.083% IN NEBU
2.5000 mg | INHALATION_SOLUTION | RESPIRATORY_TRACT | Status: DC | PRN
  Administered 2021-08-16: 2.5 mg via RESPIRATORY_TRACT
  Filled 2021-08-11: qty 3

## 2021-08-11 MED ORDER — GUAIFENESIN ER 600 MG PO TB12
600.0000 mg | ORAL_TABLET | Freq: Two times a day (BID) | ORAL | Status: DC
  Administered 2021-08-11 – 2021-08-18 (×14): 600 mg via ORAL
  Filled 2021-08-11 (×15): qty 1

## 2021-08-11 MED ORDER — INSULIN ASPART 100 UNIT/ML IJ SOLN
0.0000 [IU] | Freq: Every day | INTRAMUSCULAR | Status: DC
  Administered 2021-08-11: 4 [IU] via SUBCUTANEOUS
  Administered 2021-08-12: 2 [IU] via SUBCUTANEOUS
  Administered 2021-08-14: 3 [IU] via SUBCUTANEOUS
  Administered 2021-08-15: 2 [IU] via SUBCUTANEOUS
  Administered 2021-08-17: 4 [IU] via SUBCUTANEOUS
  Filled 2021-08-11 (×5): qty 1

## 2021-08-11 MED ORDER — ONDANSETRON HCL 4 MG/2ML IJ SOLN: 4.0000 mg | Freq: Four times a day (QID) | INTRAMUSCULAR | Status: DC | PRN

## 2021-08-11 MED ORDER — DEXAMETHASONE SODIUM PHOSPHATE 10 MG/ML IJ SOLN
10.0000 mg | Freq: Once | INTRAMUSCULAR | Status: AC
  Administered 2021-08-11: 10 mg via INTRAVENOUS
  Filled 2021-08-11: qty 1

## 2021-08-11 MED ORDER — GADOBUTROL 1 MMOL/ML IV SOLN
5.0000 mL | Freq: Once | INTRAVENOUS | Status: AC | PRN
  Administered 2021-08-11: 5 mL via INTRAVENOUS

## 2021-08-11 MED ORDER — ONDANSETRON HCL 4 MG PO TABS: 4.0000 mg | ORAL_TABLET | Freq: Four times a day (QID) | ORAL | Status: DC | PRN

## 2021-08-11 MED ORDER — BENAZEPRIL HCL 20 MG PO TABS
20.0000 mg | ORAL_TABLET | Freq: Every day | ORAL | Status: DC
  Administered 2021-08-11 – 2021-08-18 (×8): 20 mg via ORAL
  Filled 2021-08-11 (×9): qty 1

## 2021-08-11 MED ORDER — LORAZEPAM 0.5 MG PO TABS: 0.5000 mg | ORAL_TABLET | Freq: Four times a day (QID) | ORAL | Status: DC | PRN

## 2021-08-11 MED ORDER — INSULIN ASPART 100 UNIT/ML IJ SOLN
0.0000 [IU] | Freq: Three times a day (TID) | INTRAMUSCULAR | Status: DC
  Administered 2021-08-12: 7 [IU] via SUBCUTANEOUS
  Administered 2021-08-12: 9 [IU] via SUBCUTANEOUS
  Administered 2021-08-12: 7 [IU] via SUBCUTANEOUS
  Administered 2021-08-13 (×2): 5 [IU] via SUBCUTANEOUS
  Administered 2021-08-13 – 2021-08-14 (×2): 7 [IU] via SUBCUTANEOUS
  Filled 2021-08-11 (×7): qty 1

## 2021-08-11 MED ORDER — DEXAMETHASONE SODIUM PHOSPHATE 10 MG/ML IJ SOLN: 10.0000 mg | Freq: Every day | INTRAMUSCULAR | Status: DC

## 2021-08-11 MED ORDER — ACETAMINOPHEN 650 MG RE SUPP
650.0000 mg | Freq: Four times a day (QID) | RECTAL | Status: DC | PRN
Start: 2021-08-11 — End: 2021-08-18

## 2021-08-11 MED ORDER — HALOPERIDOL LACTATE 5 MG/ML IJ SOLN
2.0000 mg | Freq: Once | INTRAMUSCULAR | Status: AC
  Administered 2021-08-11: 2 mg via INTRAVENOUS
  Filled 2021-08-11: qty 1

## 2021-08-11 MED ORDER — PRAVASTATIN SODIUM 20 MG PO TABS
20.0000 mg | ORAL_TABLET | Freq: Every day | ORAL | Status: DC
  Administered 2021-08-11 – 2021-08-18 (×8): 20 mg via ORAL
  Filled 2021-08-11 (×8): qty 1

## 2021-08-11 MED ORDER — SODIUM CHLORIDE 1 G PO TABS
2.0000 g | ORAL_TABLET | Freq: Two times a day (BID) | ORAL | Status: DC
  Administered 2021-08-11 – 2021-08-18 (×14): 2 g via ORAL
  Filled 2021-08-11 (×14): qty 2

## 2021-08-11 MED ORDER — DEXAMETHASONE SODIUM PHOSPHATE 4 MG/ML IJ SOLN
4.0000 mg | Freq: Four times a day (QID) | INTRAMUSCULAR | Status: DC
  Administered 2021-08-11 – 2021-08-12 (×2): 4 mg via INTRAVENOUS
  Filled 2021-08-11 (×2): qty 1

## 2021-08-11 MED ORDER — LORAZEPAM 2 MG/ML IJ SOLN
0.5000 mg | Freq: Once | INTRAMUSCULAR | Status: AC | PRN
  Administered 2021-08-11: 0.5 mg via INTRAVENOUS
  Filled 2021-08-11: qty 1

## 2021-08-11 MED ORDER — MAGNESIUM OXIDE 400 MG PO TABS
400.0000 mg | ORAL_TABLET | Freq: Every day | ORAL | Status: DC
  Administered 2021-08-12 – 2021-08-18 (×7): 400 mg via ORAL
  Filled 2021-08-11 (×16): qty 1

## 2021-08-11 MED ORDER — POTASSIUM CHLORIDE CRYS ER 20 MEQ PO TBCR
40.0000 meq | EXTENDED_RELEASE_TABLET | Freq: Once | ORAL | Status: AC
  Administered 2021-08-11: 40 meq via ORAL
  Filled 2021-08-11: qty 2

## 2021-08-11 MED ORDER — SODIUM CHLORIDE 0.9% FLUSH
3.0000 mL | Freq: Two times a day (BID) | INTRAVENOUS | Status: DC
  Administered 2021-08-11 – 2021-08-18 (×15): 3 mL via INTRAVENOUS

## 2021-08-11 MED ORDER — FINASTERIDE 5 MG PO TABS
5.0000 mg | ORAL_TABLET | Freq: Every day | ORAL | Status: DC
  Administered 2021-08-11 – 2021-08-18 (×8): 5 mg via ORAL
  Filled 2021-08-11 (×8): qty 1

## 2021-08-11 MED ORDER — ENOXAPARIN SODIUM 40 MG/0.4ML IJ SOSY: 40.0000 mg | PREFILLED_SYRINGE | INTRAMUSCULAR | Status: DC

## 2021-08-11 MED ORDER — IPRATROPIUM BROMIDE 0.02 % IN SOLN
0.5000 mg | Freq: Four times a day (QID) | RESPIRATORY_TRACT | Status: DC
  Administered 2021-08-11: 0.5 mg via RESPIRATORY_TRACT
  Filled 2021-08-11: qty 2.5

## 2021-08-11 NOTE — Consult Note (Signed)
? ?Hematology/Oncology Consult note ?Telephone:(336) [phone] [phone] ? ?  ? ? ?Patient Care Team: ?Derinda Late, MD as PCP - General (Family Medicine) ?Lloyd Huger, MD as Consulting Physician (Oncology) ?Telford Nab, RN as Sales executive  ? ?Name of the patient: John Perez  ?[id number]  ?10-12-1940  ? ?Date of visit: 08/11/21 ?REASON FOR COSULTATION:  ?Hyponatremia, brain lesion, worsening lung lesions.  ? ?History of presenting illness-  ?81 y.o. male with PMH listed at below who presents to ER for evaluation of confusion, weakness. ?Initial work-up showed sodium level of 119.  Patient has a history of chronic hyponatremia and baseline sodium level is around 129. ?Patient is known to oncology service for stage III lung adenocarcinoma, patient follows up with Dr. Cecille Aver. ?06/13/2021 MRI brain did not reveal any metastatic lesion.  PET scan did not show distant metastasis in the scapular region was not felt to be a metastatic lesion.  Case was discussed with tumor board.  Dr. Cecille Aver and Dr. Cheron Schaumann recommend concurrent chemotherapy with weekly carboplatin and Taxol along with concurrent radiation for treatment of the stage III lung cancer.  There was plan to start chemotherapy and radiation this week. ? ?08/11/2021, CT head without contrast showed progressive expansile and suspicious hypodensity in the high left parietal lobe, involving cortex and subcortical white matter.  Questionable petechial hemorrhage vs spare cortex in this region.  Suspicious for malignancy and/or interval acute or subacute infarct. ?08/11/2021, CT chest without contrast showed interval increase of size of right perihilar mass, interval development of small right pleural effusion, interval development of interstitial thickening and groundglass opacities within the right lung suspicious for lymphangitic carcinomatosis.  Interval development of osseous metastatic lesion in the manubrium.  Mediastinal, hilar,  supraclavicular, axilla lymphadenopathy does not appear significantly changed since his PET scan in March 2023.  Interval development or worsening of numerous compression fractures including T1, T4, T5, T7, L1, L2. ? ?Hematology oncology was consulted.  I am covering Dr. Cecille Aver who is off today.  Discussed with emergency room physician and recommends loading with dexamethasone 10 mg followed by dexamethasone 4 mg every 6 hours.  Obtain MRI brain for further evaluation. ? ?08/11/2021, brain MRI without contrast showed lesion in the left occipital parietal lobe is most consistent with acute/subacute infarct with hemorrhagic transformation.  Given history of lung mass and concern of metastatic disease, postcontrast imaging is just when the patient is able to hold still. ? ?Patient's wife and daughter were at the bedside.  Patient is a poor historian.  Per family members, he does not have good appetite, poor oral intake.  Generalized weakness worsened recently.  No reported history of shortness of breath, fever, nausea vomiting. ? ? ?Review of Systems  ?Constitutional:  Positive for appetite change and fatigue. Negative for chills, fever and unexpected weight change.  ?HENT:   Negative for hearing loss and voice change.   ?Eyes:  Negative for eye problems and icterus.  ?Respiratory:  Negative for chest tightness, cough and shortness of breath.   ?Cardiovascular:  Negative for chest pain and leg swelling.  ?Gastrointestinal:  Negative for abdominal distention and abdominal pain.  ?Endocrine: Negative for hot flashes.  ?Genitourinary:  Negative for difficulty urinating, dysuria and frequency.   ?Musculoskeletal:  Negative for arthralgias.  ?Skin:  Negative for itching and rash.  ?Neurological:  Negative for light-headedness and numbness.  ?Hematological:  Negative for adenopathy. Does not bruise/bleed easily.  ?Psychiatric/Behavioral:  Positive for confusion.   ? ?No Known Allergies ? ?  Patient Active Problem List  ?  Diagnosis Date Noted  ? Acute metabolic encephalopathy 26/94/8546  ? Metastatic lung cancer (metastasis from lung to other site) [facility]) 08/11/2021  ? Hypercholesteremia   ? Hypertension   ? Adenocarcinoma, lung, right ([place]) 07/23/2021  ? Mass of right lung 06/28/2021  ? Hyponatremia 06/10/2021  ? Abnormal brain MRI 06/10/2021  ? Diabetes mellitus type 2, insulin dependent ([place])   ? Abnormal LFTs   ? ? ? ?Past Medical History:  ?Diagnosis Date  ? Diabetes mellitus type 2, insulin dependent ([place])   ? HOH (hard of hearing)   ? Hypercholesteremia   ? Hypertension   ? Hyponatremia   ? ? ? ?Past Surgical History:  ?Procedure Laterality Date  ? CATARACT EXTRACTION W/PHACO Left 02/25/2020  ? Procedure: CATARACT EXTRACTION PHACO AND INTRAOCULAR LENS PLACEMENT ([place]) LEFT DIABETIC;  Surgeon: Eulogio Bear, MD;  Location: [facility];  Service: Ophthalmology;  Laterality: Left;  4.98 ?0:39.4  ? CATARACT EXTRACTION W/PHACO Right 03/17/2020  ? Procedure: CATARACT EXTRACTION PHACO AND INTRAOCULAR LENS PLACEMENT ([place]) RIGHT DIABETIC;  Surgeon: Eulogio Bear, MD;  Location: [facility];  Service: Ophthalmology;  Laterality: Right;  6.33 ?0:55.3  ? GUM SURGERY    ? HERNIA REPAIR    ? x2  ? ? ?Social History  ? ?Socioeconomic History  ? Marital status: Married  ?  Spouse name: Not on file  ? Number of children: Not on file  ? Years of education: Not on file  ? Highest education level: Not on file  ?Occupational History  ? Not on file  ?Tobacco Use  ? Smoking status: Former  ?  Packs/day: 1.00  ?  Years: 15.00  ?  Pack years: 15.00  ?  Types: Cigarettes  ?  Quit date: 84  ?  Years since quitting: 33.3  ? Smokeless tobacco: Never  ?Vaping Use  ? Vaping Use: Never used  ?Substance and Sexual Activity  ? Alcohol use: Not Currently  ? Drug use: Not on file  ? Sexual activity: Not on file  ?Other Topics Concern  ? Not on file  ?Social History Narrative  ? Not on file  ? ?Social Determinants of Health  ? ?Financial  Resource Strain: Not on file  ?Food Insecurity: Not on file  ?Transportation Needs: Not on file  ?Physical Activity: Not on file  ?Stress: Not on file  ?Social Connections: Not on file  ?Intimate Partner Violence: Not on file  ? ?  ?No family history on file. ? ? ?Current Facility-Administered Medications:  ?  0.9 %  sodium chloride infusion, 250 mL, Intravenous, PRN, Pokhrel, Laxman, MD ?  acetaminophen (TYLENOL) tablet 650 mg, 650 mg, Oral, Q6H PRN **OR** acetaminophen (TYLENOL) suppository 650 mg, 650 mg, Rectal, Q6H PRN, Pokhrel, Laxman, MD ?  albuterol (PROVENTIL) (2.5 MG/3ML) 0.083% nebulizer solution 2.5 mg, 2.5 mg, Nebulization, Q6H, Pokhrel, Laxman, MD, 2.5 mg at 08/11/21 1529 ?  albuterol (PROVENTIL) (2.5 MG/3ML) 0.083% nebulizer solution 2.5 mg, 2.5 mg, Nebulization, Q2H PRN, Pokhrel, Laxman, MD ?  benazepril (LOTENSIN) tablet 20 mg, 20 mg, Oral, Daily, Pokhrel, Laxman, MD, 20 mg at 08/11/21 1511 ?  [START ON 08/12/2021] dexamethasone (DECADRON) injection 10 mg, 10 mg, Intravenous, Daily, Pokhrel, Laxman, MD ?  docusate sodium (COLACE) capsule 100 mg, 100 mg, Oral, BID, Pokhrel, Laxman, MD ?  finasteride (PROSCAR) tablet 5 mg, 5 mg, Oral, Daily, Pokhrel, Laxman, MD, 5 mg at 08/11/21 1510 ?  gadobutrol (GADAVIST) 1 MMOL/ML injection  5 mL, 5 mL, Intravenous, Once PRN, Nena Polio, MD ?  guaiFENesin (MUCINEX) 12 hr tablet 600 mg, 600 mg, Oral, BID, Pokhrel, Laxman, MD ?  hydrALAZINE (APRESOLINE) injection 10 mg, 10 mg, Intravenous, Q6H PRN, Pokhrel, Laxman, MD ?  ipratropium (ATROVENT) nebulizer solution 0.5 mg, 0.5 mg, Nebulization, Q6H, Pokhrel, Laxman, MD, 0.5 mg at 08/11/21 1519 ?  magnesium oxide (MAG-OX) tablet 400 mg, 400 mg, Oral, Daily, Pokhrel, Laxman, MD ?  multivitamin with minerals tablet 1 tablet, 1 tablet, Oral, Daily, Pokhrel, Laxman, MD, 1 tablet at 08/11/21 1513 ?  ondansetron (ZOFRAN) tablet 4 mg, 4 mg, Oral, Q6H PRN **OR** ondansetron (ZOFRAN) injection 4 mg, 4 mg, Intravenous, Q6H PRN,  Pokhrel, Laxman, MD ?  pravastatin (PRAVACHOL) tablet 20 mg, 20 mg, Oral, Daily, Pokhrel, Laxman, MD, 20 mg at 08/11/21 1514 ?  sodium chloride flush (NS) 0.9 % injection 3 mL, 3 mL, Intravenous, Q12H, Po

## 2021-08-11 NOTE — H&P (Addendum)
?[facility] ?History and Physical ? ?John Perez LYY:[id number] DOB: [birth year] DOA: 08/11/2021 ? ?Referring physician: ED ? ?PCP: Derinda Late, MD  ? ?Patient is coming from: Home ? ?Chief Complaint: Altered mental status ? ?HPI: John Perez is a 81 y.o. male Patient is a 81 years old male with past medical history of diabetes, hyperlipidemia, hypertension, history of lung cancer who follows up with Dr. Grayland Ormond as outpatient, history of chronic hyponatremia presented to hospital with worsening confusion.  Patient has been more confused than usual and had passed out yesterday.  Patient sodium level was 119 and on repeat in the ED was 122.  Patient was then brought into the hospital for altered mental status.  Patient does have a history of lung cancer and was scheduled to undergo radiation treatment starting today but has presented to the hospital at this time.  Patient's daughter at bedside stated that patient was confused lethargic yesterday and had few falls.  He has been having impaired appetite for the last 2 to 3 days.  Normally was able to ambulate by himself but for the last 2 to 3 days has not been able to do much.  There is no mention of nausea vomiting or diarrhea.  No mention of fever chills or rigor.  No mention of increasing cough or shortness of breath/dyspnea.  Denies any urinary urgency frequency dysuria and but not been drinking much recently.  Patient does have history of chronic hyponatremia and was on salt tablets in the past but was not currently taking it as per the primary care physician. ? ?In the ED, patient had stable vitals.  UA showed some glucose but no evidence of infection..  Initial BNP was 122.  Potassium was 3.5.  Creatinine of 0.4.  WBC was mildly elevated at 10.8.  Hemoglobin of 11.8.  Chest x-ray showed extensive interstitial and airspace opacities throughout the right lung significantly progressed from previous PET CT scan on 07/07/2021.  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  CT head scan showed progressive expansile and conspicuous hypodensity in the high left parietal lobe.  MRI of the was recommended.  Patient received dexamethasone 10 mg IV in the ED.  Oncology Dr. Tasia Catchings was notified from the ED and patient was consulted for admission to the hospital for further evaluation and treatment. ? ?Assessment and plan. ? ?Principal Problem: ?  Acute metabolic encephalopathy ?Active Problems: ?  Hyponatremia ?  Diabetes mellitus type 2, insulin dependent ([place]) ?  Metastatic lung cancer (metastasis from lung to other site) [facility]) ?  Hypercholesteremia ?  Hypertension ?  ?Altered mental status, metabolic encephalopathy likely secondary to hyponatremia, metastatic cancer with brain metastasis patient does have chronic hyponatremia.  Latest sodium of 122.  Likely secondary to SIADH.  Will check urinary labs osmolality.  We will put the patient on fluid restriction for now.  Dexamethasone 10 mg was given in the ED.  We will continue with that starting tomorrow..  We will hold oxycodone for now.  Patient was on Paxil as outpatient will hold for now.  We will check a serum muscularity urine osmolality urinary sodium and creatinine. ? 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  Patient was supposed to undergo radiation treatment today but has  come to the hospital.  Dr. Tasia Catchings oncology was notified.  Patient's primary oncologist is Dr. Grayland Ormond.  We will follow oncology recommendation.  We will consult palliative care with goals of care and extensive metastatic disease.  Patient has extensive tumor burden.  Patient has  overall poor prognosis.  Spoke with the patient's daughter at bedside.  She wishes to proceed with MRI of the brain and wants to discuss with oncology about further management plans. ? ?Diabetes mellitus type 2.  Continue sliding scale insulin Accu-Cheks diabetic diet.  Was on metformin and NovoLog as outpatient.  Will adjust for steroids ? ?Hypertension.  On benazepril and Lasix 10 mg as outpatient.  Continue benazepril.  Hold Lasix. ? ?Hyperlipidemia.  On pravastatin as outpatient, will resume ? ?History of BPH.  On tamsulosin and finasteride.  Will resume. ? ?Spinal metastasis with multiple compression fracture.  We will get PT evaluation.  On oxycodone at home.  Will resume from tomorrow when mentation improves. ? ?Borderline low potassium level.  We will replenish orally.  Check levels in a.m. ? ? ?Review of Systems:  ?All systems were reviewed and were negative unless otherwise mentioned in the HPI ? ?Past Medical History:  ?Diagnosis Date  ? Diabetes mellitus type 2, insulin dependent ([place])   ? HOH (hard of hearing)   ? Hypercholesteremia   ? Hypertension   ? Hyponatremia   ? ?Past Surgical History:  ?Procedure Laterality Date  ? CATARACT EXTRACTION W/PHACO Left 02/25/2020  ? Procedure: CATARACT EXTRACTION PHACO AND INTRAOCULAR LENS PLACEMENT ([place]) LEFT DIABETIC;  Surgeon: Eulogio Bear, MD;  Location: [place];  Service: Ophthalmology;  Laterality: Left;  4.98 ?0:39.4  ? CATARACT EXTRACTION W/PHACO Right 03/17/2020  ? Procedure: CATARACT EXTRACTION PHACO AND INTRAOCULAR LENS PLACEMENT ([place]) RIGHT DIABETIC;  Surgeon: Eulogio Bear, MD;  Location: [place];  Service: Ophthalmology;  Laterality: Right;  6.33 ?0:55.3  ? GUM SURGERY    ? HERNIA REPAIR    ? x2  ? ? ?Social History:  reports that he quit smoking about 33 years ago. His smoking use included cigarettes. He has a 15.00 pack-year smoking history. He has never used smokeless tobacco. He reports that he does not currently  use alcohol. No history on file for drug use. ? ?No Known Allergies ? ?No family history on file.  ? ?Prior to Admission medications   ?Medication Sig Start Date End Date Taking? Authorizing Provider  ?ASPIRIN 81 PO Take by mouth daily.    [provider]  ?benazepril (LOTENSIN) 20 MG tablet Take 20 mg by mouth daily.    [provider]  ?finasteride (PROSCAR) 5 MG tablet Take 5 mg by mouth daily.    [provider]  ?furosemide (LASIX) 20 MG tablet Take 0.5 tablets (10 mg total) by mouth daily. ?Patient not taking: Reported on 07/09/2021 06/18/21   Fritzi Mandes, MD  ?gabapentin (NEURONTIN) 300 MG capsule Take 1 capsule by mouth at bedtime. ?Patient not taking: Reported on 07/09/2021 07/08/21 07/08/22  [provider]  ?LORazepam (ATIVAN) 0.5 MG tablet Take 0.5 mg by mouth daily as needed. ?Patient not taking: Reported on 07/09/2021 06/19/21   [provider]  ?magnesium oxide (MAG-OX) 400 (240 Mg) MG tablet Take 1 tablet (400 mg total) by mouth 2 (two) times daily. ?Patient not taking: Reported on 07/09/2021 06/17/21   Fritzi Mandes, MD  ?magnesium oxide (MAG-OX) 400 MG tablet Take by mouth. ?Patient not taking: Reported on 07/09/2021 06/17/21   [provider]  ?MELATONIN PO Take by mouth at bedtime as needed. ?Patient not taking: Reported on 07/09/2021    [provider]  ?metFORMIN (GLUCOPHAGE) 1000 MG tablet Take 1 tablet (1,000 mg total) by mouth 2 (two) times daily with a meal. 06/17/21   Fritzi Mandes, MD  ?mirtazapine (REMERON) 15 MG tablet Take 15 mg by mouth at bedtime. 07/10/21   [provider]  ?Multiple Vitamin (MULTI-VITAMIN) tablet Take 1 tablet by mouth daily. ?Patient not taking: Reported on 07/09/2021    [provider]  ?Multiple Vitamin (MULTIVITAMIN PO) Take by mouth daily. ?Patient not taking: Reported on 07/09/2021    [provider]  ?Omega-3 Fatty Acids (FISH OIL PO) Take by mouth daily. ?Patient not taking: Reported on  07/09/2021    [provider]  ?ondansetron (ZOFRAN) 8 MG tablet Take 1 tablet (8 mg total) by mouth 2 (two) times daily as needed for refractory nausea / vomiting. 07/31/21   Lloyd Huger, MD  ?Oxycodone

## 2021-08-11 NOTE — ED Notes (Signed)
Patient had large BM and urinated in bed. This RN cleaned patient and performed bed changed. New gown given and clothing given to daughter in patient belongings bag. Bed locked and lowered with call light in reach. Daughter at bedside. ?

## 2021-08-11 NOTE — ED Notes (Signed)
Pharmacy tech at bedside 

## 2021-08-11 NOTE — ED Notes (Signed)
Patient had large BM. Patient cleaned. New chuck pads and brief placed. Bed lowered and locked. Call light within reach. NAD noted. No needs expressed to RN. Family at bedside. ?

## 2021-08-11 NOTE — ED Notes (Signed)
Patient to MRI at this time.

## 2021-08-11 NOTE — Telephone Encounter (Signed)
Pt's daughter left message stating that pt is having increased weakness, lethargy, and fall that started last night. She is not sure if he needs to go to the ED at this time or if needs to come into the clinic. Message sent to covering providers who recommended ED evaluation. At the time message received, pt had already arrived at the ED. Message left with pt's daughter regarding recommendations for ED evaluation and will cancel radiation today. Will reassess tomorrow. Instructed to call back with any questions or needs.  ?

## 2021-08-11 NOTE — ED Notes (Signed)
Patient appears to be sleeping at this time. NAD noted. Family at bedside. Bed lowered and locked with call bell within patient reach. ?

## 2021-08-11 NOTE — ED Notes (Signed)
Patient had 2nd large BM. Patient cleaned and new brief applied. ?

## 2021-08-11 NOTE — ED Triage Notes (Signed)
Pt here with his daughter, states pt has increased confusion and multiple falls last night. States he was hard to arouse this morning,. Pt was suppose to start radiation therapy for lung CA , had labs yesterday with a Na+ level of 119 ?

## 2021-08-11 NOTE — ED Notes (Signed)
Patient to CT at this time

## 2021-08-11 NOTE — Consult Note (Addendum)
? ?                                                                                ?Consultation Note ?Date: 08/11/2021  ? ?Patient Name: John Perez  ?DOB: 04/11/1941  MRN: [id number]  Age / Sex: 81 y.o., male  ?PCP: Derinda Late, MD ?Referring Physician: Flora Lipps, MD ? ?Reason for Consultation: Establishing goals of care ? ?HPI/Patient Profile: John Perez is a 81 y.o. male who comes in because he is more confused than usual and fell down yesterday he seems to have passed out.  Patient's sodium yesterday was 119.  Usually runs around 129 has been as low as 112.  Patient was scheduled to get radiation therapy for lung cancer starting today ? ?Clinical Assessment and Goals of Care: ?Notes and labs reviewed. Patient is resting in bed with son and daughter at bedside.  ? ?They discuss his last hospitalization and treatment for hyponatremia and that he had improved. They discuss that he was supposed to start oncologic treatment this week. They state in the past few days he has become more weak and frail.  ? ?They are waiting for information from oncology. Will follow up tomorrow. ?  ? ?SUMMARY OF RECOMMENDATIONS   ?Will follow up tomorrow. ? ? ? ? ?  ? ?Primary Diagnoses: ?Present on Admission: ? Acute metabolic encephalopathy ? Hyponatremia ? ? ?I have reviewed the medical record, interviewed the patient and family, and examined the patient. The following aspects are pertinent. ? ?Past Medical History:  ?Diagnosis Date  ? Diabetes mellitus type 2, insulin dependent ([place])   ? HOH (hard of hearing)   ? Hypercholesteremia   ? Hypertension   ? Hyponatremia   ? ?Social History  ? ?Socioeconomic History  ? Marital status: Married  ?  Spouse name: Not on file  ? Number of children: Not on file  ? Years of education: Not on file  ? Highest education level: Not on file  ?Occupational History  ? Not on file  ?Tobacco Use  ? Smoking status: Former  ?  Packs/day: 1.00  ?  Years: 15.00  ?  Pack years: 15.00  ?  Types:  Cigarettes  ?  Quit date: 31  ?  Years since quitting: 33.3  ? Smokeless tobacco: Never  ?Vaping Use  ? Vaping Use: Never used  ?Substance and Sexual Activity  ? Alcohol use: Not Currently  ? Drug use: Not on file  ? Sexual activity: Not on file  ?Other Topics Concern  ? Not on file  ?Social History Narrative  ? Not on file  ? ?Social Determinants of Health  ? ?Financial Resource Strain: Not on file  ?Food Insecurity: Not on file  ?Transportation Needs: Not on file  ?Physical Activity: Not on file  ?Stress: Not on file  ?Social Connections: Not on file  ? ?No family history on file. ?Scheduled Meds: ? albuterol  2.5 mg Nebulization Q6H  ? benazepril  20 mg Oral Daily  ? [START ON 08/12/2021] dexamethasone (DECADRON) injection  10 mg Intravenous Daily  ? docusate sodium  100 mg Oral BID  ? finasteride  5 mg Oral Daily  ? guaiFENesin  600 mg Oral BID  ? ipratropium  0.5 mg Nebulization Q6H  ? magnesium oxide  400 mg Oral Daily  ? multivitamin with minerals  1 tablet Oral Daily  ? pravastatin  20 mg Oral Daily  ? sodium chloride flush  3 mL Intravenous Q12H  ? sodium chloride flush  3 mL Intravenous Q12H  ? sodium chloride  2 g Oral BID WC  ? tamsulosin  0.4 mg Oral QPC supper  ? ?Continuous Infusions: ? sodium chloride    ? ?PRN Meds:.sodium chloride, acetaminophen **OR** acetaminophen, albuterol, gadobutrol, hydrALAZINE, ondansetron **OR** ondansetron (ZOFRAN) IV, sodium chloride flush ?Medications Prior to Admission:  ?Prior to Admission medications   ?Medication Sig Start Date End Date Taking? Authorizing Provider  ?ASPIRIN 81 PO Take by mouth daily.   Yes [provider]  ?finasteride (PROSCAR) 5 MG tablet Take 5 mg by mouth daily.   Yes [provider]  ?gabapentin (NEURONTIN) 300 MG capsule Take 1 capsule by mouth at bedtime. 07/08/21 07/08/22 Yes [provider]  ?MELATONIN PO Take 5 mg by mouth at bedtime as needed.   Yes [provider]  ?metFORMIN (GLUCOPHAGE) 1000 MG tablet  Take 1 tablet (1,000 mg total) by mouth 2 (two) times daily with a meal. 06/17/21  Yes Fritzi Mandes, MD  ?mirtazapine (REMERON) 15 MG tablet Take 15 mg by mouth at bedtime. 07/10/21  Yes [provider]  ?Oxycodone HCl 10 MG TABS Take 1 tablet (10 mg total) by mouth every 6 (six) hours as needed. 08/07/21  Yes Lloyd Huger, MD  ?PARoxetine (PAXIL) 10 MG tablet Take 10 mg by mouth daily. 06/05/21  Yes [provider]  ?pravastatin (PRAVACHOL) 20 MG tablet Take 20 mg by mouth daily.   Yes [provider]  ?tamsulosin (FLOMAX) 0.4 MG CAPS capsule Take 0.4 mg by mouth.   Yes [provider]  ?benazepril (LOTENSIN) 20 MG tablet Take 20 mg by mouth daily. ?Patient not taking: Reported on 08/11/2021    [provider]  ?furosemide (LASIX) 20 MG tablet Take 0.5 tablets (10 mg total) by mouth daily. ?Patient not taking: Reported on 07/09/2021 06/18/21   Fritzi Mandes, MD  ?LORazepam (ATIVAN) 0.5 MG tablet Take 0.5 mg by mouth daily as needed. ?Patient not taking: Reported on 07/09/2021 06/19/21   [provider]  ?magnesium oxide (MAG-OX) 400 (240 Mg) MG tablet Take 1 tablet (400 mg total) by mouth 2 (two) times daily. ?Patient not taking: Reported on 07/09/2021 06/17/21   Fritzi Mandes, MD  ?magnesium oxide (MAG-OX) 400 MG tablet Take by mouth. ?Patient not taking: Reported on 07/09/2021 06/17/21   [provider]  ?Multiple Vitamin (MULTI-VITAMIN) tablet Take 1 tablet by mouth daily. ?Patient not taking: Reported on 07/09/2021    [provider]  ?Multiple Vitamin (MULTIVITAMIN PO) Take by mouth daily. ?Patient not taking: Reported on 07/09/2021    [provider]  ?Omega-3 Fatty Acids (FISH OIL PO) Take by mouth daily. ?Patient not taking: Reported on 07/09/2021    [provider]  ?ondansetron (ZOFRAN) 8 MG tablet Take 1 tablet (8 mg total) by mouth 2 (two) times daily as needed for refractory nausea / vomiting. 07/31/21   Lloyd Huger, MD   ?predniSONE (STERAPRED UNI-PAK 21 TAB) 10 MG (21) TBPK tablet As directed on packaging ?Patient not taking: Reported on 07/23/2021 07/23/21   Naaman Plummer, MD  ?prochlorperazine (COMPAZINE) 10 MG tablet Take 1 tablet (10 mg total) by mouth every 6 (six)  hours as needed (Nausea or vomiting). 07/31/21   Lloyd Huger, MD  ?sodium chloride 1 g tablet Take 2 tablets (2 g total) by mouth 2 (two) times daily with a meal. ?Patient not taking: Reported on 07/09/2021 06/17/21   Fritzi Mandes, MD  ? ?No Known Allergies ?Review of Systems  ?All other systems reviewed and are negative. ? ?Physical Exam ?Pulmonary:  ?   Effort: Pulmonary effort is normal.  ?Neurological:  ?   Mental Status: He is alert.  ? ? ?Vital Signs: BP (!) 183/90   Pulse 94   Temp 97.6 ?F (36.4 ?C)   Resp 19   Wt 54.9 kg   SpO2 95%   BMI 17.36 kg/m?  ?Pain Scale: PAINAD ?  ?  ? ? ?SpO2: SpO2: 95 % ?O2 Device:SpO2: 95 % ?O2 Flow Rate: .  ? ?IO: Intake/output summary: No intake or output data in the 24 hours ending 08/11/21 1609 ? ?LBM:   ?Baseline Weight: Weight: 54.9 kg ?Most recent weight: Weight: 54.9 kg     ? ? ?Asencion Gowda, NP ?  ?Please contact Palliative Medicine Team phone at [phone] for questions and concerns.  ?For individual provider: See Amion ? ? ? ? ? ? ? ? ? ? ? ? ? ?

## 2021-08-11 NOTE — Progress Notes (Signed)
I was notified that the MRI of the brain showed left occipital lesion consistent with acute or subacute infarct/possible tumor related hemorrhage.  Communicated with oncology who recommended MRI with contrast.  I also spoke with Dr. Rory Percy neurology who will see the patient and advised not to put on Lovenox or antiplatelets and repeat MRI with IV contrast.  MRI with IV contrast has been ordered. ?

## 2021-08-11 NOTE — ED Provider Notes (Signed)
? ?[facility] ?Provider Note ? ? ? Event Date/Time  ? First MD Initiated Contact with Patient 08/11/21 [phone]   ?  (approximate) ? ? ?History  ? ?Altered Mental Status ? ? ?HPI ? ?John Perez is a 81 y.o. male who comes in because he is more confused than usual and fell down yesterday he seems to have passed out.  Patient's sodium yesterday was 119.  Usually runs around 129 has been as low as 112.  Patient was scheduled to get radiation therapy for lung cancer starting today. ? ?  ? ? ?Physical Exam  ? ?Triage Vital Signs: ?ED Triage Vitals  ?Enc Vitals Group  ?   BP 08/11/21 0834 (!) 142/86  ?   Pulse Rate 08/11/21 0834 87  ?   Resp 08/11/21 0834 17  ?   Temp 08/11/21 0837 97.6 ?F (36.4 ?C)  ?   Temp Source 08/11/21 0834 Oral  ?   SpO2 08/11/21 0834 97 %  ?   Weight 08/11/21 0834 121 lb (54.9 kg)  ?   Height --   ?   Head Circumference --   ?   Peak Flow --   ?   Pain Score --   ?   Pain Loc --   ?   Pain Edu? --   ?   Excl. in [place]? --   ? ? ?Most recent vital signs: ?Vitals:  ? 08/11/21 1430 08/11/21 1530  ?BP: (!) 158/82 (!) 183/90  ?Pulse: 86 94  ?Resp: 20 19  ?Temp:    ?SpO2: 95% 95%  ? ? ? ?General: Awake, alert, no distress.  ?Head normocephalic atraumatic ?Eyes pupils equal round reactive extraocular movements intact ?Neck supple nontender ?CV:  Good peripheral perfusion.  Heart regular rate and rhythm no audible murmurs ?Resp:  Normal effort.  Lungs are clear perhaps some decreased breath sounds in the right lower lobe ?Abd:  No distention.  Soft bowel sounds positive nontender ?Extremities no edema ?Neuro exam cranial nerves II through XII are intact cerebellar appears to be intact motor strength is mildly weak throughout 4/5 perhaps but no focal deficits are seen patient does not report any numbness ? ? ? ?ED Results / Procedures / Treatments  ? ?Labs ?(all labs ordered are listed, but only abnormal results are displayed) ?Labs Reviewed  ?COMPREHENSIVE METABOLIC PANEL - Abnormal;  Notable for the following components:  ?    Result Value  ? Sodium 122 (*)   ? Chloride 88 (*)   ? CO2 21 (*)   ? Glucose, Bld 218 (*)   ? Creatinine, Ser 0.44 (*)   ? Calcium 8.0 (*)   ? Total Protein 6.0 (*)   ? Albumin 2.9 (*)   ? Alkaline Phosphatase 167 (*)   ? Total Bilirubin 1.4 (*)   ? All other components within normal limits  ?CBC WITH DIFFERENTIAL/PLATELET - Abnormal; Notable for the following components:  ? WBC 10.8 (*)   ? RBC 3.43 (*)   ? Hemoglobin 11.8 (*)   ? HCT 33.4 (*)   ? MCH 34.4 (*)   ? Neutro Abs 9.2 (*)   ? Lymphs Abs 0.6 (*)   ? Abs Immature Granulocytes 0.22 (*)   ? All other components within normal limits  ?URINALYSIS, ROUTINE W REFLEX MICROSCOPIC - Abnormal; Notable for the following components:  ? Color, Urine YELLOW (*)   ? APPearance CLEAR (*)   ? Glucose, UA 50 (*)   ? Ketones,  ur 20 (*)   ? All other components within normal limits  ?TROPONIN I (HIGH SENSITIVITY) - Abnormal; Notable for the following components:  ? Troponin I (High Sensitivity) 25 (*)   ? All other components within normal limits  ?TROPONIN I (HIGH SENSITIVITY) - Abnormal; Notable for the following components:  ? Troponin I (High Sensitivity) 28 (*)   ? All other components within normal limits  ?SODIUM, URINE, RANDOM  ?CREATININE, URINE, RANDOM  ?OSMOLALITY, URINE  ?OSMOLALITY  ? ? ? ?EKG ? ?EKG read and interpreted by me shows normal sinus rhythm rate of 81 normal axis no acute ST-T wave changes at baseline is somewhat irregular ? ? ?RADIOLOGY ?Chest x-ray read by radiology and reviewed by me shows extensive haziness on the right side and new lesions on the left side possible mets chest CT is suggested ?Chest CT read by radiology reviewed by me shows multiple new compression fractures in marked progression of the cancer with a lot of new nodes etc. ?CT of the brain shows progression of apparent lesion in the brain.  MRI suggested. ?MRI of the brain read by radiology reviewed by me shows what appears to be a  subacute stroke with hemorrhagic transformation although could be a mass given the patient's lung cancer history. ?PROCEDURES: ? ?Critical Care performed: Critical care time half an hour this includes talking to the radiologist and the neurologist and the hospitalist twice.  I also discussed a number of things including CODE STATUS with the patient and his daughter. ? ?Procedures ? ? ?MEDICATIONS ORDERED IN ED: ?Medications  ?finasteride (PROSCAR) tablet 5 mg (5 mg Oral Given 08/11/21 1510)  ?tamsulosin (FLOMAX) capsule 0.4 mg (has no administration in time range)  ?multivitamin with minerals tablet 1 tablet (1 tablet Oral Given 08/11/21 1513)  ?sodium chloride tablet 2 g (2 g Oral Given 08/11/21 1511)  ?sodium chloride flush (NS) 0.9 % injection 3 mL (3 mLs Intravenous Given 08/11/21 1515)  ?sodium chloride flush (NS) 0.9 % injection 3 mL (3 mLs Intravenous Given 08/11/21 1515)  ?sodium chloride flush (NS) 0.9 % injection 3 mL (has no administration in time range)  ?0.9 %  sodium chloride infusion (has no administration in time range)  ?acetaminophen (TYLENOL) tablet 650 mg (has no administration in time range)  ?  Or  ?acetaminophen (TYLENOL) suppository 650 mg (has no administration in time range)  ?docusate sodium (COLACE) capsule 100 mg (100 mg Oral Patient Refused/Not Given 08/11/21 1513)  ?ondansetron (ZOFRAN) tablet 4 mg (has no administration in time range)  ?  Or  ?ondansetron (ZOFRAN) injection 4 mg (has no administration in time range)  ?albuterol (PROVENTIL) (2.5 MG/3ML) 0.083% nebulizer solution 2.5 mg (2.5 mg Nebulization Given 08/11/21 1529)  ?albuterol (PROVENTIL) (2.5 MG/3ML) 0.083% nebulizer solution 2.5 mg (has no administration in time range)  ?ipratropium (ATROVENT) nebulizer solution 0.5 mg (0.5 mg Nebulization Given 08/11/21 1519)  ?guaiFENesin (MUCINEX) 12 hr tablet 600 mg (600 mg Oral Not Given 08/11/21 1512)  ?hydrALAZINE (APRESOLINE) injection 10 mg (has no administration in time range)  ?dexamethasone  (DECADRON) injection 10 mg (has no administration in time range)  ?benazepril (LOTENSIN) tablet 20 mg (20 mg Oral Given 08/11/21 1511)  ?magnesium oxide (MAG-OX) tablet 400 mg (400 mg Oral Patient Refused/Not Given 08/11/21 1513)  ?pravastatin (PRAVACHOL) tablet 20 mg (20 mg Oral Given 08/11/21 1514)  ?gadobutrol (GADAVIST) 1 MMOL/ML injection 5 mL (has no administration in time range)  ?haloperidol lactate (HALDOL) injection 2 mg (2 mg Intravenous Given  08/11/21 1324)  ?dexamethasone (DECADRON) injection 10 mg (10 mg Intravenous Given 08/11/21 1255)  ?potassium chloride SA (KLOR-CON M) CR tablet 40 mEq (40 mEq Oral Given 08/11/21 1512)  ? ? ? ?IMPRESSION / MDM / ASSESSMENT AND PLAN / ED COURSE  ?I reviewed the triage vital signs and the nursing notes. ?Patient with increasing confusion hyponatremia brain lesion progression of cancer in the lungs he was to start radiation therapy today.  We will have to get him in the hospital to work on his sodium and further evaluate his brain lesion.  I expect that it should be possible to do at least some of his radiation therapy while he is in the hospital.  Patient's urine is clear his white count is okay at 10.8 with 85% polys.  Is still possible that he may have some pneumonia in his lungs but I will leave further evaluation to that with procalcitonin etc. to the hospitalist.  Patient's troponin is somewhat elevated but stable.  His GFR is okay. ? ?The patient is on the cardiac monitor to evaluate for evidence of arrhythmia and/or significant heart rate changes.  None were seen ?Oncology recommended dexamethasone for the brain lesion which I ordered and neurology and hospitalist approved.  I should add to my critical care note above that I also discussed the patient with oncology. ? ? ?FINAL CLINICAL IMPRESSION(S) / ED DIAGNOSES  ? ?Final diagnoses:  ?Altered mental status, unspecified altered mental status type  ?Syncope and collapse  ?Malignant neoplasm of lung, unspecified  laterality, unspecified part of lung ([place])  ?Brain lesion  ? ? ? ?Rx / DC Orders  ? ?ED Discharge Orders   ? ? None  ? ?  ? ? ? ?Note:  This document was prepared using Systems analyst and may include unin

## 2021-08-11 NOTE — ED Notes (Signed)
Patient had 3rd large BM. Complete bed changed done at this time. New gown and brief applied. Bed lowered, locked and call bed within reach. Family at bedside. Patient given dinner tray. No needs expressed to RN at this time. NAD noted. ?

## 2021-08-11 NOTE — Hospital Course (Addendum)
Patient is a 81 years old male with past medical history of diabetes, hyperlipidemia, hypertension, history of lung cancer who follows up with Dr. Grayland Ormond as outpatient, history of chronic hyponatremia presented to hospital with worsening confusion.  Patient has been more confused than usual and had passed out yesterday.  Patient sodium level was 119 and on repeat in the ED was 122.  Patient was then brought into the hospital for altered mental status.  Patient does have a history of lung cancer and was scheduled to undergo radiation treatment starting today but has presented to the hospital at this time.  Patient's daughter at bedside stated that patient was confused lethargic yesterday and had few falls.  He has been having impaired appetite for the last 2 to 3 days.  Normally was able to ambulate by himself but for the last 2 to 3 days has not been able to do much.  There is no mention of nausea vomiting or diarrhea.  No mention of fever chills or rigor.  No mention of increasing cough or shortness of breath/dyspnea.  Denies any urinary urgency frequency dysuria and but not been drinking much recently.  Patient does have history of chronic hyponatremia and was on salt tablets in the past but was not currently taking it as per the primary care physician. ? ?In the ED, patient had stable vitals.  UA showed some glucose but no evidence of infection..  Initial BNP was 122.  Potassium was 3.5.  Creatinine of 0.4.  WBC was mildly elevated at 10.8.  Hemoglobin of 11.8.  Chest x-ray showed extensive interstitial and airspace opacities throughout the right lung significantly progressed from previous PET CT scan on 07/07/2021.  CT chest showed interval increase in the right perihilar mass with small pleural effusion groundglass opacities suspicious for lymphocytic carcinomatosis with osseous metastasis in the manubrium and numerous compression fractures on T1 T4-T5 T7 L1 and L2 with metastatic hilar supraclavicular and  axillary lymphadenopathy.  CT head scan showed progressive expansile and conspicuous hypodensity in the high left parietal lobe.  MRI of the was recommended.  Patient received dexamethasone 10 mg IV in the ED.  Oncology Dr. Tasia Catchings was notified from the ED and patient was consulted for admission to the hospital for further evaluation and treatment. ? ?Assessment and plan. ? ?Principal Problem: ?  Acute metabolic encephalopathy ?Active Problems: ?  Hyponatremia ?  Diabetes mellitus type 2, insulin dependent ([place]) ?  Metastatic lung cancer (metastasis from lung to other site) [facility]) ?  Hypercholesteremia ?  Hypertension ?  ?Altered mental status, metabolic encephalopathy likely secondary to hyponatremia, metastatic cancer with brain metastasis patient does have chronic hyponatremia.  Latest sodium of 122.  Likely secondary to SIADH.  Will check urinary labs osmolality.  We will put the patient on fluid restriction for now.  Dexamethasone 10 mg was given in the ED.  We will continue with that starting tomorrow..  We will hold oxycodone for now.  Patient was on Paxil as outpatient will hold for now.  We will check a serum muscularity urine osmolality urinary sodium and creatinine. ? 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  Patient was supposed to undergo radiation treatment today but has come to the hospital.  Dr. Tasia Catchings oncology was notified.  Patient's primary oncologist is Dr. Grayland Ormond.  We will follow oncology recommendation.  We will consult palliative care with goals of care and extensive metastatic disease.  Patient has extensive tumor burden.  Patient has overall poor prognosis.  Spoke with the patient's daughter at bedside.  She wishes to proceed with MRI of the brain and wants to discuss with oncology about further management plans. ? ?Diabetes mellitus type 2.  Continue sliding scale insulin Accu-Cheks diabetic diet.  Was on  metformin and NovoLog as outpatient.  Will adjust for steroids ? ?Hypertension.  On benazepril and Lasix 10 mg as outpatient.  Continue benazepril.  Hold Lasix. ? ?Hyperlipidemia.  On pravastatin as outpatient, will resume ? ?History of BPH.  On tamsulosin and finasteride.  Will resume. ? ?Spinal metastasis with multiple compression fracture.  We will get PT evaluation.  On oxycodone at home.  Will resume from tomorrow when mentation improves. ? ?Borderline low potassium level.  We will replenish orally.  Check levels in a.m. ?

## 2021-08-12 ENCOUNTER — Ambulatory Visit: Payer: Medicare HMO

## 2021-08-12 ENCOUNTER — Inpatient Hospital Stay: Payer: Medicare HMO

## 2021-08-12 ENCOUNTER — Inpatient Hospital Stay (HOSPITAL_COMMUNITY)
Admit: 2021-08-12 | Discharge: 2021-08-12 | Disposition: A | Discharge status: Home / Self Care | Payer: Medicare HMO | Attending: Student in an Organized Health Care Education/Training Program | Admitting: Student in an Organized Health Care Education/Training Program

## 2021-08-12 ENCOUNTER — Other Ambulatory Visit: Payer: Medicare HMO

## 2021-08-12 ENCOUNTER — Ambulatory Visit: Payer: Medicare HMO | Admitting: Oncology

## 2021-08-12 ENCOUNTER — Telehealth: Payer: Self-pay

## 2021-08-12 DIAGNOSIS — I10 Essential (primary) hypertension: Secondary | ICD-10-CM | POA: Diagnosis not present

## 2021-08-12 DIAGNOSIS — I6389 Other cerebral infarction: Secondary | ICD-10-CM | POA: Diagnosis not present

## 2021-08-12 DIAGNOSIS — I639 Cerebral infarction, unspecified: Secondary | ICD-10-CM | POA: Diagnosis not present

## 2021-08-12 DIAGNOSIS — C3490 Malignant neoplasm of unspecified part of unspecified bronchus or lung: Secondary | ICD-10-CM | POA: Diagnosis not present

## 2021-08-12 DIAGNOSIS — E871 Hypo-osmolality and hyponatremia: Secondary | ICD-10-CM | POA: Diagnosis not present

## 2021-08-12 DIAGNOSIS — G9341 Metabolic encephalopathy: Secondary | ICD-10-CM | POA: Diagnosis not present

## 2021-08-12 LAB — GLUCOSE, CAPILLARY
Glucose-Capillary: 305 mg/dL — ABNORMAL HIGH (ref 70–99)
Glucose-Capillary: 318 mg/dL — ABNORMAL HIGH (ref 70–99)
Glucose-Capillary: 240 mg/dL — ABNORMAL HIGH (ref 70–99)
Glucose-Capillary: 398 mg/dL — ABNORMAL HIGH (ref 70–99)
Glucose-Capillary: 240 mg/dL — ABNORMAL HIGH (ref 70–99)

## 2021-08-12 LAB — COMPREHENSIVE METABOLIC PANEL
ALT: 24 U/L (ref 0–44)
AST: 31 U/L (ref 15–41)
Albumin: 3 g/dL — ABNORMAL LOW (ref 3.5–5.0)
Alkaline Phosphatase: 161 U/L — ABNORMAL HIGH (ref 38–126)
Anion gap: 13 (ref 5–15)
BUN: 11 mg/dL (ref 8–23)
CO2: 22 mmol/L (ref 22–32)
Calcium: 8.2 mg/dL — ABNORMAL LOW (ref 8.9–10.3)
Chloride: 88 mmol/L — ABNORMAL LOW (ref 98–111)
Creatinine, Ser: 0.62 mg/dL (ref 0.61–1.24)
GFR, Estimated: >60 mL/min (ref >60)
Glucose, Bld: 316 mg/dL — ABNORMAL HIGH (ref 70–99)
Potassium: 3.8 mmol/L (ref 3.5–5.1)
Sodium: 123 mmol/L — ABNORMAL LOW (ref 135–145)
Total Bilirubin: 1.6 mg/dL — ABNORMAL HIGH (ref 0.3–1.2)
Total Protein: 5.9 g/dL — ABNORMAL LOW (ref 6.5–8.1)

## 2021-08-12 LAB — CBC
HCT: 30.5 % — ABNORMAL LOW (ref 39.0–52.0)
Hemoglobin: 10.9 g/dL — ABNORMAL LOW (ref 13.0–17.0)
MCH: 34 pg (ref 26.0–34.0)
MCHC: 35.7 g/dL (ref 30.0–36.0)
MCV: 95 fL (ref 80.0–100.0)
Platelets: 196 10*3/uL (ref 150–400)
RBC: 3.21 MIL/uL — ABNORMAL LOW (ref 4.22–5.81)
RDW: 11.9 % (ref 11.5–15.5)
WBC: 9 10*3/uL (ref 4.0–10.5)
nRBC: 0 % (ref 0.0–0.2)

## 2021-08-12 LAB — ECHOCARDIOGRAM COMPLETE
Area-P 1/2: 3.08 cm2
Height: 70 in
P 1/2 time: 403 msec
S' Lateral: 3.1 cm
Weight: 1936.52 oz

## 2021-08-12 LAB — MAGNESIUM: Magnesium: 1.6 mg/dL — ABNORMAL LOW (ref 1.7–2.4)

## 2021-08-12 MED ORDER — ONDANSETRON HCL 4 MG PO TABS: 8.0000 mg | ORAL_TABLET | Freq: Two times a day (BID) | ORAL | Status: DC | PRN

## 2021-08-12 MED ORDER — ALPRAZOLAM 0.25 MG PO TABS
0.2500 mg | ORAL_TABLET | Freq: Three times a day (TID) | ORAL | Status: DC | PRN
  Administered 2021-08-12 – 2021-08-17 (×4): 0.25 mg via ORAL
  Filled 2021-08-12 (×5): qty 1

## 2021-08-12 MED ORDER — OXYCODONE HCL 5 MG PO TABS
10.0000 mg | ORAL_TABLET | Freq: Four times a day (QID) | ORAL | Status: DC | PRN
  Filled 2021-08-12: qty 2

## 2021-08-12 MED ORDER — DEXAMETHASONE 4 MG PO TABS
4.0000 mg | ORAL_TABLET | Freq: Two times a day (BID) | ORAL | Status: DC
  Administered 2021-08-12 – 2021-08-13 (×3): 4 mg via ORAL
  Filled 2021-08-12 (×3): qty 1

## 2021-08-12 MED ORDER — IPRATROPIUM-ALBUTEROL 0.5-2.5 (3) MG/3ML IN SOLN
3.0000 mL | Freq: Two times a day (BID) | RESPIRATORY_TRACT | Status: DC
  Administered 2021-08-12 – 2021-08-17 (×9): 3 mL via RESPIRATORY_TRACT
  Filled 2021-08-12 (×10): qty 3

## 2021-08-12 MED ORDER — MELATONIN 5 MG PO TABS: 5.0000 mg | ORAL_TABLET | Freq: Every evening | ORAL | Status: DC | PRN

## 2021-08-12 MED ORDER — INSULIN GLARGINE-YFGN 100 UNIT/ML ~~LOC~~ SOLN
5.0000 [IU] | Freq: Every day | SUBCUTANEOUS | Status: DC
  Administered 2021-08-12 – 2021-08-14 (×3): 5 [IU] via SUBCUTANEOUS
  Filled 2021-08-12 (×3): qty 0.05

## 2021-08-12 MED ORDER — INSULIN GLARGINE-YFGN 100 UNIT/ML ~~LOC~~ SOLN: 8.0000 [IU] | Freq: Every day | SUBCUTANEOUS | Status: DC

## 2021-08-12 MED ORDER — PAROXETINE HCL 10 MG PO TABS
10.0000 mg | ORAL_TABLET | Freq: Every day | ORAL | Status: DC
Start: 2021-08-12 — End: 2021-08-18
  Administered 2021-08-12 – 2021-08-18 (×7): 10 mg via ORAL
  Filled 2021-08-12 (×8): qty 1

## 2021-08-12 MED ORDER — GABAPENTIN 300 MG PO CAPS
300.0000 mg | ORAL_CAPSULE | Freq: Every day | ORAL | Status: DC
  Administered 2021-08-12 – 2021-08-17 (×6): 300 mg via ORAL
  Filled 2021-08-12 (×6): qty 1

## 2021-08-12 MED ORDER — ENSURE ENLIVE PO LIQD
237.0000 mL | Freq: Three times a day (TID) | ORAL | Status: DC
  Administered 2021-08-12 – 2021-08-18 (×13): 237 mL via ORAL

## 2021-08-12 MED ORDER — ASPIRIN 81 MG PO CHEW: 81.0000 mg | CHEWABLE_TABLET | Freq: Every day | ORAL | Status: DC

## 2021-08-12 MED ORDER — MIRTAZAPINE 15 MG PO TABS
15.0000 mg | ORAL_TABLET | Freq: Every day | ORAL | Status: DC
Start: 2021-08-12 — End: 2021-08-18
  Administered 2021-08-12 – 2021-08-17 (×6): 15 mg via ORAL
  Filled 2021-08-12 (×6): qty 1

## 2021-08-12 MED ORDER — IOHEXOL 350 MG/ML SOLN
75.0000 mL | Freq: Once | INTRAVENOUS | Status: AC | PRN
  Administered 2021-08-12: 75 mL via INTRAVENOUS

## 2021-08-12 MED ORDER — STROKE: EARLY STAGES OF RECOVERY BOOK: Freq: Once | Status: AC

## 2021-08-12 NOTE — Progress Notes (Signed)
?PROGRESS NOTE ? ? ? John Perez  ZOX:[id number] DOB: 28-Mar-1941 DOA: 08/11/2021 ?PCP: Derinda Late, MD  ? ? ?Brief Narrative:  ?81 y.o. male Patient is a 81 years old male with past medical history of diabetes, hyperlipidemia, hypertension, history of lung cancer who follows up with Dr. Grayland Ormond as outpatient, history of chronic hyponatremia presented to hospital with worsening confusion.  Patient has been more confused than usual and had passed out yesterday.  Patient sodium level was 119 and on repeat in the ED was 122.  Patient was then brought into the hospital for altered mental status.  Patient does have a history of lung cancer and was scheduled to undergo radiation treatment starting today but has presented to the hospital at this time.  Patient's daughter at bedside stated that patient was confused lethargic yesterday and had few falls.  He has been having impaired appetite for the last 2 to 3 days.  Normally was able to ambulate by himself but for the last 2 to 3 days has not been able to do much.  There is no mention of nausea vomiting or diarrhea.  No mention of fever chills or rigor.  No mention of increasing cough or shortness of breath/dyspnea.  Denies any urinary urgency frequency dysuria and but not been drinking much recently.  Patient does have history of chronic hyponatremia and was on salt tablets in the past but was not currently taking it as per the primary care physician. ?  ?In the ED, patient had stable vitals.  UA showed some glucose but no evidence of infection..  Initial BNP was 122.  Potassium was 3.5.  Creatinine of 0.4.  WBC was mildly elevated at 10.8.  Hemoglobin of 11.8.  Chest x-ray showed extensive interstitial and airspace opacities throughout the right lung significantly progressed from previous PET CT scan on 07/07/2021.  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  CT head scan showed progressive expansile and conspicuous hypodensity in the high left parietal lobe.  MRI of the was recommended.  Patient received dexamethasone 10 mg IV in the ED.  Oncology Dr. Tasia Catchings was notified from the ED and patient was consulted for admission to the hospital for further evaluation and treatment. ? ?5/3: MRI reviewed.  Oncology and neurology engaged.  MRI findings consistent with CVA.  No clear evidence of leptomeningeal spread or intracranial metastasis.  Mental status weakness improving.  Nephrology engaged for recommendations regarding hyponatremia treatment ? ? ?Assessment & Plan: ?  ?Principal Problem: ?  Acute metabolic encephalopathy ?Active Problems: ?  Hyponatremia ?  Diabetes mellitus type 2, insulin dependent ([place]) ?  Metastatic lung cancer (metastasis from lung to other site) [facility]) ?  Hypercholesteremia ?  Hypertension ?  Altered mental status ?  Malignant neoplasm of lung ([place]) ?  Brain lesion ? ?Acute metabolic encephalopathy ?Severe symptomatic hyponatremia ?Patient does have chronic hyponatremia ?Sodium on presentation 119 ?Started on salt tablets 2 g twice daily ?Serum sodium improving ?Plan: ?Continue salt tabs ?Can continue oxycodone ?Can continue Paxil ?Nephrology engaged for comment ? ?Lung cancer with metastasis ?Patient's primary oncologist is Dr. Grayland Ormond ?Patient with extensive tumor burden ?Overall poor prognosis ?No clear radiographic evidence of cerebral metastasis ?Plan: ?Spoke to daughter at bedside.  She wishes to discuss treatment options with patient's  primary oncologist Dr. Grayland Ormond ? ?Type 2 diabetes mellitus with hyperglycemia ?Sugars have been elevated in the setting of steroid use ?Will continue steroids but taper dose ?Plan: ?Decadron 4 mg twice daily ?Start Semglee 5 units  daily ?Sliding-scale and nightly coverage ? ?Essential hypertension ?Blood pressure controlled ?Continue benazepril ?Hold Lasix ?IV hydralazine as needed ? ?Hyperlipidemia ?PTA pravastatin ? ?BPH ?PTA Flomax and finasteride ? ?Hyponatremia ?Resolved ?Monitor and replace as necessary ? ? ? ?DVT prophylaxis: SQ Lovenox ?Code Status: DNR ?Family Communication: Daughter at bedside 5/3 ?Disposition Plan: Status is: Inpatient ?Remains inpatient appropriate because: Hyponatremia in the setting of metastatic lung CA ? ? ?Level of care: Telemetry Medical ? ?Consultants:  ?Oncology ?Nephrology ?Palliative care ?Neurology ? ?Procedures:  ?None ? ?Antimicrobials: ?None ? ? ?Subjective: ?Seen and examined.  Resting in bed.  No visible distress.  Reports symptomatic improvement since admission. ? ?Objective: ?Vitals:  ? 08/12/21 0981 08/12/21 1914 08/12/21 0701 08/12/21 0815  ?BP:  (!) 143/69 (!) 161/82   ?Pulse:  75 70   ?Resp:  16 16   ?Temp:  98 ?F (36.7 ?C)  (!) 97.4 ?F (36.3 ?C)  ?TempSrc:    Axillary  ?SpO2: 95% 96% 95%   ?Weight:      ?Height:      ? ? ?Intake/Output Summary (Last 24 hours) at 08/12/2021 1409 ?Last data filed at 08/12/2021 1300 ?Gross per 24 hour  ?Intake 340 ml  ?Output --  ?Net 340 ml  ? ?Filed Weights  ? 08/11/21 0834 08/11/21 1933  ?Weight: 54.9 kg 54.9 kg  ? ? ?Examination: ? ?General exam: NAD.  Appears frail ?Respiratory system: Poor respiratory effort.  Decreased breath sounds at bases.  Normal work of breathing.  Room air ?Cardiovascular system: S1-S2, RRR, no murmurs, no pedal edema ?Gastrointestinal system: Soft, NT/ND, normal bowel sounds ?Central nervous system: Alert and oriented. No focal neurological deficits. ?Extremities: Symmetric 5 x 5 power. ?Skin: No rashes, lesions or ulcers ?Psychiatry: Judgement and insight appear normal. Mood & affect appropriate.  ? ? ? ?Data Reviewed: I have personally reviewed following labs and imaging studies ? ?CBC: ?Recent Labs  ?Lab 08/10/21 ?1433  08/11/21 ?7829 08/12/21 ?5621  ?WBC 13.4* 10.8* 9.0  ?NEUTROABS 11.2* 9.2*  --   ?HGB 12.2* 11.8* 10.9*  ?HCT 33.3* 33.4* 30.5*  ?MCV 94.6 97.4 95.0  ?PLT 239 221 196  ? ?Basic Metabolic Panel: ?Recent Labs  ?Lab 08/10/21 ?1433 08/11/21 ?3086 08/12/21 ?5784  ?NA 119* 122* 123*  ?K 4.1 3.5 3.8  ?CL 81* 88* 88*  ?CO2 26 21* 22  ?GLUCOSE 197* 218* 316*  ?BUN 16 11 11   ?CREATININE 0.57* 0.44* 0.62  ?CALCIUM 8.4* 8.0* 8.2*  ?MG  --   --  1.6*  ? ?GFR: ?Estimated Creatinine Clearance: 56.2 mL/min (by C-G formula based on SCr of 0.62 mg/dL). ?Liver Function Tests: ?Recent Labs  ?Lab 08/10/21 ?1433 08/11/21 ?6962 08/12/21 ?9528  ?AST 33 31 31  ?ALT 24 22 24   ?ALKPHOS 192* 167* 161*  ?BILITOT 1.4* 1.4* 1.6*  ?PROT 6.3* 6.0* 5.9*  ?ALBUMIN 3.0* 2.9* 3.0*  ? ?No results for input(s): LIPASE, AMYLASE in the last 168 hours. ?No results for input(s): AMMONIA in the last 168 hours. ?Coagulation Profile: ?No results for input(s): INR, PROTIME in the last 168 hours. ?Cardiac Enzymes: ?No results for input(s): CKTOTAL, CKMB, CKMBINDEX, TROPONINI in the last 168 hours. ?BNP (last 3 results) ?No results for input(s): PROBNP in the last 8760 hours. ?HbA1C: ?No results for input(s):  HGBA1C in the last 72 hours. ?CBG: ?Recent Labs  ?Lab 08/11/21 ?2217 08/12/21 ?2458 08/12/21 ?1151  ?GLUCAP 349* 305* 318*  ? ?Lipid Profile: ?No results for input(s): CHOL, HDL, LDLCALC, TRIG, CHOLHDL, LDLDIRECT in the last 72 hours. ?Thyroid Function Tests: ?No results for input(s): TSH, T4TOTAL, FREET4, T3FREE, THYROIDAB in the last 72 hours. ?Anemia Panel: ?No results for input(s): VITAMINB12, FOLATE, FERRITIN, TIBC, IRON, RETICCTPCT in the last 72 hours. ?Sepsis Labs: ?No results for input(s): PROCALCITON, LATICACIDVEN in the last 168 hours. ? ?No results found for this or any previous visit (from the past 240 hour(s)).  ? ? ? ? ? ?Radiology Studies: ?CT Head Wo Contrast ? ?Addendum Date: 08/11/2021   ?ADDENDUM REPORT: 08/11/2021 09:47 ADDENDUM: Findings  discussed with provider Malinda via telephone at 9:42 a.m. Electronically Signed   By: Margaretha Sheffield M.D.   On: 08/11/2021 09:47  ? ?Result Date: 08/11/2021 ?CLINICAL DATA:  Mental status change, unknown cause EXAM: CT HEAD [facility]

## 2021-08-12 NOTE — [facility] Oncology Navigator Note (Signed)
? ?Hematology/Oncology Progress note ?[phone] [phone] ?  ? ? ?Patient Care Team: ?Derinda Late, MD as PCP - General (Family Medicine) ?Lloyd Huger, MD as Consulting Physician (Oncology) ?Telford Nab, RN as Sales executive  ? ?Name of the patient: John Perez  ?[id number]  ?1941/02/28  ?Date of visit: 08/12/21 ? ? ?INTERVAL HISTORY-  ?Patient sits in the chair.  No acute events.  Daughter is at the bedside. ? ?08/12/2021, MRI brain with contrast showed mild leptomeningeal contrast-enhancement over the left occipital lobe at the site of the earlier demonstrated infarct.  This is in keeping with subacute ischemia and a blood brain barrier breakdown. ? ? ?Current Facility-Administered Medications:  ?  0.9 %  sodium chloride infusion, 250 mL, Intravenous, PRN, Pokhrel, Laxman, MD ?  acetaminophen (TYLENOL) tablet 650 mg, 650 mg, Oral, Q6H PRN **OR** acetaminophen (TYLENOL) suppository 650 mg, 650 mg, Rectal, Q6H PRN, Pokhrel, Laxman, MD ?  albuterol (PROVENTIL) (2.5 MG/3ML) 0.083% nebulizer solution 2.5 mg, 2.5 mg, Nebulization, Q2H PRN, Pokhrel, Laxman, MD ?  benazepril (LOTENSIN) tablet 20 mg, 20 mg, Oral, Daily, Pokhrel, Laxman, MD, 20 mg at 08/12/21 0801 ?  docusate sodium (COLACE) capsule 100 mg, 100 mg, Oral, BID, Pokhrel, Laxman, MD, 100 mg at 08/11/21 2211 ?  finasteride (PROSCAR) tablet 5 mg, 5 mg, Oral, Daily, Pokhrel, Laxman, MD, 5 mg at 08/12/21 0802 ?  guaiFENesin (MUCINEX) 12 hr tablet 600 mg, 600 mg, Oral, BID, Pokhrel, Laxman, MD, 600 mg at 08/12/21 0801 ?  hydrALAZINE (APRESOLINE) injection 10 mg, 10 mg, Intravenous, Q6H PRN, Pokhrel, Laxman, MD ?  insulin aspart (novoLOG) injection 0-5 Units, 0-5 Units, Subcutaneous, QHS, Foust, Katy L, NP, 4 Units at 08/11/21 2221 ?  insulin aspart (novoLOG) injection 0-9 Units, 0-9 Units, Subcutaneous, TID WC, Foust, Katy L, NP, 7 Units at 08/12/21 1248 ?  ipratropium-albuterol (DUONEB) 0.5-2.5 (3) MG/3ML nebulizer  solution 3 mL, 3 mL, Nebulization, BID, Sreenath, Sudheer B, MD ?  magnesium oxide (MAG-OX) tablet 400 mg, 400 mg, Oral, Daily, Pokhrel, Laxman, MD, 400 mg at 08/12/21 0801 ?  multivitamin with minerals tablet 1 tablet, 1 tablet, Oral, Daily, Pokhrel, Laxman, MD, 1 tablet at 08/12/21 0802 ?  ondansetron (ZOFRAN) tablet 4 mg, 4 mg, Oral, Q6H PRN **OR** ondansetron (ZOFRAN) injection 4 mg, 4 mg, Intravenous, Q6H PRN, Pokhrel, Laxman, MD ?  pravastatin (PRAVACHOL) tablet 20 mg, 20 mg, Oral, Daily, Pokhrel, Laxman, MD, 20 mg at 08/12/21 0802 ?  sodium chloride flush (NS) 0.9 % injection 3 mL, 3 mL, Intravenous, Q12H, Pokhrel, Laxman, MD, 3 mL at 08/11/21 1515 ?  sodium chloride flush (NS) 0.9 % injection 3 mL, 3 mL, Intravenous, Q12H, Pokhrel, Laxman, MD, 3 mL at 08/12/21 0813 ?  sodium chloride flush (NS) 0.9 % injection 3 mL, 3 mL, Intravenous, PRN, Pokhrel, Laxman, MD ?  sodium chloride tablet 2 g, 2 g, Oral, BID WC, Pokhrel, Laxman, MD, 2 g at 08/12/21 0752 ?  tamsulosin (FLOMAX) capsule 0.4 mg, 0.4 mg, Oral, QPC supper, Pokhrel, Laxman, MD, 0.4 mg at 08/11/21 1721 ? ? ?Physical exam:  ?Vitals:  ? 08/12/21 0223 08/12/21 0511 08/12/21 0701 08/12/21 0815  ?BP:  (!) 143/69 (!) 161/82   ?Pulse:  75 70   ?Resp:  16 16   ?Temp:  98 ?F (36.7 ?C)  (!) 97.4 ?F (36.3 ?C)  ?TempSrc:    Axillary  ?SpO2: 95% 96% 95%   ?Weight:      ?Height:      ? ?  Physical Exam ?Constitutional:   ?   General: He is not in acute distress. ?HENT:  ?   Head: Normocephalic and atraumatic.  ?   Nose: Nose normal.  ?   Mouth/Throat:  ?   Pharynx: No oropharyngeal exudate.  ?Eyes:  ?   General: No scleral icterus. ?   Pupils: Pupils are equal, round, and reactive to light.  ?Cardiovascular:  ?   Rate and Rhythm: Normal rate and regular rhythm.  ?   Heart sounds: No murmur heard. ?Pulmonary:  ?   Effort: Pulmonary effort is normal.  ?Abdominal:  ?   General: There is no distension.  ?Musculoskeletal:     ?   General: Normal range of motion.  ?    Cervical back: Normal range of motion and neck supple.  ?Skin: ?   General: Skin is warm and dry.  ?   Findings: No erythema.  ?Neurological:  ?   Mental Status: He is alert. Mental status is at baseline.  ?   Motor: No abnormal muscle tone.  ?   Comments: Patient moves all 4 extremities  ?Psychiatric:     ?   Mood and Affect: Mood and affect normal.  ?  ? ? ? ? ?  Latest Ref Rng & Units 08/12/2021  ?  5:37 AM  ?CMP  ?Glucose 70 - 99 mg/dL 316    ?BUN 8 - 23 mg/dL 11    ?Creatinine 0.61 - 1.24 mg/dL 0.62    ?Sodium 135 - 145 mmol/L 123    ?Potassium 3.5 - 5.1 mmol/L 3.8    ?Chloride 98 - 111 mmol/L 88    ?CO2 22 - 32 mmol/L 22    ?Calcium 8.9 - 10.3 mg/dL 8.2    ?Total Protein 6.5 - 8.1 g/dL 5.9    ?Total Bilirubin 0.3 - 1.2 mg/dL 1.6    ?Alkaline Phos 38 - 126 U/L 161    ?AST 15 - 41 U/L 31    ?ALT 0 - 44 U/L 24    ? ? ?  Latest Ref Rng & Units 08/12/2021  ?  5:37 AM  ?CBC  ?WBC 4.0 - 10.5 K/uL 9.0    ?Hemoglobin 13.0 - 17.0 g/dL 10.9    ?Hematocrit 39.0 - 52.0 % 30.5    ?Platelets 150 - 400 K/uL 196    ? ? ?RADIOGRAPHIC STUDIES: ?I have personally reviewed the radiological images as listed and agreed with the findings in the report. ?CT Head Wo Contrast ? ?Addendum Date: 08/11/2021   ?ADDENDUM REPORT: 08/11/2021 09:47 ADDENDUM: Findings discussed with provider Malinda via telephone at 9:42 a.m. Electronically Signed   By: Margaretha Sheffield M.D.   On: 08/11/2021 09:47  ? ?Result Date: 08/11/2021 ?CLINICAL DATA:  Mental status change, unknown cause EXAM: CT HEAD WITHOUT CONTRAST TECHNIQUE: Contiguous axial images were obtained from the base of the skull through the vertex without intravenous contrast. RADIATION DOSE REDUCTION: This exam was performed according to the departmental dose-optimization program which includes automated exposure control, adjustment of the mA and/or kV according to patient size and/or use of iterative reconstruction technique. COMPARISON:  CT head June 09, 2021. FINDINGS: Brain: Progressively  expansile and conspicuous hypodensity in the high left parietal lobe, now involving cortex and subcortical white matter. Question petechial hemorrhage versus spared cortex in this region. No mass occupying acute hemorrhage. No midline shift. No hydrocephalus. Mildly prominent retro cerebellar CSF, similar. Vascular: Major arterial flow voids are maintained at the skull base. Calcific intracranial  atherosclerosis. Skull: No acute fracture. Sinuses/Orbits: Clear sinuses.  Unremarkable orbits. Other: No mastoid effusions. IMPRESSION: Progressively expansile and conspicuous hypodensity in the high left parietal lobe, now involving cortex and subcortical white matter. Question petechial hemorrhage versus spared cortex in this region. Findings are suspicious for malignancy and/or interval acute or subacute infarct. Recommend MRI with contrast to further evaluate. Electronically Signed: By: Margaretha Sheffield M.D. On: 08/11/2021 09:26  ? ?CT Chest Wo Contrast ? ?Result Date: 08/11/2021 ?CLINICAL DATA:  Worsening radiograph with possible pneumonia Increasing confusion and multiple falls EXAM: CT CHEST WITHOUT CONTRAST TECHNIQUE: Multidetector CT imaging of the chest was performed following the standard protocol without IV contrast. RADIATION DOSE REDUCTION: This exam was performed according to the departmental dose-optimization program which includes automated exposure control, adjustment of the mA and/or kV according to patient size and/or use of iterative reconstruction technique. COMPARISON:  Chest radiograph 08/11/2021 PET CT 06/29/2021 FINDINGS: Cardiovascular: Heart size is within normal limits. Small pericardial effusion is present. Extensive coronary artery calcifications are again seen. Mediastinum/Nodes: Evaluation for adenopathy limited due to lack of IV contrast. Supraclavicular, mediastinal, and right hilar adenopathy does not appear significantly changed compared to prior CT from 06/16/2021. Mildly enlarged left  axillary lymph node is not significantly changed compared to PET CT from 07/07/2021. Lungs/Pleura: Interval development of small right pleural effusion. Interval worsening of septal thickening and ground-glass opa

## 2021-08-12 NOTE — Progress Notes (Signed)
?[name]  ?ROUNDING NOTE  ? ?Subjective:  ? ?John Perez is a 81 year old male with past medical conditions including hyperlipidemia, diabetes, hypertension, lung cancer, and chronic hyponatremia.  Patient presents to the emergency department with increased weakness and confusion.  Patient has been admitted for Syncope and collapse [R55] ?Brain lesion [G93.9] ?Malignant neoplasm of lung, unspecified laterality, unspecified part of lung ([place]) [C34.90] ?Altered mental status, unspecified altered mental status type [R41.82] ?Acute metabolic encephalopathy [D32.67] ? ?Patient is known to our practice from previous admissions for the same concern.  Patient has failed to follow-up with scheduled office visits.  Patient is seen sitting up in chair, daughter at bedside.  Daughter states that the weakness began a few days prior followed by confusion.  She states patient has maintained appropriate appetite up until 1 week prior.  She does report patient ate well this morning for breakfast.  Denies nausea, vomiting, diarrhea.  Does report shortness of breath with exertion.  Denies pain or discomfort.  States he was discharged with sodium tabs during prior admission and maintained that prescription until 4 weeks ago.  He was taken off sodium tablets due to improved sodium levels.  He has suffered a few falls prior to admission. ? ?Labs on ED arrival include sodium 119, glucose 197, creatinine 0.57 with GFR greater than 60, calcium 8.4 and albumin 3.0.  Elevated white blood cells 13.4 with hemoglobin 12.2.  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 ? ?We have been consulted to assist in management of hyponatremia ? ? ?Objective:  ?Vital signs in last 24 hours:  ?Temp:  [97.4 ?F (36.3 ?C)-98.6 ?F (37 ?C)] 97.4 ?F (36.3 ?C) (05/03 0815) ?Pulse Rate:  [70-105] 70 (05/03 0701) ?Resp:  [16-20] 16  (05/03 0701) ?BP: (132-183)/(67-90) 161/82 (05/03 0701) ?SpO2:  [91 %-96 %] 95 % (05/03 0701) ?Weight:  [54.9 kg] 54.9 kg (05/02 1933) ? ?Weight change:  ?Filed Weights  ? 08/11/21 0834 08/11/21 1933  ?Weight: 54.9 kg 54.9 kg  ? ? ?Intake/Output: ?I/O last 3 completed shifts: ?In: 100 [P.O.:100] ?Out: -  ?  ?Intake/Output this shift: ? No intake/output data recorded. ? ?Physical Exam: ?General: NAD, sitting in chair  ?Head: Normocephalic, atraumatic. Moist oral mucosal membranes  ?Eyes: Anicteric  ?Lungs:  Clear to auscultation, normal effort, room air  ?Heart: Regular rate and rhythm  ?Abdomen:  Soft, nontender, nondistended  ?Extremities: No peripheral edema.  ?Neurologic: Nonfocal, moving all four extremities  ?Skin: No lesions  ?Access: None  ? ? ?Basic Metabolic Panel: ?Recent Labs  ?Lab 08/10/21 ?1433 08/11/21 ?1245 08/12/21 ?8099  ?NA 119* 122* 123*  ?K 4.1 3.5 3.8  ?CL 81* 88* 88*  ?CO2 26 21* 22  ?GLUCOSE 197* 218* 316*  ?BUN 16 11 11   ?CREATININE 0.57* 0.44* 0.62  ?CALCIUM 8.4* 8.0* 8.2*  ?MG  --   --  1.6*  ? ? ?Liver Function Tests: ?Recent Labs  ?Lab 08/10/21 ?1433 08/11/21 ?8338 08/12/21 ?2505  ?AST 33 31 31  ?ALT 24 22 24   ?ALKPHOS 192* 167* 161*  ?BILITOT 1.4* 1.4* 1.6*  ?PROT 6.3* 6.0* 5.9*  ?ALBUMIN 3.0* 2.9* 3.0*  ? ?No results for input(s): LIPASE, AMYLASE in the last 168 hours. ?No results for input(s): AMMONIA in the last 168 hours. ? ?CBC: ?Recent Labs  ?Lab 08/10/21 ?1433 08/11/21 ?3976 08/12/21 ?7341  ?WBC 13.4* 10.8* 9.0  ?NEUTROABS 11.2* 9.2*  --   ?  HGB 12.2* 11.8* 10.9*  ?HCT 33.3* 33.4* 30.5*  ?MCV 94.6 97.4 95.0  ?PLT 239 221 196  ? ? ?Cardiac Enzymes: ?No results for input(s): CKTOTAL, CKMB, CKMBINDEX, TROPONINI in the last 168 hours. ? ?BNP: ?Invalid input(s): POCBNP ? ?CBG: ?Recent Labs  ?Lab 08/11/21 ?2217 08/12/21 ?1062 08/12/21 ?1151  ?GLUCAP 349* 305* 318*  ? ? ?Microbiology: ?Results for orders placed or performed during the hospital encounter of 06/10/21  ?Urine Culture      Status: Abnormal  ? Collection Time: 06/10/21  3:42 PM  ? Specimen: Urine, Random  ?Result Value Ref Range Status  ? Specimen Description   Final  ?  URINE, RANDOM ?Performed at [facility], [address] [place], [place] [address] ?  ? Special Requests   Final  ?  Normal ?Performed at [facility], [address] [place], [place] [address] ?  ? Culture >=100,000 COLONIES/mL SERRATIA MARCESCENS (A)  Final  ? Report Status 06/13/2021 FINAL  Final  ? Organism ID, Bacteria SERRATIA MARCESCENS (A)  Final  ?    Susceptibility  ? Serratia marcescens - MIC*  ?  CEFAZOLIN >=64 RESISTANT Resistant   ?  CEFEPIME <=0.12 SENSITIVE Sensitive   ?  CEFTRIAXONE <=0.25 SENSITIVE Sensitive   ?  CIPROFLOXACIN <=0.25 SENSITIVE Sensitive   ?  GENTAMICIN <=1 SENSITIVE Sensitive   ?  NITROFURANTOIN 256 RESISTANT Resistant   ?  TRIMETH/SULFA <=20 SENSITIVE Sensitive   ?  * >=100,000 COLONIES/mL SERRATIA MARCESCENS  ?Resp Panel by RT-PCR (Flu A&B, Covid) Nasopharyngeal Swab     Status: None  ? Collection Time: 06/10/21  4:41 PM  ? Specimen: Nasopharyngeal Swab; Nasopharyngeal(NP) swabs in vial transport medium  ?Result Value Ref Range Status  ? SARS Coronavirus 2 by RT PCR NEGATIVE NEGATIVE Final  ?  Comment: (NOTE) ?SARS-CoV-2 target nucleic acids are NOT DETECTED. ? ?The SARS-CoV-2 RNA is generally detectable in upper respiratory ?specimens during the acute phase of infection. The lowest ?concentration of SARS-CoV-2 viral copies this assay can detect is ?138 copies/mL. A negative result does not preclude SARS-Cov-2 ?infection and should not be used as the sole basis for treatment or ?other patient management decisions. A negative result may occur with  ?improper specimen collection/handling, submission of specimen other ?than nasopharyngeal swab, presence of viral mutation(s) within the ?areas targeted by this assay, and inadequate number of viral ?copies(<138 copies/mL). A negative result must be combined  with ?clinical observations, patient history, and epidemiological ?information. The expected result is Negative. ? ?Fact Sheet for Patients:  ?EntrepreneurPulse.com.au ? ?Fact Sheet for Healthcare Providers:  ?IncredibleEmployment.be ? ?This test is no t yet approved or cleared by the [place] FDA and  ?has been authorized for detection and/or diagnosis of SARS-CoV-2 by ?FDA under an Emergency Use Authorization (EUA). This EUA will remain  ?in effect (meaning this test can be used) for the duration of the ?COVID-19 declaration under Section 564(b)(1) of the Act, 21 ?U.S.C.section 360bbb-3(b)(1), unless the authorization is terminated  ?or revoked sooner.  ? ? ?  ? Influenza A by PCR NEGATIVE NEGATIVE Final  ? Influenza B by PCR NEGATIVE NEGATIVE Final  ?  Comment: (NOTE) ?The Xpert Xpress SARS-CoV-2/FLU/RSV plus assay is intended as an aid ?in the diagnosis of influenza from Nasopharyngeal swab specimens and ?should not be used as a sole basis for treatment. Nasal washings and ?aspirates are unacceptable for Xpert Xpress SARS-CoV-2/FLU/RSV ?testing. ? ?Fact Sheet for Patients: ?EntrepreneurPulse.com.au ? ?Fact Sheet for Healthcare Providers: ?IncredibleEmployment.be ? ?  This test is not yet approved or cleared by the [place] FDA and ?has been authorized for detection and/or diagnosis of SARS-CoV-2 by ?FDA under an Emergency Use Authorization (EUA). This EUA will remain ?in effect (meaning this test can be used) for the duration of the ?COVID-19 declaration under Section 564(b)(1) of the Act, 21 U.S.C. ?section 360bbb-3(b)(1), unless the authorization is terminated or ?revoked. ? ?Performed at [facility], [place], ?[place] [address] ?  ? ? ?Coagulation Studies: ?No results for input(s): LABPROT, INR in the last 72 hours. ? ?Urinalysis: ?Recent Labs  ?  08/11/21 ?1044  ?COLORURINE YELLOW*  ?LABSPEC 1.015   ?PHURINE 6.0  ?GLUCOSEU 50*  ?HGBUR NEGATIVE  ?BILIRUBINUR NEGATIVE  ?KETONESUR 20*  ?PROTEINUR NEGATIVE  ?NITRITE NEGATIVE  ?LEUKOCYTESUR NEGATIVE  ?  ? ? ?Imaging: ?CT Head Wo Contrast ? ?Addendum Date: 08/11/2021

## 2021-08-12 NOTE — Progress Notes (Signed)
Inpatient Diabetes Program Recommendations ? ?AACE/ADA: New Consensus Statement on Inpatient Glycemic Control (2015) ? ?Target Ranges:  Prepandial:   less than 140 mg/dL ?     Peak postprandial:   less than 180 mg/dL (1-2 hours) ?     Critically ill patients:  140 - 180 mg/dL  ? ? Latest Reference Range & Units 08/11/21 22:17 08/12/21 08:12  ?Glucose-Capillary 70 - 99 mg/dL 349 (H) ? ?4 units Novolog 305 (H) ? ?7 units Novolog ?  ? ? ? ?Home DM Meds: Metformin 1000 mg BID  ?   Novolog SSI TID (see PCP notes 07/28/2021) ? ? ?Current Orders: Novolog 0-9 units TID ac/hs  ? ? ?Got 10 mg Decadron yest at 1pm and now getting Decadron 4 mg Q6H ? ? ? ?MD- Note CBGs >300 likely due to Decadron ? ?If pt to remain on Decadron today, please consider starting Semglee 8 units Daily (0.15 units/kg) ? ? ? ?--Will follow patient during hospitalization-- ? ?Wyn Quaker RN, MSN, CDE ?Diabetes Coordinator ?Inpatient Glycemic Control Team ?Team Pager: [phone] (8a-5p) ? ? ? ? ?

## 2021-08-12 NOTE — Progress Notes (Signed)
Initial Nutrition Assessment ? ?DOCUMENTATION CODES:  ? ?Underweight ? ?INTERVENTION:  ? ?-Ensure Enlive po TID, each supplement provides 350 kcal and 20 grams of protein ?-MVI with minerals daily ? ?NUTRITION DIAGNOSIS:  ? ?Increased nutrient needs related to cancer and cancer related treatments as evidenced by estimated needs. ? ?GOAL:  ? ?Patient will meet greater than or equal to 90% of their needs ? ?MONITOR:  ? ?PO intake, Supplement acceptance ? ?REASON FOR ASSESSMENT:  ? ?Malnutrition Screening Tool ?  ? ?ASSESSMENT:  ? ?Pt with past medical history of diabetes, hyperlipidemia, hypertension, history of lung cancer who follows up with Dr. Grayland Ormond as outpatient, history of chronic hyponatremia presented to hospital with worsening confusion. ? ?Pt admitted with AMS, encephalopathy, and metastatic brain cancer.  ? ?5/2- MRI brain revealed lt occipital lesion, acute/ subacute infarct and possible tumor related hemorrhage ? ?Reviewed I/O's: +100 ml x 24 hours ? ?Pt unavailable at time of visit. RD unable to obtain further nutrition-related history or complete nutrition-focused physical exam at this time.    ? ?Palliative care following. Per notes, pt is waiting for oncology consult to determine further goals of care.  ? ?Per H&P, pt with poor oral intake. No meal completion data available to assess at this time.  ? ?Reviewed wt hx; pt has experienced a 21.6% wt loss over the past year, which is significant for time frame. Highly suspect pt with malnutrition, however, unable to identify at this time.  ? ?Pt with poor oral intake and would benefit from nutrient dense supplement. One Ensure Enlive supplement provides 350 kcals, 20 grams protein, and 44-45 grams of carbohydrate vs one Glucerna shake supplement, which provides 220 kcals, 10 grams of protein, and 26 grams of carbohydrate. Given pt's hx of DM, RD will reassess adequacy of PO intake, CBGS, and adjust supplement regimen as appropriate at follow-up.    ? ?Medications reviewed and include decadron, remeron, and colace.   ? ?Suspect blood sugars are elevated secondary to steroids. Noted diabetes coordinator recommend 8 units insulin glargine-yfgn daily.  ? ?Lab Results  ?Component Value Date  ? HGBA1C 6.6 (H) 06/10/2021  ? PTA DM medications are 1000 mg metformin BID.  ? ?Labs reviewed: Na: 123, Mg: 1.6,  CBGS: 676-195 (inpatient orders for glycemic control are 0-5 units insulin aspart daily at bedtime and 0-9 units insulin aspart TID with meals).   ? ?Diet Order:   ?Diet Order   ? ?       ?  Diet Carb Modified Fluid consistency: Thin; Room service appropriate? Yes  Diet effective now       ?  ? ?  ?  ? ?  ? ? ?EDUCATION NEEDS:  ? ?No education needs have been identified at this time ? ?Skin:  Skin Assessment: Skin Integrity Issues: ?Skin Integrity Issues:: Other (Comment) ?Other: rt arm skin tear ? ?Last BM:  08/11/21 ? ?Height:  ? ?Ht Readings from Last 1 Encounters:  ?08/11/21 5\' 10"  (1.778 m)  ? ? ?Weight:  ? ?Wt Readings from Last 1 Encounters:  ?08/11/21 54.9 kg  ? ? ?Ideal Body Weight:  75.5 kg ? ?BMI:  Body mass index is 17.37 kg/m?. ? ?Estimated Nutritional Needs:  ? ?Kcal:  1700-1900 ? ?Protein:  90-105 grams ? ?Fluid:  > 1.7 L ? ? ? ?Loistine Chance, RD, LDN, CDCES ?Registered Dietitian II ?Certified Diabetes Care and Education Specialist ?Please refer to [facility] for RD and/or RD on-call/weekend/after hours pager  ?

## 2021-08-12 NOTE — Evaluation (Signed)
Physical Therapy Evaluation ?Patient Details ?Name: John Perez ?MRN: [id number] ?DOB: [birth year] ?Today's Date: 08/12/2021 ? ?History of Present Illness ? Pt is admitted for acute metabolic encephalopathy with acute/subacute L occipital and parietal lobe infarct. Of note, also with + lung mass and recent falls. Other history includes DM, HLD, and HTN.  ?Clinical Impression ? Pt is a pleasant 81 year old male who was admitted for acute metabolic encephalopathy. Pt performs bed mobility/transfers with mod I and ambulation with cga and RW. Pt very confused and requires cues for sequencing or RW. Pt beginning to become agitated as he wants to leave the hospital ASAP. Per daughter, has been walking in hallway earlier this date along with sitting in recliner. Pt demonstrates deficits with cognition/mobility. Would benefit from skilled PT to address above deficits and promote optimal return to PLOF. Recommend transition to [place] upon discharge from acute hospitalization. ?  ?   ? ?Recommendations for follow up therapy are one component of a multi-disciplinary discharge planning process, led by the attending physician.  Recommendations may be updated based on patient status, additional functional criteria and insurance authorization. ? ?Follow Up Recommendations Home health PT ? ?  ?Assistance Recommended at Discharge Frequent or constant Supervision/Assistance  ?Patient can return home with the following ? A little help with walking and/or transfers;A little help with bathing/dressing/bathroom;Assist for transportation ? ?  ?Equipment Recommendations None recommended by PT  ?Recommendations for Other Services ?    ?  ?Functional Status Assessment Patient has had a recent decline in their functional status and demonstrates the ability to make significant improvements in function in a reasonable and predictable amount of time.  ? ?  ?Precautions / Restrictions Precautions ?Precautions: Fall ?Restrictions ?Weight Bearing  Restrictions: No  ? ?  ? ?Mobility ? Bed Mobility ?Overal bed mobility: Modified Independent ?  ?  ?  ?  ?  ?  ?General bed mobility comments: safe technique with ease of mobility. ?  ? ?Transfers ?Overall transfer level: Modified independent ?Equipment used: Rolling walker (2 wheels) ?  ?  ?  ?  ?  ?  ?  ?General transfer comment: safe technique, although impulsive. RW used. Received standing in room ?  ? ?Ambulation/Gait ?Ambulation/Gait assistance: Min guard ?Gait Distance (Feet): 200 Feet ?Assistive device: Rolling walker (2 wheels) ?Gait Pattern/deviations: Step-through pattern ?  ?  ?  ?General Gait Details: ambulated in hallway with reciprocal gait pattern. Shuffle gait pattern noted, however no formal LOB. Very confused, however follows commands well. ? ?Stairs ?  ?  ?  ?  ?  ? ?Wheelchair Mobility ?  ? ?Modified Rankin (Stroke Patients Only) ?  ? ?  ? ?Balance Overall balance assessment: History of Falls, Needs assistance ?Sitting-balance support: Feet supported ?Sitting balance-Leahy Scale: Good ?  ?  ?Standing balance support: Bilateral upper extremity supported ?Standing balance-Leahy Scale: Fair ?  ?  ?  ?  ?  ?  ?  ?  ?  ?  ?  ?  ?   ? ? ? ?Pertinent Vitals/Pain Pain Assessment ?Pain Assessment: No/denies pain  ? ? ?Home Living Family/patient expects to be discharged to:: Private residence ?Living Arrangements: Spouse/significant other ?Available Help at Discharge: Family;Friend(s);Available 24 hours/day ?Type of Home: House ?Home Access: Stairs to enter ?Entrance Stairs-Rails: Can reach both;Right;Left ?Entrance Stairs-Number of Steps: 3 ?Alternate Level Stairs-Number of Steps: 15 ?Home Layout: Multi-level;Able to live on main level with bedroom/bathroom ?Home Equipment: Grab bars - tub/shower;Hand held shower head;Rolling Walker (2 wheels) ?   ?  ?  Prior Function Prior Level of Function : Independent/Modified Independent ?  ?  ?  ?  ?  ?  ?Mobility Comments: reports he was using RW most recently,  previously indep a few months ago. ?ADLs Comments: Pt is poor historian, most of history obtained from daughter in room ?  ? ? ?Hand Dominance  ?   ? ?  ?Extremity/Trunk Assessment  ? Upper Extremity Assessment ?Upper Extremity Assessment: Overall WFL for tasks assessed ?  ? ?Lower Extremity Assessment ?Lower Extremity Assessment: Generalized weakness (B LE grossly 4/5) ?  ? ?   ?Communication  ? Communication: No difficulties  ?Cognition Arousal/Alertness: Awake/alert ?Behavior During Therapy: [facility] for tasks assessed/performed ?Overall Cognitive Status: Impaired/Different from baseline ?  ?  ?  ?  ?  ?  ?  ?  ?  ?  ?  ?  ?  ?  ?  ?  ?General Comments: Pt is awake, however confused and appears agitated at times as he wants to leave the hospital. RN aware and in room to give meds ?  ?  ? ?  ?General Comments   ? ?  ?Exercises    ? ?Assessment/Plan  ?  ?PT Assessment Patient needs continued PT services  ?PT Problem List Decreased cognition;Decreased strength;Decreased balance;Decreased mobility;Decreased knowledge of use of DME;Decreased safety awareness ? ?   ?  ?PT Treatment Interventions Gait training;DME instruction;Therapeutic exercise;Balance training   ? ?PT Goals (Current goals can be found in the Care Plan section)  ?Acute Rehab PT Goals ?Patient Stated Goal: to leave the hospital right now ?PT Goal Formulation: Patient unable to participate in goal setting ?Time For Goal Achievement: Sep 10, 2021 ?Potential to Achieve Goals: Fair ? ?  ?Frequency Min 2X/week ?  ? ? ?Co-evaluation   ?  ?  ?  ?  ? ? ?  ?AM-PAC PT "6 Clicks" Mobility  ?Outcome Measure Help needed turning from your back to your side while in a flat bed without using bedrails?: None ?Help needed moving from lying on your back to sitting on the side of a flat bed without using bedrails?: None ?Help needed moving to and from a bed to a chair (including a wheelchair)?: None ?Help needed standing up from a chair using your arms (e.g., wheelchair or bedside  chair)?: None ?Help needed to walk in hospital room?: A Little ?Help needed climbing 3-5 steps with a railing? : A Little ?6 Click Score: 22 ? ?  ?End of Session Equipment Utilized During Treatment: Gait belt ?Activity Tolerance: Patient tolerated treatment well ?Patient left: in bed;with bed alarm set;with family/visitor present (RN in room admin meds) ?Nurse Communication: Mobility status ?PT Visit Diagnosis: Unsteadiness on feet (R26.81);Muscle weakness (generalized) (M62.81);History of falling (Z91.81);Difficulty in walking, not elsewhere classified (R26.2) ?  ? ?Time: 7035-0093 ?PT Time Calculation (min) (ACUTE ONLY): 14 min ? ? ?Charges:   PT Evaluation ?$PT Eval Low Complexity: 1 Low ?PT Treatments ?$Gait Training: 8-22 mins ?  ?   ? ? ?Greggory Stallion, PT, DPT, GCS ?[phone] ? ? ?Alicya Bena ?08/12/2021, 4:40 PM ? ?

## 2021-08-12 NOTE — Progress Notes (Signed)
? ?                                                                                                                                                     ?                                                   ?Daily Progress Note  ? ?Patient Name: John Perez       Date: 08/12/2021 ?DOB: May 28, 1940  Age: 81 y.o. MRN#: [id number] ?Attending Physician: Sidney Ace, MD ?Primary Care Physician: Derinda Late, MD ?Admit Date: 08/11/2021 ? ?Reason for Consultation/Follow-up: Establishing goals of care ? ?Subjective: ?Notes and labs reviewed. In to see patient and daughter is at bedside-- neurology is finishing evaluation and conversation with patient and family.  ? ?They are able to accurately articulate his current issues and treatment plan. They discuss that ultimately, they are waiting for Dr. Grayland Ormond to see them tomorrow, to determine recommendations on care moving forward. They discuss how well he was doing prior to the last week. He discusses significant back pain that required Oxycodone, but states with his currently ordered steroid, he has not needed the Oxycodone.  ? ? I will follow up Friday with them. Would recommend Billey Chang NP with oncology palliative to see patient on D/C if patient continues to be seen in the cancer center.  ? ?Length of Stay: 1 ? ?Current Medications: ?Scheduled Meds:  ? benazepril  20 mg Oral Daily  ? dexamethasone  4 mg Oral Q12H  ? docusate sodium  100 mg Oral BID  ? finasteride  5 mg Oral Daily  ? gabapentin  300 mg Oral QHS  ? guaiFENesin  600 mg Oral BID  ? insulin aspart  0-5 Units Subcutaneous QHS  ? insulin aspart  0-9 Units Subcutaneous TID WC  ? insulin glargine-yfgn  5 Units Subcutaneous Daily  ? ipratropium-albuterol  3 mL Nebulization BID  ? magnesium oxide  400 mg Oral Daily  ? mirtazapine  15 mg Oral QHS  ? multivitamin with minerals  1 tablet Oral Daily  ? PARoxetine  10 mg Oral Daily  ? pravastatin  20 mg Oral Daily  ? sodium chloride flush  3 mL Intravenous Q12H  ?  sodium chloride flush  3 mL Intravenous Q12H  ? sodium chloride  2 g Oral BID WC  ? tamsulosin  0.4 mg Oral QPC supper  ? ? ?Continuous Infusions: ? sodium chloride    ? ? ?PRN Meds: ?sodium chloride, acetaminophen **OR** acetaminophen, albuterol, hydrALAZINE, melatonin, ondansetron, oxyCODONE, sodium chloride flush ? ?Physical Exam ?Pulmonary:  ?   Effort: Pulmonary effort is normal.  ?Neurological:  ?  Mental Status: He is alert.  ?         ? ?Vital Signs: BP (!) 161/82 (BP Location: Left Arm)   Pulse 70   Temp (!) 97.4 ?F (36.3 ?C) (Axillary)   Resp 16   Ht 5\' 10"  (1.778 m)   Wt 54.9 kg   SpO2 95%   BMI 17.37 kg/m?  ?SpO2: SpO2: 95 % ?O2 Device: O2 Device: Room Air ?O2 Flow Rate:   ? ?Intake/output summary:  ?Intake/Output Summary (Last 24 hours) at 08/12/2021 1422 ?Last data filed at 08/12/2021 1300 ?Gross per 24 hour  ?Intake 340 ml  ?Output --  ?Net 340 ml  ? ?LBM: Last BM Date : 08/11/21 ?Baseline Weight: Weight: 54.9 kg ?Most recent weight: Weight: 54.9 kg ? ?     ? ? ?Patient Active Problem List  ? Diagnosis Date Noted  ? Acute metabolic encephalopathy 82/42/3536  ? Metastatic lung cancer (metastasis from lung to other site) [facility]) 08/11/2021  ? Hypercholesteremia   ? Hypertension   ? Altered mental status   ? Malignant neoplasm of lung ([place])   ? Brain lesion   ? Adenocarcinoma, lung, right ([place]) 07/23/2021  ? Mass of right lung 06/28/2021  ? Hyponatremia 06/10/2021  ? Abnormal brain MRI 06/10/2021  ? Diabetes mellitus type 2, insulin dependent ([place])   ? Abnormal LFTs   ? ? ?Palliative Care Assessment & Plan  ? ?Recommendations/Plan: ? ?Patient and family are waiting to speak with Dr. Grayland Ormond to determine care moving forward.  ?I will follow up Friday with them. Would recommend Billey Chang NP with oncology palliative to see patient on D/C if patient continues to be seen in the cancer center.  ? ? ? ?Code Status: ? ?  ?Code Status Orders  ?(From admission, onward)  ?  ? ? ?  ? ?  Start     Ordered  ?  08/11/21 1433  Do not attempt resuscitation (DNR)  Continuous       ?Question Answer Comment  ?In the event of cardiac or respiratory ARREST Do not call a ?code blue?   ?In the event of cardiac or respiratory ARREST Do not perform Intubation, CPR, defibrillation or ACLS   ?In the event of cardiac or respiratory ARREST Use medication by any route, position, wound care, and other measures to relive pain and suffering. May use oxygen, suction and manual treatment of airway obstruction as needed for comfort.   ?  ? 08/11/21 1432  ? ?  ?  ? ?  ? ?Code Status History   ? ? Date Active Date Inactive Code Status Order ID Comments User Context  ? 08/11/2021 1311 08/11/2021 1432 Full Code [id number]  Flora Lipps, MD ED  ? 06/10/2021 1805 06/17/2021 1848 Full Code [id number]  Dwyane Dee, MD ED  ? ?  ? ?Advance Directive Documentation   ? ?Flowsheet Row Most Recent Value  ?Type of Advance Directive Healthcare Power of [place], Living will  ?Pre-existing out of facility DNR order (yellow form or pink MOST form) --  ?"MOST" Form in Place? --  ? ? ?Thank you for allowing the Palliative Medicine Team to assist in the care of this patient. ? ? ? ?Asencion Gowda, NP ? ?Please contact Palliative Medicine Team phone at [phone] for questions and concerns.  ? ? ? ? ? ?

## 2021-08-12 NOTE — Telephone Encounter (Signed)
Patient is down to see Woodfin Ganja tomorrow at 9:00 am. He is currently admitted in the ED as of 5/2.  ?

## 2021-08-12 NOTE — Consult Note (Signed)
Neurology Consultation ? ?Reason for Consult: Subacute stroke ?Referring Physician: Dr. Louanne Belton ? ?CC: Subacute stroke with hemorrhagic transformation ? ?History is obtained from: Chart, patient, patient's family bedside ? ?HPI: John Perez is a 81 y.o. male past medical history of metastatic lung cancer diagnosed in March 2023 when he was admitted for evaluation of weakness, found to have hyponatremia and on further work-up lung cancer discovered, also has a history of diabetes, hypertension, hyperlipidemia brought in for evaluation of confusion.  Brain imaging-MRI brain without contrast-consistent with a subacute left parietal occipital stroke with hemorrhagic transformation.  Other abnormalities seen on lab testing or hyponatremia. ?Neurological consultation obtained due to the abnormal brain imaging. ?I recommended that the brain MRI with and without contrast to be done to make sure that there is no evidence of a mass and this is truly a stroke.  MRI with contrast completed and appearance consistent with a subacute stroke. ?Patient has had mental status of his right perihilar mass to the spine as well as hilar, supraclavicular and axillary lymph nodes. ?A left parietal area of possible low-grade neoplasm was seen on the MRI in February ? ?LKW: Unclear ?tpa given?: no, unclear last known well ?Premorbid modified Rankin scale (mRS): 3 ? ? ?ROS: Full ROS was performed and is negative except as noted in the HPI.  ? ?Past Medical History:  ?Diagnosis Date  ? Diabetes mellitus type 2, insulin dependent ([place])   ? HOH (hard of hearing)   ? Hypercholesteremia   ? Hypertension   ? Hyponatremia   ? ?No family history on file. ? ?Social History:  ? reports that he quit smoking about 33 years ago. His smoking use included cigarettes. He has a 15.00 pack-year smoking history. He has never used smokeless tobacco. He reports that he does not currently use alcohol. No history on file for drug use. ? ?Medications ? ?Current  Facility-Administered Medications:  ?  0.9 %  sodium chloride infusion, 250 mL, Intravenous, PRN, Pokhrel, Laxman, MD ?  acetaminophen (TYLENOL) tablet 650 mg, 650 mg, Oral, Q6H PRN **OR** acetaminophen (TYLENOL) suppository 650 mg, 650 mg, Rectal, Q6H PRN, Pokhrel, Laxman, MD ?  albuterol (PROVENTIL) (2.5 MG/3ML) 0.083% nebulizer solution 2.5 mg, 2.5 mg, Nebulization, Q2H PRN, Pokhrel, Laxman, MD ?  benazepril (LOTENSIN) tablet 20 mg, 20 mg, Oral, Daily, Pokhrel, Laxman, MD, 20 mg at 08/12/21 0801 ?  docusate sodium (COLACE) capsule 100 mg, 100 mg, Oral, BID, Pokhrel, Laxman, MD, 100 mg at 08/11/21 2211 ?  finasteride (PROSCAR) tablet 5 mg, 5 mg, Oral, Daily, Pokhrel, Laxman, MD, 5 mg at 08/12/21 0802 ?  guaiFENesin (MUCINEX) 12 hr tablet 600 mg, 600 mg, Oral, BID, Pokhrel, Laxman, MD, 600 mg at 08/12/21 0801 ?  hydrALAZINE (APRESOLINE) injection 10 mg, 10 mg, Intravenous, Q6H PRN, Pokhrel, Laxman, MD ?  insulin aspart (novoLOG) injection 0-5 Units, 0-5 Units, Subcutaneous, QHS, Foust, Katy L, NP, 4 Units at 08/11/21 2221 ?  insulin aspart (novoLOG) injection 0-9 Units, 0-9 Units, Subcutaneous, TID WC, Foust, Katy L, NP, 7 Units at 08/12/21 0850 ?  ipratropium-albuterol (DUONEB) 0.5-2.5 (3) MG/3ML nebulizer solution 3 mL, 3 mL, Nebulization, BID, Sreenath, Sudheer B, MD ?  magnesium oxide (MAG-OX) tablet 400 mg, 400 mg, Oral, Daily, Pokhrel, Laxman, MD, 400 mg at 08/12/21 0801 ?  multivitamin with minerals tablet 1 tablet, 1 tablet, Oral, Daily, Pokhrel, Laxman, MD, 1 tablet at 08/12/21 0802 ?  ondansetron (ZOFRAN) tablet 4 mg, 4 mg, Oral, Q6H PRN **OR** ondansetron (ZOFRAN) injection 4  mg, 4 mg, Intravenous, Q6H PRN, Pokhrel, Laxman, MD ?  pravastatin (PRAVACHOL) tablet 20 mg, 20 mg, Oral, Daily, Pokhrel, Laxman, MD, 20 mg at 08/12/21 0802 ?  sodium chloride flush (NS) 0.9 % injection 3 mL, 3 mL, Intravenous, Q12H, Pokhrel, Laxman, MD, 3 mL at 08/11/21 1515 ?  sodium chloride flush (NS) 0.9 % injection 3 mL, 3 mL,  Intravenous, Q12H, Pokhrel, Laxman, MD, 3 mL at 08/12/21 0813 ?  sodium chloride flush (NS) 0.9 % injection 3 mL, 3 mL, Intravenous, PRN, Pokhrel, Laxman, MD ?  sodium chloride tablet 2 g, 2 g, Oral, BID WC, Pokhrel, Laxman, MD, 2 g at 08/12/21 0752 ?  tamsulosin (FLOMAX) capsule 0.4 mg, 0.4 mg, Oral, QPC supper, Pokhrel, Laxman, MD, 0.4 mg at 08/11/21 1721 ? ?Exam: ?Current vital signs: ?BP (!) 161/82 (BP Location: Left Arm)   Pulse 70   Temp (!) 97.4 ?F (36.3 ?C) (Axillary)   Resp 16   Ht 5\' 10"  (1.778 m)   Wt 54.9 kg   SpO2 95%   BMI 17.37 kg/m?  ?Vital signs in last 24 hours: ?Temp:  [97.4 ?F (36.3 ?C)-98.6 ?F (37 ?C)] 97.4 ?F (36.3 ?C) (05/03 0815) ?Pulse Rate:  [70-105] 70 (05/03 0701) ?Resp:  [16-20] 16 (05/03 0701) ?BP: (132-183)/(67-90) 161/82 (05/03 0701) ?SpO2:  [91 %-96 %] 95 % (05/03 0701) ?Weight:  [54.9 kg] 54.9 kg (05/02 1933) ?General: Awake alert and somewhat of distress due to being very uncomfortable with generalized body discomfort. ?HEENT: Normocephalic atraumatic ?Lungs: Clear ?Cardiovascular: Regular rhythm ?Abdomen nondistended nontender ?Neurological exam ?Awake alert oriented x3(almost)-said the month is April when it is the third day of May and told me that he is in the hospital [facility] but could not name the facility. ?Speech is not dysarthric ?No aphasia ?Cranial nerves II to XII intact ?Motor examination with no drift ?Sensation intact light touch ?Coordination with no dysmetria ?NIH stroke scale: 31-month ? ?Labs ?I have reviewed labs in epic and the results pertinent to this consultation are: ? ? ?CBC ?   ?Component Value Date/Time  ? WBC 9.0 08/12/2021 0537  ? RBC 3.21 (L) 08/12/2021 0537  ? HGB 10.9 (L) 08/12/2021 0537  ? HCT 30.5 (L) 08/12/2021 0537  ? PLT 196 08/12/2021 0537  ? MCV 95.0 08/12/2021 0537  ? MCH 34.0 08/12/2021 0537  ? MCHC 35.7 08/12/2021 0537  ? RDW 11.9 08/12/2021 0537  ? LYMPHSABS 0.6 (L) 08/11/2021 0904  ? MONOABS 0.8 08/11/2021 0904  ? EOSABS 0.0  08/11/2021 0904  ? BASOSABS 0.0 08/11/2021 0904  ? ? ?CMP  ?   ?Component Value Date/Time  ? NA 123 (L) 08/12/2021 0537  ? K 3.8 08/12/2021 0537  ? CL 88 (L) 08/12/2021 0537  ? CO2 22 08/12/2021 0537  ? GLUCOSE 316 (H) 08/12/2021 0537  ? BUN 11 08/12/2021 0537  ? CREATININE 0.62 08/12/2021 0537  ? CALCIUM 8.2 (L) 08/12/2021 0537  ? PROT 5.9 (L) 08/12/2021 0537  ? ALBUMIN 3.0 (L) 08/12/2021 0537  ? AST 31 08/12/2021 0537  ? ALT 24 08/12/2021 0537  ? ALKPHOS 161 (H) 08/12/2021 0537  ? BILITOT 1.6 (H) 08/12/2021 0537  ? GFRNONAA >60 08/12/2021 0537  ? ? ?Imaging ?I have reviewed the images obtained: ?CT head: Progressively expansile and conspicuous hypodensity in the high left parietal lobe now involving cortex and subcortical white matter-question petechial hemorrhage versus spared cortex in this region.  Findings suspicious for interval acute/subacute infarct versus malignancy. ?MRI of the brain confirms  restricted diffusion indicating stroke in that area.  MR brain with contrast also suggestive of stroke and not supportive of malignancy ? ?Assessment: 81 year old gentleman with recent diagnosis of lung cancer that is metastatic to lymph nodes and spine comes in for evaluation of weakness and found to be hyponatremic.  During brain imaging performed as a part of work-up for the confusion, noted to have a subacute left parietal occipital infarct-see imaging above. ?Etiology under investigation but given the history of metastatic cancer, hypercoagulability most likely versus cardioembolic from atrial fibrillation. ? ?Recommendations: ?Frequent neurochecks ?Telemetry ?CTA head and neck ?2D echo ?Lipid panel ?A1c-6.6 in March.  No need to repeat. ?PT OT speech therapy ?Hold antiplatelets anticoagulants for now given some hemorrhagic transformation - timing is unclear but I will hold for a day more till I have all the work up back. ?Will need long-term cardiac monitoring as an outpatient to evaluate for paroxysmal atrial  fibrillation. ?We will need to discuss with oncology the necessity for anticoagulation if they feel that he is hypercoagulable due to the malignancy-but I will do this once I have all the vessel imaging and other t

## 2021-08-13 ENCOUNTER — Inpatient Hospital Stay: Payer: Medicare HMO

## 2021-08-13 ENCOUNTER — Ambulatory Visit: Payer: Medicare HMO

## 2021-08-13 ENCOUNTER — Inpatient Hospital Stay: Payer: Medicare HMO | Admitting: Oncology

## 2021-08-13 DIAGNOSIS — I639 Cerebral infarction, unspecified: Secondary | ICD-10-CM | POA: Diagnosis not present

## 2021-08-13 DIAGNOSIS — G9341 Metabolic encephalopathy: Secondary | ICD-10-CM | POA: Diagnosis not present

## 2021-08-13 DIAGNOSIS — C3491 Malignant neoplasm of unspecified part of right bronchus or lung: Secondary | ICD-10-CM

## 2021-08-13 DIAGNOSIS — C3490 Malignant neoplasm of unspecified part of unspecified bronchus or lung: Secondary | ICD-10-CM | POA: Diagnosis not present

## 2021-08-13 DIAGNOSIS — E871 Hypo-osmolality and hyponatremia: Secondary | ICD-10-CM | POA: Diagnosis not present

## 2021-08-13 LAB — LIPID PANEL
Cholesterol: 114 mg/dL (ref 0–200)
HDL: 50 mg/dL (ref >40)
LDL Cholesterol: 57 mg/dL (ref 0–99)
Total CHOL/HDL Ratio: 2.3 RATIO
Triglycerides: 35 mg/dL (ref <150)
VLDL: 7 mg/dL (ref 0–40)

## 2021-08-13 LAB — GLUCOSE, CAPILLARY
Glucose-Capillary: 257 mg/dL — ABNORMAL HIGH (ref 70–99)
Glucose-Capillary: 280 mg/dL — ABNORMAL HIGH (ref 70–99)
Glucose-Capillary: 306 mg/dL — ABNORMAL HIGH (ref 70–99)
Glucose-Capillary: 198 mg/dL — ABNORMAL HIGH (ref 70–99)

## 2021-08-13 LAB — RENAL FUNCTION PANEL
Albumin: 2.8 g/dL — ABNORMAL LOW (ref 3.5–5.0)
Anion gap: 11 (ref 5–15)
BUN: 14 mg/dL (ref 8–23)
CO2: 23 mmol/L (ref 22–32)
Calcium: 8.3 mg/dL — ABNORMAL LOW (ref 8.9–10.3)
Chloride: 92 mmol/L — ABNORMAL LOW (ref 98–111)
Creatinine, Ser: 0.44 mg/dL — ABNORMAL LOW (ref 0.61–1.24)
GFR, Estimated: >60 mL/min (ref >60)
Glucose, Bld: 186 mg/dL — ABNORMAL HIGH (ref 70–99)
Phosphorus: 2.9 mg/dL (ref 2.5–4.6)
Potassium: 3.8 mmol/L (ref 3.5–5.1)
Sodium: 126 mmol/L — ABNORMAL LOW (ref 135–145)

## 2021-08-13 LAB — BASIC METABOLIC PANEL
Anion gap: 10 (ref 5–15)
BUN: 14 mg/dL (ref 8–23)
CO2: 24 mmol/L (ref 22–32)
Calcium: 8.3 mg/dL — ABNORMAL LOW (ref 8.9–10.3)
Chloride: 90 mmol/L — ABNORMAL LOW (ref 98–111)
Creatinine, Ser: 0.39 mg/dL — ABNORMAL LOW (ref 0.61–1.24)
GFR, Estimated: >60 mL/min (ref >60)
Glucose, Bld: 183 mg/dL — ABNORMAL HIGH (ref 70–99)
Potassium: 3.8 mmol/L (ref 3.5–5.1)
Sodium: 124 mmol/L — ABNORMAL LOW (ref 135–145)

## 2021-08-13 LAB — CBC
HCT: 29.9 % — ABNORMAL LOW (ref 39.0–52.0)
Hemoglobin: 10.8 g/dL — ABNORMAL LOW (ref 13.0–17.0)
MCH: 34 pg (ref 26.0–34.0)
MCHC: 36.1 g/dL — ABNORMAL HIGH (ref 30.0–36.0)
MCV: 94 fL (ref 80.0–100.0)
Platelets: 211 10*3/uL (ref 150–400)
RBC: 3.18 MIL/uL — ABNORMAL LOW (ref 4.22–5.81)
RDW: 12.1 % (ref 11.5–15.5)
WBC: 11.2 10*3/uL — ABNORMAL HIGH (ref 4.0–10.5)
nRBC: 0 % (ref 0.0–0.2)

## 2021-08-13 MED ORDER — MELATONIN 5 MG PO TABS
10.0000 mg | ORAL_TABLET | Freq: Every evening | ORAL | Status: DC | PRN
  Administered 2021-08-14 – 2021-08-17 (×4): 10 mg via ORAL
  Filled 2021-08-13 (×4): qty 2

## 2021-08-13 MED ORDER — CLOPIDOGREL BISULFATE 75 MG PO TABS
75.0000 mg | ORAL_TABLET | Freq: Every day | ORAL | Status: DC
  Administered 2021-08-13 – 2021-08-18 (×6): 75 mg via ORAL
  Filled 2021-08-13 (×6): qty 1

## 2021-08-13 MED ORDER — AMLODIPINE BESYLATE 5 MG PO TABS
5.0000 mg | ORAL_TABLET | Freq: Every day | ORAL | Status: DC
  Administered 2021-08-13 – 2021-08-18 (×6): 5 mg via ORAL
  Filled 2021-08-13 (×6): qty 1

## 2021-08-13 MED ORDER — DEXAMETHASONE 4 MG PO TABS
4.0000 mg | ORAL_TABLET | Freq: Every day | ORAL | Status: DC
Start: 2021-08-14 — End: 2021-08-14
  Administered 2021-08-14: 4 mg via ORAL
  Filled 2021-08-13: qty 1

## 2021-08-13 MED ORDER — ASPIRIN EC 81 MG PO TBEC
81.0000 mg | DELAYED_RELEASE_TABLET | Freq: Every day | ORAL | Status: DC
  Administered 2021-08-13 – 2021-08-18 (×6): 81 mg via ORAL
  Filled 2021-08-13 (×6): qty 1

## 2021-08-13 MED ORDER — HYDRALAZINE HCL 20 MG/ML IJ SOLN
10.0000 mg | INTRAMUSCULAR | Status: DC | PRN
  Administered 2021-08-13: 10 mg via INTRAVENOUS
  Filled 2021-08-13: qty 1

## 2021-08-13 NOTE — Progress Notes (Signed)
Physical Therapy Treatment ?Patient Details ?Name: John Perez ?MRN: [id number] ?DOB: 1941-02-22 ?Today's Date: 08/13/2021 ? ? ?History of Present Illness Pt is admitted for acute metabolic encephalopathy with acute/subacute L occipital and parietal lobe infarct. Of note, also with + lung mass and recent falls. Other history includes DM, HLD, and HTN. ? ?  ?PT Comments  ? ? Pt is making good progress towards goals. Per family, has been ambulatory in hallway, then napping, and now awake ready to eat lunch. Pt able to eat 1/2 PBJ and then able to ambulate in hallway. Forward flexed posture noted, able to follow commands, and agreeable. At end of session, pt requesting to finish lunch. Left in chair with family at bedside. Will continue to progress as able.   ?Recommendations for follow up therapy are one component of a multi-disciplinary discharge planning process, led by the attending physician.  Recommendations may be updated based on patient status, additional functional criteria and insurance authorization. ? ?Follow Up Recommendations ? Home health PT ?  ?  ?Assistance Recommended at Discharge Frequent or constant Supervision/Assistance  ?Patient can return home with the following A little help with walking and/or transfers;A little help with bathing/dressing/bathroom;Assist for transportation ?  ?Equipment Recommendations ? None recommended by PT  ?  ?Recommendations for Other Services   ? ? ?  ?Precautions / Restrictions Precautions ?Precautions: Fall ?Restrictions ?Weight Bearing Restrictions: No  ?  ? ?Mobility ? Bed Mobility ?  ?  ?  ?  ?  ?  ?  ?General bed mobility comments: not performed, received in seated position at EOB ?  ? ?Transfers ?Overall transfer level: Needs assistance ?Equipment used: Rolling walker (2 wheels) ?Transfers: Sit to/from Stand ?Sit to Stand: Min assist ?  ?  ?  ?  ?  ?General transfer comment: cues for hand placement, however continues to pull up on RW. Once standing, forward flexed  posutre ?  ? ?Ambulation/Gait ?Ambulation/Gait assistance: Min guard ?Gait Distance (Feet): 200 Feet ?Assistive device: Rolling walker (2 wheels) ?Gait Pattern/deviations: Step-through pattern ?  ?  ?  ?General Gait Details: ambulated throughout hallway with RW. Cues for sequencing. Safe technique and able to follow commands. ? ? ?Stairs ?  ?  ?  ?  ?  ? ? ?Wheelchair Mobility ?  ? ?Modified Rankin (Stroke Patients Only) ?  ? ? ?  ?Balance Overall balance assessment: History of Falls, Needs assistance ?Sitting-balance support: Feet supported ?Sitting balance-Leahy Scale: Good ?  ?  ?Standing balance support: Bilateral upper extremity supported, Reliant on assistive device for balance, During functional activity ?Standing balance-Leahy Scale: Fair ?  ?  ?  ?  ?  ?  ?  ?  ?  ?  ?  ?  ?  ? ?  ?Cognition Arousal/Alertness: Awake/alert ?Behavior During Therapy: [facility] for tasks assessed/performed ?Overall Cognitive Status: Impaired/Different from baseline ?  ?  ?  ?  ?  ?  ?  ?  ?  ?  ?  ?  ?  ?  ?  ?  ?General Comments: pleasantly confused, No agitation noted this date. Agreeable to session ?  ?  ? ?  ?Exercises Other Exercises ?Other Exercises: able to sit at EOB and eat lunch with room set up. ? ?  ?General Comments   ?  ?  ? ?Pertinent Vitals/Pain Pain Assessment ?Pain Assessment: No/denies pain  ? ? ?Home Living Family/patient expects to be discharged to:: Private residence ?Living Arrangements: Spouse/significant other ?Available Help at Discharge:  Family;Friend(s);Available 24 hours/day ?Type of Home: House ?Home Access: Stairs to enter ?Entrance Stairs-Rails: Can reach both;Right;Left ?Entrance Stairs-Number of Steps: 3 ?Alternate Level Stairs-Number of Steps: 15 ?Home Layout: Multi-level;Able to live on main level with bedroom/bathroom ?Home Equipment: Grab bars - tub/shower;Hand held shower head;Rolling Walker (2 wheels);BSC/3in1;Wheelchair - manual ?   ?  ?Prior Function    ?  ?  ?   ? ?PT Goals (current goals  can now be found in the care plan section) Acute Rehab PT Goals ?Patient Stated Goal: to eat some lunch ?PT Goal Formulation: Patient unable to participate in goal setting ?Time For Goal Achievement: 08-30-21 ?Potential to Achieve Goals: Fair ?Progress towards PT goals: Progressing toward goals ? ?  ?Frequency ? ? ? Min 2X/week ? ? ? ?  ?PT Plan Current plan remains appropriate  ? ? ?Co-evaluation   ?  ?  ?  ?  ? ?  ?AM-PAC PT "6 Clicks" Mobility   ?Outcome Measure ? Help needed turning from your back to your side while in a flat bed without using bedrails?: None ?Help needed moving from lying on your back to sitting on the side of a flat bed without using bedrails?: None ?Help needed moving to and from a bed to a chair (including a wheelchair)?: None ?Help needed standing up from a chair using your arms (e.g., wheelchair or bedside chair)?: A Little ?Help needed to walk in hospital room?: A Little ?Help needed climbing 3-5 steps with a railing? : A Little ?6 Click Score: 21 ? ?  ?End of Session Equipment Utilized During Treatment: Gait belt ?Activity Tolerance: Patient tolerated treatment well ?Patient left:  (left with family in room seated in chair eating lunch) ?Nurse Communication: Mobility status ?PT Visit Diagnosis: Unsteadiness on feet (R26.81);Muscle weakness (generalized) (M62.81);History of falling (Z91.81);Difficulty in walking, not elsewhere classified (R26.2) ?  ? ? ?Time: 1344-1400 ?PT Time Calculation (min) (ACUTE ONLY): 16 min ? ?Charges:  $Gait Training: 8-22 mins          ?          ? ?Greggory Stallion, PT, DPT, GCS ?[phone] ? ? ? ?Rion Catala ?08/13/2021, 2:26 PM ? ?

## 2021-08-13 NOTE — Progress Notes (Signed)
?PROGRESS NOTE ? ? ? John Perez  CHE:[id number] DOB: [birth year] DOA: 08/11/2021 ?PCP: Derinda Late, MD  ? ? ?Brief Narrative:  ?81 y.o. male Patient is a 81 years old male with past medical history of diabetes, hyperlipidemia, hypertension, history of lung cancer who follows up with Dr. Grayland Ormond as outpatient, history of chronic hyponatremia presented to hospital with worsening confusion.  Patient has been more confused than usual and had passed out yesterday.  Patient sodium level was 119 and on repeat in the ED was 122.  Patient was then brought into the hospital for altered mental status.  Patient does have a history of lung cancer and was scheduled to undergo radiation treatment starting today but has presented to the hospital at this time.  Patient's daughter at bedside stated that patient was confused lethargic yesterday and had few falls.  He has been having impaired appetite for the last 2 to 3 days.  Normally was able to ambulate by himself but for the last 2 to 3 days has not been able to do much.  There is no mention of nausea vomiting or diarrhea.  No mention of fever chills or rigor.  No mention of increasing cough or shortness of breath/dyspnea.  Denies any urinary urgency frequency dysuria and but not been drinking much recently.  Patient does have history of chronic hyponatremia and was on salt tablets in the past but was not currently taking it as per the primary care physician. ?  ?In the ED, patient had stable vitals.  UA showed some glucose but no evidence of infection..  Initial BNP was 122.  Potassium was 3.5.  Creatinine of 0.4.  WBC was mildly elevated at 10.8.  Hemoglobin of 11.8.  Chest x-ray showed extensive interstitial and airspace opacities throughout the right lung significantly progressed from previous PET CT scan on 07/07/2021.  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  CT head scan showed progressive expansile and conspicuous hypodensity in the high left parietal lobe.  MRI of the was recommended.  Patient received dexamethasone 10 mg IV in the ED.  Oncology Dr. Tasia Catchings was notified from the ED and patient was consulted for admission to the hospital for further evaluation and treatment. ? ?5/3: MRI reviewed.  Oncology and neurology engaged.  MRI findings consistent with CVA.  No clear evidence of leptomeningeal spread or intracranial metastasis.  Mental status weakness improving.  Nephrology engaged for recommendations regarding hyponatremia treatment ? ?5/4: Patient appears more tremulous this morning, complaining of chills.  Serum sodium improving. ? ? ?Assessment & Plan: ?  ?Principal Problem: ?  Acute metabolic encephalopathy ?Active Problems: ?  Hyponatremia ?  Diabetes mellitus type 2, insulin dependent ([place]) ?  Metastatic lung cancer (metastasis from lung to other site) [facility]) ?  Hypercholesteremia ?  Hypertension ?  Altered mental status ?  Malignant neoplasm of lung ([place]) ?  Brain lesion ? ?Acute metabolic encephalopathy ?Severe symptomatic hyponatremia ?Patient does have chronic hyponatremia ?Sodium on presentation 119 ?Started on salt tablets 2 g twice daily ?Serum sodium improving ?Plan: ?Continue salt tabs 2 g twice daily ?Can continue oxycodone ?Can continue Paxil ?Appreciate nephrology assistance ? ?Lung cancer with metastasis ?Patient's primary oncologist is Dr. Grayland Ormond ?Patient with extensive tumor burden ?Overall poor prognosis ?No clear radiographic evidence  of cerebral metastasis ?Plan: ?Spoke to daughter at bedside.   ?She wishes to discuss treatment options with patient's primary oncologist Dr. Grayland Ormond ? ?Type 2 diabetes mellitus with hyperglycemia ?Sugars have been elevated in the setting of  steroid use ?Will continue steroids but taper dose ?Plan: ?Decadron 4 mg daily ?Continue Semglee 5 units daily ?Sliding-scale and nightly coverage ? ?Essential hypertension ?Blood pressure controlled ?Continue benazepril ?Hold Lasix ?IV hydralazine as needed ? ?Hyperlipidemia ?PTA pravastatin ? ?BPH ?PTA Flomax and finasteride ? ?Hyponatremia ?Resolved ?Monitor and replace as necessary ? ? ? ?DVT prophylaxis: SQ Lovenox ?Code Status: DNR ?Family Communication: Daughter at bedside 5/3, 5/4 ?Disposition Plan: Status is: Inpatient ?Remains inpatient appropriate because: Hyponatremia in the setting of metastatic lung CA.  Slowly improving.  Possible disposition in 24 to 48 hours. ? ? ?Level of care: Telemetry Medical ? ?Consultants:  ?Oncology ?Nephrology ?Palliative care ?Neurology ? ?Procedures:  ?None ? ?Antimicrobials: ?None ? ? ?Subjective: ?Seen and examined.  Resting in bed.  Tremulous and uncomfortable appearing ? ?Objective: ?Vitals:  ? 08/12/21 1931 08/12/21 2023 08/13/21 0615 08/13/21 4193  ?BP: (!) 155/83  (!) 176/97 (!) 172/98  ?Pulse: 89  84 87  ?Resp: 19  17 20   ?Temp: 97.9 ?F (36.6 ?C)  98 ?F (36.7 ?C) 98.1 ?F (36.7 ?C)  ?TempSrc:      ?SpO2: 95% 95% 93% 97%  ?Weight:      ?Height:      ? ? ?Intake/Output Summary (Last 24 hours) at 08/13/2021 1025 ?Last data filed at 08/12/2021 1846 ?Gross per 24 hour  ?Intake 480 ml  ?Output --  ?Net 480 ml  ? ?Filed Weights  ? 08/11/21 0834 08/11/21 1933  ?Weight: 54.9 kg 54.9 kg  ? ? ?Examination: ? ?General exam: NAD.  Appears frail.  Mildly tremulous ?Respiratory system: Diminished lung sounds.  Lungs overall clear.  Normal work of breathing.  Room air ?Cardiovascular system: S1-S2, RRR, no murmurs, no pedal edema ?Gastrointestinal system: Soft, NT/ND, normal bowel sounds ?Central nervous system: Alert and oriented. No focal neurological deficits. ?Extremities: Symmetric 5 x 5 power. ?Skin: No rashes, lesions or ulcers ?Psychiatry: Judgement and insight appear normal.  Mood & affect appropriate.  ? ? ? ?Data Reviewed: I have personally reviewed following labs and imaging studies ? ?CBC: ?Recent Labs  ?Lab 08/10/21 ?1433 08/11/21 ?7902 08/12/21 ?4097  ?WBC 13.4* 10.8* 9.0  ?NEUTROABS 11.2* 9.2*  --   ?HGB 12.2* 11.8* 10.9*  ?HCT 33.3* 33.4* 30.5*  ?MCV 94.6 97.4 95.0  ?PLT 239 221 196  ? ?Basic Metabolic Panel: ?Recent Labs  ?Lab 08/10/21 ?1433 08/11/21 ?3532 08/12/21 ?9924 08/13/21 ?0406  ?NA 119* 122* 123* 126*  ?K 4.1 3.5 3.8 3.8  ?CL 81* 88* 88* 92*  ?CO2 26 21* 22 23  ?GLUCOSE 197* 218* 316* 186*  ?BUN 16 11 11 14   ?CREATININE 0.57* 0.44* 0.62 0.44*  ?CALCIUM 8.4* 8.0* 8.2* 8.3*  ?MG  --   --  1.6*  --   ?PHOS  --   --   --  2.9  ? ?GFR: ?Estimated Creatinine Clearance: 56.2 mL/min (A) (by C-G formula based on SCr of 0.44 mg/dL (L)). ?Liver Function Tests: ?Recent Labs  ?Lab 08/10/21 ?1433 08/11/21 ?2683 08/12/21 ?4196 08/13/21 ?0406  ?AST 33 31 31  --   ?ALT 24 22 24   --   ?ALKPHOS 192* 167* 161*  --   ?BILITOT 1.4* 1.4* 1.6*  --   ?PROT 6.3* 6.0* 5.9*  --   ?ALBUMIN  3.0* 2.9* 3.0* 2.8*  ? ?No results for input(s): LIPASE, AMYLASE in the last 168 hours. ?No results for input(s): AMMONIA in the last 168 hours. ?Coagulation Profile: ?No results for input(s): INR, PROTIME in the last 168 hours. ?Cardiac Enzymes: ?No results for input(s): CKTOTAL, CKMB, CKMBINDEX, TROPONINI in the last 168 hours. ?BNP (last 3 results) ?No results for input(s): PROBNP in the last 8760 hours. ?HbA1C: ?No results for input(s): HGBA1C in the last 72 hours. ?CBG: ?Recent Labs  ?Lab 08/12/21 ?1151 08/12/21 ?1613 08/12/21 ?1816 08/12/21 ?2115 08/13/21 ?0724  ?GLUCAP 318* 240* 398* 240* 257*  ? ?Lipid Profile: ?Recent Labs  ?  08/13/21 ?0406  ?CHOL 114  ?HDL 50  ?[place] 57  ?TRIG 35  ?CHOLHDL 2.3  ? ?Thyroid Function Tests: ?No results for input(s): TSH, T4TOTAL, FREET4, T3FREE, THYROIDAB in the last 72 hours. ?Anemia Panel: ?No results for input(s): VITAMINB12, FOLATE, FERRITIN, TIBC, IRON, RETICCTPCT  in the last 72 hours. ?Sepsis Labs: ?No results for input(s): PROCALCITON, LATICACIDVEN in the last 168 hours. ? ?No results found for this or any previous visit (from the past 240 hour(s)).  ? ? ? ? ? ?Rad

## 2021-08-13 NOTE — Evaluation (Signed)
Occupational Therapy Evaluation ?Patient Details ?Name: John Perez ?MRN: [id number] ?DOB: [birth year] ?Today's Date: 08/13/2021 ? ? ?History of Present Illness Pt is admitted for acute metabolic encephalopathy with acute/subacute L occipital and parietal lobe infarct. Of note, also with + lung mass and recent falls. Other history includes DM, HLD, and HTN.  ? ?Clinical Impression ?  ?Pt greeted in room with daughter present. Pt is oriented to self only, daughter provides biographical information. Pt is pleasant and follows one step directions inconsistently with increased time. Pt daughter Sharee Pimple reports increased difficulties with amb with fall history PTA. At this time pt presents with deficits in activity tolerance, strength, endurance. Pt is performing ADL/mobility below PLOF at this time. Vision appears at baseline however will continue to assess. Education provided re: DME/AE use as needed, safe ADL completion at home. Pt is left in bedside chair in care of NT, NAD, all needs met. Recommend discharge home with [place], OT will continue to follow acutely.  ?   ? ?Recommendations for follow up therapy are one component of a multi-disciplinary discharge planning process, led by the attending physician.  Recommendations may be updated based on patient status, additional functional criteria and insurance authorization.  ? ?Follow Up Recommendations ? Home health OT  ?  ?Assistance Recommended at Discharge Frequent or constant Supervision/Assistance  ?Patient can return home with the following A little help with walking and/or transfers;A little help with bathing/dressing/bathroom;Assistance with feeding;Assist for transportation;Direct supervision/assist for financial management;Assistance with cooking/housework;Direct supervision/assist for medications management;Help with stairs or ramp for entrance ? ?  ?Functional Status Assessment ? Patient has had a recent decline in their functional status and demonstrates the  ability to make significant improvements in function in a reasonable and predictable amount of time.  ?Equipment Recommendations ? None recommended by OT;Other (comment) (pt has all recommended equipment)  ?  ?Recommendations for Other Services   ? ? ?  ?Precautions / Restrictions Precautions ?Precautions: Fall ?Restrictions ?Weight Bearing Restrictions: No  ? ?  ? ?Mobility Bed Mobility ?Overal bed mobility: Needs Assistance ?Bed Mobility: Supine to Sit ?  ?  ?Supine to sit: Mod assist, HOB elevated ?  ?  ?General bed mobility comments: step by step vcs ?  ? ?Transfers ?Overall transfer level: Needs assistance ?Equipment used: Rolling walker (2 wheels) ?Transfers: Sit to/from Stand ?Sit to Stand: Min assist ?  ?  ?  ?  ?  ?General transfer comment: vcs for technique ?  ? ?  ?Balance Overall balance assessment: History of Falls, Needs assistance ?Sitting-balance support: Feet supported ?Sitting balance-Leahy Scale: Good ?  ?  ?Standing balance support: Bilateral upper extremity supported, Reliant on assistive device for balance, During functional activity ?Standing balance-Leahy Scale: Fair ?  ?  ?  ?  ?  ?  ?  ?  ?  ?  ?  ?  ?   ? ?ADL either performed or assessed with clinical judgement  ? ?ADL Overall ADL's : Needs assistance/impaired ?  ?  ?Grooming: Wash/dry hands;Standing;Supervision/safety ?Grooming Details (indicate cue type and reason): at sink with RW ?  ?  ?  ?  ?  ?  ?  ?  ?Toilet Transfer: Min guard;Minimal assistance ?Toilet Transfer Details (indicate cue type and reason): simulated to besdie chair, CGA for stand>sit, sit>stand with MIN A ?  ?  ?  ?  ?Functional mobility during ADLs: Min guard;Rolling walker (2 wheels) ?   ? ? ? ?Vision   ?Additional Comments: no report of vision  changes, will continue to assess  ?   ?Perception   ?  ?Praxis   ?  ? ?Pertinent Vitals/Pain Pain Assessment ?Pain Assessment: No/denies pain  ? ? ? ?Hand Dominance   ?  ?Extremity/Trunk Assessment Upper Extremity  Assessment ?Upper Extremity Assessment: Generalized weakness ?  ?Lower Extremity Assessment ?Lower Extremity Assessment: Generalized weakness ?  ?Cervical / Trunk Assessment ?Cervical / Trunk Assessment: Kyphotic ?  ?Communication Communication ?Communication: No difficulties ?  ?Cognition Arousal/Alertness: Awake/alert ?Behavior During Therapy: [facility] for tasks assessed/performed ?Overall Cognitive Status: Impaired/Different from baseline ?Area of Impairment: Orientation, Attention, Memory, Following commands, Safety/judgement, Awareness, Problem solving ?  ?  ?  ?  ?  ?  ?  ?  ?Orientation Level: Disoriented to, Place, Time, Situation ("I am at an activity center that my daughter plans") ?Current Attention Level: Focused ?Memory: Decreased recall of precautions, Decreased short-term memory ?Following Commands: Follows one step commands with increased time ?Safety/Judgement: Decreased awareness of safety, Decreased awareness of deficits ?Awareness: Intellectual ?Problem Solving: Slow processing, Decreased initiation, Difficulty sequencing, Requires verbal cues, Requires tactile cues ?  ?  ?  ?General Comments    ? ?  ?Exercises Other Exercises ?Other Exercises: edu re: pt and daughter role of OT, discharge recommendations, DME/AE use, falls prevention,safe ADL task completion ?  ?Shoulder Instructions    ? ? ?Home Living Family/patient expects to be discharged to:: Private residence ?Living Arrangements: Spouse/significant other ?Available Help at Discharge: Family;Friend(s);Available 24 hours/day ?Type of Home: House ?Home Access: Stairs to enter ?Entrance Stairs-Number of Steps: 3 ?Entrance Stairs-Rails: Can reach both;Right;Left ?Home Layout: Multi-level;Able to live on main level with bedroom/bathroom ?Alternate Level Stairs-Number of Steps: 15 ?Alternate Level Stairs-Rails: Right;Left ?Bathroom Shower/Tub: Tub/shower unit ?  ?Bathroom Toilet: Standard ?  ?  ?Home Equipment: Grab bars - tub/shower;Hand held shower  head;Rolling Walker (2 wheels);BSC/3in1;Wheelchair - manual ?  ?  ?  ? ?  ?Prior Functioning/Environment Prior Level of Function : Independent/Modified Independent ?  ?  ?  ?  ?  ?  ?Mobility Comments: utilizing RW after previous admission, previously independent prior to previous admission ?ADLs Comments: history obtained from daugther in room- pt was previously completing ADL with MOD I assist as needed; Assist for IADLs ?  ? ?  ?  ?OT Problem List: Decreased strength;Decreased activity tolerance;Impaired balance (sitting and/or standing);Decreased safety awareness;Decreased knowledge of precautions;Decreased knowledge of use of DME or AE ?  ?   ?OT Treatment/Interventions: Self-care/ADL training;Therapeutic exercise;Patient/family education;Energy conservation;DME and/or AE instruction;Cognitive remediation/compensation;Therapeutic activities;Balance training  ?  ?OT Goals(Current goals can be found in the care plan section) Acute Rehab OT Goals ?Patient Stated Goal: go home ?OT Goal Formulation: With patient/family ?Time For Goal Achievement: 08/27/21 ?Potential to Achieve Goals: Good ?ADL Goals ?Pt Will Perform Grooming: with supervision;sitting;standing ?Pt Will Perform Upper Body Dressing: with supervision;sitting ?Pt Will Perform Lower Body Dressing: with supervision;sit to/from stand ?Pt Will Transfer to Toilet: with supervision;ambulating ?Pt Will Perform Toileting - Clothing Manipulation and hygiene: with supervision;sit to/from stand  ?OT Frequency: Min 2X/week ?  ? ?Co-evaluation   ?  ?  ?  ?  ? ?  ?AM-PAC OT "6 Clicks" Daily Activity     ?Outcome Measure Help from another person eating meals?: None ?Help from another person taking care of personal grooming?: A Little ?Help from another person toileting, which includes using toliet, bedpan, or urinal?: A Little ?Help from another person bathing (including washing, rinsing, drying)?: A Lot ?Help from another person to put on  and taking off regular upper  body clothing?: A Little ?Help from another person to put on and taking off regular lower body clothing?: A Lot ?6 Click Score: 17 ?  ?End of Session Equipment Utilized During Treatment: Gait belt;Rolling walker (2 wh

## 2021-08-13 NOTE — Progress Notes (Signed)
SLP Cancellation Note ? ?Patient Details ?Name: John Perez ?MRN: [id number] ?DOB: 1941-04-08 ? ? ?Cancelled treatment:       Reason Eval/Treat Not Completed: Patient at procedure or test/unavailable  ? ?SLP consult received and appreciated. Chart review completed. Attempted cognitive-linguistic evaluation. Pt undergoing pt care needs (bathing) with NT. Will continue efforts as appropriate. ? ?Cherrie Gauze, M.S., CCC-SLP ?Speech-Language Pathologist ?[facility] ?([phone] ([place]) ? ?Quintella Baton ?08/13/2021, 10:11 AM ?

## 2021-08-13 NOTE — Progress Notes (Addendum)
SLP Cancellation Note ? ?Patient Details ?Name: John Perez ?MRN: [id number] ?DOB: Mar 01, 1941 ? ? ?Cancelled treatment:       Reason Eval/Treat Not Completed: Fatigue/lethargy limiting ability to participate  ? ?Attempted cognitive-lingusitic evaluation. Pt found sleeping soundly in bed. Daughter, Sharee Pimple, present. Daughter requesting to allow pt to sleep. Introduced self, role of SLP, rationale for cognitive-linguistic evaluation, obtained case hx from daughter, reviewed factors that could be affecting pt's cognition (e.g. interrupted sleep, lack of routine/unfamiliar environment, changes to medication/labs, etc) and discussed plan to attempted cognitive-linguistic evaluation next date with daughter. Daughter verbalized understanding/agreement. Daughter noted pt with fluctuations in cognitive functioning and that he "appears worse" than yesterday. Daughter noted pt's speech is clear, but confusion is noted. Daughter denies pt with any difficulty swallowing this admission. Daughter endorsed pt lives with wife and was independent and very active prior to previous hospital admission.  ? ?Based on chart review and daughter report, anticipate need for frequent/constant supervision at d/c as well as likely need for cognitive-linguistic evaluation/tx at d/c.  ? ?SLP to continue efforts as appropriate.  ? ?Time spent with daughter: 1100-1115  ? ?Cherrie Gauze, M.S., CCC-SLP ?Speech-Language Pathologist ?[facility] ?([phone] ([place])  ? ?Quintella Baton ?08/13/2021, 11:20 AM ?

## 2021-08-13 NOTE — Progress Notes (Addendum)
Neurology Progress Note ? ? ?S:// ?Seen and examined. ?Very uncomfortable due to back discomfort ? ? ?O:// ?Current vital signs: ?BP 115/80   Pulse 87   Temp 98.1 ?F (36.7 ?C)   Resp 20   Ht 5\' 10"  (1.778 m)   Wt 54.9 kg   SpO2 97%   BMI 17.37 kg/m?  ?Vital signs in last 24 hours: ?Temp:  [97.9 ?F (36.6 ?C)-98.1 ?F (36.7 ?C)] 98.1 ?F (36.7 ?C) (05/04 4132) ?Pulse Rate:  [83-89] 87 (05/04 0722) ?Resp:  [17-20] 20 (05/04 4401) ?BP: (115-180)/(80-98) 115/80 (05/04 1038) ?SpO2:  [93 %-97 %] 97 % (05/04 0722) ? ?General: Awake alert and somewhat of distress due to being very uncomfortable with generalized body/back discomfort. ?HEENT: Normocephalic atraumatic ?Lungs: Clear ?Cardiovascular: Regular rhythm ?Abdomen nondistended nontender ?Neurological exam ?Awake alert oriented x3 ?Speech is not dysarthric ?No aphasia ?Cranial nerves II to XII intact ?Motor examination with no drift ?Sensation intact light touch ?Coordination with no dysmetria ? ?Medications ? ?Current Facility-Administered Medications:  ?  0.9 %  sodium chloride infusion, 250 mL, Intravenous, PRN, Pokhrel, Laxman, MD ?  acetaminophen (TYLENOL) tablet 650 mg, 650 mg, Oral, Q6H PRN **OR** acetaminophen (TYLENOL) suppository 650 mg, 650 mg, Rectal, Q6H PRN, Pokhrel, Laxman, MD ?  albuterol (PROVENTIL) (2.5 MG/3ML) 0.083% nebulizer solution 2.5 mg, 2.5 mg, Nebulization, Q2H PRN, Pokhrel, Laxman, MD ?  ALPRAZolam Duanne Moron) tablet 0.25 mg, 0.25 mg, Oral, TID PRN, Ralene Muskrat B, MD, 0.25 mg at 08/13/21 0853 ?  amLODipine (NORVASC) tablet 5 mg, 5 mg, Oral, Daily, Sreenath, Sudheer B, MD, 5 mg at 08/13/21 0809 ?  benazepril (LOTENSIN) tablet 20 mg, 20 mg, Oral, Daily, Pokhrel, Laxman, MD, 20 mg at 08/13/21 0810 ?  [START ON 08/14/2021] dexamethasone (DECADRON) tablet 4 mg, 4 mg, Oral, Daily, Sreenath, Sudheer B, MD ?  docusate sodium (COLACE) capsule 100 mg, 100 mg, Oral, BID, Pokhrel, Laxman, MD, 100 mg at 08/11/21 2211 ?  feeding supplement (ENSURE  ENLIVE / ENSURE PLUS) liquid 237 mL, 237 mL, Oral, TID BM, Sreenath, Sudheer B, MD, 237 mL at 08/13/21 0272 ?  finasteride (PROSCAR) tablet 5 mg, 5 mg, Oral, Daily, Pokhrel, Laxman, MD, 5 mg at 08/13/21 0808 ?  gabapentin (NEURONTIN) capsule 300 mg, 300 mg, Oral, QHS, Sreenath, Sudheer B, MD, 300 mg at 08/12/21 2132 ?  guaiFENesin (MUCINEX) 12 hr tablet 600 mg, 600 mg, Oral, BID, Pokhrel, Laxman, MD, 600 mg at 08/13/21 0808 ?  hydrALAZINE (APRESOLINE) injection 10 mg, 10 mg, Intravenous, Q4H PRN, Priscella Mann, Sudheer B, MD, 10 mg at 08/13/21 0820 ?  insulin aspart (novoLOG) injection 0-5 Units, 0-5 Units, Subcutaneous, QHS, Foust, Katy L, NP, 2 Units at 08/12/21 2133 ?  insulin aspart (novoLOG) injection 0-9 Units, 0-9 Units, Subcutaneous, TID WC, Foust, Katy L, NP, 5 Units at 08/13/21 0820 ?  insulin glargine-yfgn (SEMGLEE) injection 5 Units, 5 Units, Subcutaneous, Daily, Ralene Muskrat B, MD, 5 Units at 08/13/21 [phone] ?  ipratropium-albuterol (DUONEB) 0.5-2.5 (3) MG/3ML nebulizer solution 3 mL, 3 mL, Nebulization, BID, Sreenath, Sudheer B, MD, 3 mL at 08/13/21 0720 ?  magnesium oxide (MAG-OX) tablet 400 mg, 400 mg, Oral, Daily, Pokhrel, Laxman, MD, 400 mg at 08/13/21 0809 ?  melatonin tablet 5 mg, 5 mg, Oral, QHS PRN, Sreenath, Sudheer B, MD ?  mirtazapine (REMERON) tablet 15 mg, 15 mg, Oral, QHS, Sreenath, Sudheer B, MD, 15 mg at 08/12/21 2132 ?  multivitamin with minerals tablet 1 tablet, 1 tablet, Oral, Daily, Pokhrel, Laxman, MD, 1 tablet  at 08/13/21 0809 ?  ondansetron (ZOFRAN) tablet 8 mg, 8 mg, Oral, BID PRN, Priscella Mann, Sudheer B, MD ?  oxyCODONE (Oxy IR/ROXICODONE) immediate release tablet 10 mg, 10 mg, Oral, Q6H PRN, Priscella Mann, Sudheer B, MD ?  PARoxetine (PAXIL) tablet 10 mg, 10 mg, Oral, Daily, Sreenath, Sudheer B, MD, 10 mg at 08/13/21 3244 ?  pravastatin (PRAVACHOL) tablet 20 mg, 20 mg, Oral, Daily, Pokhrel, Laxman, MD, 20 mg at 08/13/21 0809 ?  sodium chloride flush (NS) 0.9 % injection 3 mL, 3 mL,  Intravenous, Q12H, Pokhrel, Laxman, MD, 3 mL at 08/13/21 0811 ?  sodium chloride flush (NS) 0.9 % injection 3 mL, 3 mL, Intravenous, Q12H, Pokhrel, Laxman, MD, 3 mL at 08/13/21 0102 ?  sodium chloride flush (NS) 0.9 % injection 3 mL, 3 mL, Intravenous, PRN, Pokhrel, Laxman, MD ?  sodium chloride tablet 2 g, 2 g, Oral, BID WC, Pokhrel, Laxman, MD, 2 g at 08/13/21 0808 ?  tamsulosin (FLOMAX) capsule 0.4 mg, 0.4 mg, Oral, QPC supper, Pokhrel, Laxman, MD, 0.4 mg at 08/12/21 1822 ?Labs ?CBC ?   ?Component Value Date/Time  ? WBC 9.0 08/12/2021 0537  ? RBC 3.21 (L) 08/12/2021 0537  ? HGB 10.9 (L) 08/12/2021 0537  ? HCT 30.5 (L) 08/12/2021 0537  ? PLT 196 08/12/2021 0537  ? MCV 95.0 08/12/2021 0537  ? MCH 34.0 08/12/2021 0537  ? MCHC 35.7 08/12/2021 0537  ? RDW 11.9 08/12/2021 0537  ? LYMPHSABS 0.6 (L) 08/11/2021 0904  ? MONOABS 0.8 08/11/2021 0904  ? EOSABS 0.0 08/11/2021 0904  ? BASOSABS 0.0 08/11/2021 0904  ? ? ?CMP  ?   ?Component Value Date/Time  ? NA 126 (L) 08/13/2021 0406  ? K 3.8 08/13/2021 0406  ? CL 92 (L) 08/13/2021 0406  ? CO2 23 08/13/2021 0406  ? GLUCOSE 186 (H) 08/13/2021 0406  ? BUN 14 08/13/2021 0406  ? CREATININE 0.44 (L) 08/13/2021 0406  ? CALCIUM 8.3 (L) 08/13/2021 0406  ? PROT 5.9 (L) 08/12/2021 0537  ? ALBUMIN 2.8 (L) 08/13/2021 0406  ? AST 31 08/12/2021 0537  ? ALT 24 08/12/2021 0537  ? ALKPHOS 161 (H) 08/12/2021 0537  ? BILITOT 1.6 (H) 08/12/2021 0537  ? GFRNONAA >60 08/13/2021 0406  ? ? ?glycosylated hemoglobin  6.6 ? ?Lipid Panel  ?   ?Component Value Date/Time  ? CHOL 114 08/13/2021 0406  ? TRIG 35 08/13/2021 0406  ? HDL 50 08/13/2021 0406  ? CHOLHDL 2.3 08/13/2021 0406  ? VLDL 7 08/13/2021 0406  ? [place] 57 08/13/2021 0406  ? ?Imaging ?I have reviewed the images obtained: ?CT head: Progressively expansile and conspicuous hypodensity in the high left parietal lobe now involving cortex and subcortical white matter-question petechial hemorrhage versus spared cortex in this region.  Findings  suspicious for interval acute/subacute infarct versus malignancy. ?MRI of the brain confirms restricted diffusion indicating stroke in that area.  MR brain with contrast also suggestive of stroke and not supportive of malignancy ? ?Assessment: 81 year old gentleman with recent diagnosis of lung cancer that is metastatic to lymph nodes and spine comes in for evaluation of weakness and found to be hyponatremic.  During brain imaging performed as a part of work-up for the confusion, noted to have a subacute left parietal occipital infarct-see imaging above. ?CTA head and neck with no ELVO. ICAD seen. ?LDL and A1c within goal. ?No afib on tele. ?Stroke etiology either cardioembolic or hypercoagulability from cancer. No clear etiology determined yet for me to choose anticoagulation for stroke prevention. ?  Will need long term cardiac monitor to look for paroxysmal Afib. ? ?Recommendations: ?-Given that I do not have a clear cardioembolic etiology, I cannot recommend anticoagulation.  If oncology feels strongly that he has a hypercoagulable state, then it may be reasonable to do anticoagulation for stroke prevention, but otherwise I am reluctant to start anticoagulation unless there is evidence of paroxysmal atrial fibrillation.  I have reached out to Dr. Grayland Ormond to see if he has any input on hypercoagulability. ? ?-Since the timing of his last known well is unclear and the acuity of the stroke also is unclear but it is definitely subacute based on imaging (with mild HT), I would start him on dual antiplatelets-aspirin and Plavix for 3 months followed by aspirin only for stroke prevention given the fact that he has intracranial atherosclerosis also is likely etiology of the stroke. ? ?-Given his age greater than 39 years and the fact that his LDL levels are under goal, he can continue on home pravastatin 20 mg daily. ? ?-Outpatient cardiac monitoring to evaluate for any evidence of paroxysmal atrial fibrillation ? ?-PT OT  speech therapy ? ?-Treatment of metastatic malignancy per oncology ? ?Plan d/w Dr. Priscella Mann. I will update recs after hearing back from the oncology team as needed. ? ?ADDENDUM ?Was able to communicate with Dr. Grayland Ormond.

## 2021-08-13 NOTE — Progress Notes (Signed)
?[facility]  ?[phone] [phone] ? ?ID: John Perez OB: 1941/04/03  MR#: [id number]  ZJQ#:[id number] ? ?Patient Care Team: ?Derinda Late, MD as PCP - General (Family Medicine) ?Lloyd Huger, MD as Consulting Physician (Oncology) ?Telford Nab, RN as Sales executive ? ?CHIEF COMPLAINT: Embolic CVA, stage III lung cancer. ? ?INTERVAL HISTORY: Patient significantly improved since admission and is sitting up eating lunch at the time evaluation.  He feels increased weakness and fatigue, but states this is improved.  He offers no further specific complaints. ? ?REVIEW OF SYSTEMS:   ?Review of Systems  ?Constitutional:  Positive for malaise/fatigue. Negative for fever and weight loss.  ?Respiratory: Negative.  Negative for cough and shortness of breath.   ?Cardiovascular: Negative.  Negative for chest pain and leg swelling.  ?Gastrointestinal: Negative.  Negative for abdominal pain.  ?Genitourinary: Negative.  Negative for dysuria.  ?Musculoskeletal: Negative.  Negative for back pain.  ?Skin: Negative.  Negative for rash.  ?Neurological:  Positive for weakness. Negative for dizziness, focal weakness and headaches.  ?Psychiatric/Behavioral: Negative.  The patient is not nervous/anxious.   ? ?As per HPI. Otherwise, a complete review of systems is negative. ? ?PAST MEDICAL HISTORY: ?Past Medical History:  ?Diagnosis Date  ? Diabetes mellitus type 2, insulin dependent ([place])   ? HOH (hard of hearing)   ? Hypercholesteremia   ? Hypertension   ? Hyponatremia   ? ? ?PAST SURGICAL HISTORY: ?Past Surgical History:  ?Procedure Laterality Date  ? CATARACT EXTRACTION W/PHACO Left 02/25/2020  ? Procedure: CATARACT EXTRACTION PHACO AND INTRAOCULAR LENS PLACEMENT ([place]) LEFT DIABETIC;  Surgeon: Eulogio Bear, MD;  Location: [facility];  Service: Ophthalmology;  Laterality: Left;  4.98 ?0:39.4  ? CATARACT EXTRACTION W/PHACO Right 03/17/2020  ? Procedure: CATARACT  EXTRACTION PHACO AND INTRAOCULAR LENS PLACEMENT ([place]) RIGHT DIABETIC;  Surgeon: Eulogio Bear, MD;  Location: [facility];  Service: Ophthalmology;  Laterality: Right;  6.33 ?0:55.3  ? GUM SURGERY    ? HERNIA REPAIR    ? x2  ? ? ?FAMILY HISTORY: ?No family history on file. ? ?ADVANCED DIRECTIVES (Y/N):  @ADVDIR @ ? ?HEALTH MAINTENANCE: ?Social History  ? ?Tobacco Use  ? Smoking status: Former  ?  Packs/day: 1.00  ?  Years: 15.00  ?  Pack years: 15.00  ?  Types: Cigarettes  ?  Quit date: 92  ?  Years since quitting: 33.3  ? Smokeless tobacco: Never  ?Vaping Use  ? Vaping Use: Never used  ?Substance Use Topics  ? Alcohol use: Not Currently  ? ? ? Colonoscopy: ? PAP: ? Bone density: ? Lipid panel: ? ?No Known Allergies ? ?Current Facility-Administered Medications  ?Medication Dose Route Frequency Provider Last Rate Last Admin  ? 0.9 %  sodium chloride infusion  250 mL Intravenous PRN Pokhrel, Laxman, MD      ? acetaminophen (TYLENOL) tablet 650 mg  650 mg Oral Q6H PRN Pokhrel, Laxman, MD      ? Or  ? acetaminophen (TYLENOL) suppository 650 mg  650 mg Rectal Q6H PRN Pokhrel, Laxman, MD      ? albuterol (PROVENTIL) (2.5 MG/3ML) 0.083% nebulizer solution 2.5 mg  2.5 mg Nebulization Q2H PRN Pokhrel, Laxman, MD      ? ALPRAZolam Duanne Moron) tablet 0.25 mg  0.25 mg Oral TID PRN Ralene Muskrat B, MD   0.25 mg at 08/13/21 0853  ? amLODipine (NORVASC) tablet 5 mg  5 mg Oral Daily Sidney Ace, MD  5 mg at 08/13/21 0809  ? aspirin EC tablet 81 mg  81 mg Oral Daily Amie Portland, MD   81 mg at 08/13/21 1342  ? benazepril (LOTENSIN) tablet 20 mg  20 mg Oral Daily Pokhrel, Laxman, MD   20 mg at 08/13/21 0810  ? clopidogrel (PLAVIX) tablet 75 mg  75 mg Oral Daily Amie Portland, MD   75 mg at 08/13/21 1342  ? [START ON 08/14/2021] dexamethasone (DECADRON) tablet 4 mg  4 mg Oral Daily Sreenath, Sudheer B, MD      ? docusate sodium (COLACE) capsule 100 mg  100 mg Oral BID Pokhrel, Laxman, MD   100 mg at 08/11/21 2211   ? feeding supplement (ENSURE ENLIVE / ENSURE PLUS) liquid 237 mL  237 mL Oral TID BM Ralene Muskrat B, MD   237 mL at 08/13/21 1343  ? finasteride (PROSCAR) tablet 5 mg  5 mg Oral Daily Pokhrel, Laxman, MD   5 mg at 08/13/21 7341  ? gabapentin (NEURONTIN) capsule 300 mg  300 mg Oral QHS Ralene Muskrat B, MD   300 mg at 08/12/21 2132  ? guaiFENesin (MUCINEX) 12 hr tablet 600 mg  600 mg Oral BID Pokhrel, Laxman, MD   600 mg at 08/13/21 9379  ? hydrALAZINE (APRESOLINE) injection 10 mg  10 mg Intravenous Q4H PRN Ralene Muskrat B, MD   10 mg at 08/13/21 0820  ? insulin aspart (novoLOG) injection 0-5 Units  0-5 Units Subcutaneous QHS Foust, Katy L, NP   2 Units at 08/12/21 2133  ? insulin aspart (novoLOG) injection 0-9 Units  0-9 Units Subcutaneous TID WC Foust, Katy L, NP   5 Units at 08/13/21 1342  ? insulin glargine-yfgn (SEMGLEE) injection 5 Units  5 Units Subcutaneous Daily Ralene Muskrat B, MD   5 Units at 08/13/21 [phone]  ? ipratropium-albuterol (DUONEB) 0.5-2.5 (3) MG/3ML nebulizer solution 3 mL  3 mL Nebulization BID Priscella Mann, Sudheer B, MD   3 mL at 08/13/21 0720  ? magnesium oxide (MAG-OX) tablet 400 mg  400 mg Oral Daily Pokhrel, Laxman, MD   400 mg at 08/13/21 0809  ? melatonin tablet 5 mg  5 mg Oral QHS PRN Ralene Muskrat B, MD      ? mirtazapine (REMERON) tablet 15 mg  15 mg Oral QHS Ralene Muskrat B, MD   15 mg at 08/12/21 2132  ? multivitamin with minerals tablet 1 tablet  1 tablet Oral Daily Pokhrel, Laxman, MD   1 tablet at 08/13/21 0809  ? ondansetron (ZOFRAN) tablet 8 mg  8 mg Oral BID PRN Ralene Muskrat B, MD      ? oxyCODONE (Oxy IR/ROXICODONE) immediate release tablet 10 mg  10 mg Oral Q6H PRN Sreenath, Sudheer B, MD      ? PARoxetine (PAXIL) tablet 10 mg  10 mg Oral Daily Ralene Muskrat B, MD   10 mg at 08/13/21 9735  ? pravastatin (PRAVACHOL) tablet 20 mg  20 mg Oral Daily Pokhrel, Laxman, MD   20 mg at 08/13/21 0809  ? sodium chloride flush (NS) 0.9 % injection 3 mL  3 mL  Intravenous Q12H Pokhrel, Laxman, MD   3 mL at 08/13/21 0811  ? sodium chloride flush (NS) 0.9 % injection 3 mL  3 mL Intravenous Q12H Pokhrel, Laxman, MD   3 mL at 08/13/21 0811  ? sodium chloride flush (NS) 0.9 % injection 3 mL  3 mL Intravenous PRN Pokhrel, Laxman, MD      ? sodium chloride tablet  2 g  2 g Oral BID WC Pokhrel, Laxman, MD   2 g at 08/13/21 0808  ? tamsulosin (FLOMAX) capsule 0.4 mg  0.4 mg Oral QPC supper Pokhrel, Laxman, MD   0.4 mg at 08/12/21 3888  ? ? ?OBJECTIVE: ?Vitals:  ? 08/13/21 0722 08/13/21 1038  ?BP: (!) 172/98 115/80  ?Pulse: 87   ?Resp: 20   ?Temp: 98.1 ?F (36.7 ?C)   ?SpO2: 97%   ?   Body mass index is 17.37 kg/m?Marland Kitchen    ECOG FS:2 - Symptomatic, <50% confined to bed ? ?General: Thin, no acute distress. ?Eyes: Pink conjunctiva, anicteric sclera. ?HEENT: Normocephalic, moist mucous membranes. ?Lungs: No audible wheezing or coughing. ?Heart: Regular rate and rhythm. ?Abdomen: Soft, nontender, no obvious distention. ?Musculoskeletal: No edema, cyanosis, or clubbing. ?Neuro: Alert, answering all questions appropriately. Cranial nerves grossly intact. ?Skin: No rashes or petechiae noted. ?Psych: Normal affect. ? ?LAB RESULTS: ? ?Lab Results  ?Component Value Date  ? NA 126 (L) 08/13/2021  ? K 3.8 08/13/2021  ? CL 92 (L) 08/13/2021  ? CO2 23 08/13/2021  ? GLUCOSE 186 (H) 08/13/2021  ? BUN 14 08/13/2021  ? CREATININE 0.44 (L) 08/13/2021  ? CALCIUM 8.3 (L) 08/13/2021  ? PROT 5.9 (L) 08/12/2021  ? ALBUMIN 2.8 (L) 08/13/2021  ? AST 31 08/12/2021  ? ALT 24 08/12/2021  ? ALKPHOS 161 (H) 08/12/2021  ? BILITOT 1.6 (H) 08/12/2021  ? GFRNONAA >60 08/13/2021  ? ? ?Lab Results  ?Component Value Date  ? WBC 9.0 08/12/2021  ? NEUTROABS 9.2 (H) 08/11/2021  ? HGB 10.9 (L) 08/12/2021  ? HCT 30.5 (L) 08/12/2021  ? MCV 95.0 08/12/2021  ? PLT 196 08/12/2021  ? ? ? ?STUDIES: ?CT ANGIO HEAD NECK W WO CM ? ?Result Date: 08/12/2021 ?CLINICAL DATA:  Provided history: Neuro deficit, acute, stroke suspected. EXAM: CT  ANGIOGRAPHY HEAD AND NECK TECHNIQUE: Multidetector CT imaging of the head and neck was performed using the standard protocol during bolus administration of intravenous contrast. Multiplanar CT image reco

## 2021-08-13 NOTE — Progress Notes (Signed)
?[facility]  ?ROUNDING NOTE  ? ?Subjective:  ? ?John Perez is a 81 year old male with past medical conditions including hyperlipidemia, diabetes, hypertension, lung cancer, and chronic hyponatremia.  Patient presents to the emergency department with increased weakness and confusion.  Patient has been admitted for Syncope and collapse [R55] ?Brain lesion [G93.9] ?Malignant neoplasm of lung, unspecified laterality, unspecified part of lung ([place]) [C34.90] ?Altered mental status, unspecified altered mental status type [R41.82] ?Acute metabolic encephalopathy [B93.90] ? ?Patient is known to our practice from previous admissions for the same concern.   ? ?Update ?Patient resting quietly, daughter at bedside ?Appetite remains stable, fluid restriction ?Denies nausea and vomiting ?Denies pain and discomfort ? ?Sodium 126 ? ? ?Objective:  ?Vital signs in last 24 hours:  ?Temp:  [97.9 ?F (36.6 ?C)-98.1 ?F (36.7 ?C)] 98.1 ?F (36.7 ?C) (05/04 3009) ?Pulse Rate:  [83-89] 87 (05/04 0722) ?Resp:  [17-20] 20 (05/04 2330) ?BP: (115-180)/(80-98) 115/80 (05/04 1038) ?SpO2:  [93 %-97 %] 97 % (05/04 0722) ? ?Weight change:  ?Filed Weights  ? 08/11/21 0834 08/11/21 1933  ?Weight: 54.9 kg 54.9 kg  ? ? ?Intake/Output: ?I/O last 3 completed shifts: ?In: 30 [P.O.:580] ?Out: -  ?  ?Intake/Output this shift: ? No intake/output data recorded. ? ?Physical Exam: ?General: NAD, resting in bed  ?Head: Normocephalic, atraumatic. Moist oral mucosal membranes  ?Eyes: Anicteric  ?Lungs:  Clear to auscultation, normal effort, room air  ?Heart: Regular rate and rhythm  ?Abdomen:  Soft, nontender, nondistended  ?Extremities: No peripheral edema.  ?Neurologic: Nonfocal, moving all four extremities  ?Skin: No lesions  ?Access: None  ? ? ?Basic Metabolic Panel: ?Recent Labs  ?Lab 08/10/21 ?1433 08/11/21 ?0762 08/12/21 ?2633 08/13/21 ?0406  ?NA 119* 122* 123* 126*  ?K 4.1 3.5 3.8 3.8  ?CL 81* 88* 88* 92*  ?CO2 26 21* 22 23  ?GLUCOSE 197* 218*  316* 186*  ?BUN 16 11 11 14   ?CREATININE 0.57* 0.44* 0.62 0.44*  ?CALCIUM 8.4* 8.0* 8.2* 8.3*  ?MG  --   --  1.6*  --   ?PHOS  --   --   --  2.9  ? ? ? ?Liver Function Tests: ?Recent Labs  ?Lab 08/10/21 ?1433 08/11/21 ?3545 08/12/21 ?6256 08/13/21 ?0406  ?AST 33 31 31  --   ?ALT 24 22 24   --   ?ALKPHOS 192* 167* 161*  --   ?BILITOT 1.4* 1.4* 1.6*  --   ?PROT 6.3* 6.0* 5.9*  --   ?ALBUMIN 3.0* 2.9* 3.0* 2.8*  ? ? ?No results for input(s): LIPASE, AMYLASE in the last 168 hours. ?No results for input(s): AMMONIA in the last 168 hours. ? ?CBC: ?Recent Labs  ?Lab 08/10/21 ?1433 08/11/21 ?3893 08/12/21 ?7342  ?WBC 13.4* 10.8* 9.0  ?NEUTROABS 11.2* 9.2*  --   ?HGB 12.2* 11.8* 10.9*  ?HCT 33.3* 33.4* 30.5*  ?MCV 94.6 97.4 95.0  ?PLT 239 221 196  ? ? ? ?Cardiac Enzymes: ?No results for input(s): CKTOTAL, CKMB, CKMBINDEX, TROPONINI in the last 168 hours. ? ?BNP: ?Invalid input(s): POCBNP ? ?CBG: ?Recent Labs  ?Lab 08/12/21 ?1151 08/12/21 ?1613 08/12/21 ?1816 08/12/21 ?2115 08/13/21 ?0724  ?GLUCAP 318* 240* 398* 240* 257*  ? ? ? ?Microbiology: ?Results for orders placed or performed during the hospital encounter of 06/10/21  ?Urine Culture     Status: Abnormal  ? Collection Time: 06/10/21  3:42 PM  ? Specimen: Urine, Random  ?Result Value Ref Range Status  ? Specimen Description   Final  ?  URINE, RANDOM ?Performed at [facility], [address] [place], [place] [address] ?  ? Special Requests   Final  ?  Normal ?Performed at [facility], [address] [place], [place] [address] ?  ? Culture >=100,000 COLONIES/mL SERRATIA MARCESCENS (A)  Final  ? Report Status 06/13/2021 FINAL  Final  ? Organism ID, Bacteria SERRATIA MARCESCENS (A)  Final  ?    Susceptibility  ? Serratia marcescens - MIC*  ?  CEFAZOLIN >=64 RESISTANT Resistant   ?  CEFEPIME <=0.12 SENSITIVE Sensitive   ?  CEFTRIAXONE <=0.25 SENSITIVE Sensitive   ?  CIPROFLOXACIN <=0.25 SENSITIVE Sensitive   ?  GENTAMICIN <=1 SENSITIVE Sensitive   ?   NITROFURANTOIN 256 RESISTANT Resistant   ?  TRIMETH/SULFA <=20 SENSITIVE Sensitive   ?  * >=100,000 COLONIES/mL SERRATIA MARCESCENS  ?Resp Panel by RT-PCR (Flu A&B, Covid) Nasopharyngeal Swab     Status: None  ? Collection Time: 06/10/21  4:41 PM  ? Specimen: Nasopharyngeal Swab; Nasopharyngeal(NP) swabs in vial transport medium  ?Result Value Ref Range Status  ? SARS Coronavirus 2 by RT PCR NEGATIVE NEGATIVE Final  ?  Comment: (NOTE) ?SARS-CoV-2 target nucleic acids are NOT DETECTED. ? ?The SARS-CoV-2 RNA is generally detectable in upper respiratory ?specimens during the acute phase of infection. The lowest ?concentration of SARS-CoV-2 viral copies this assay can detect is ?138 copies/mL. A negative result does not preclude SARS-Cov-2 ?infection and should not be used as the sole basis for treatment or ?other patient management decisions. A negative result may occur with  ?improper specimen collection/handling, submission of specimen other ?than nasopharyngeal swab, presence of viral mutation(s) within the ?areas targeted by this assay, and inadequate number of viral ?copies(<138 copies/mL). A negative result must be combined with ?clinical observations, patient history, and epidemiological ?information. The expected result is Negative. ? ?Fact Sheet for Patients:  ?EntrepreneurPulse.com.au ? ?Fact Sheet for Healthcare Providers:  ?IncredibleEmployment.be ? ?This test is no t yet approved or cleared by the [place] FDA and  ?has been authorized for detection and/or diagnosis of SARS-CoV-2 by ?FDA under an Emergency Use Authorization (EUA). This EUA will remain  ?in effect (meaning this test can be used) for the duration of the ?COVID-19 declaration under Section 564(b)(1) of the Act, 21 ?U.S.C.section 360bbb-3(b)(1), unless the authorization is terminated  ?or revoked sooner.  ? ? ?  ? Influenza A by PCR NEGATIVE NEGATIVE Final  ? Influenza B by PCR NEGATIVE NEGATIVE Final  ?   Comment: (NOTE) ?The Xpert Xpress SARS-CoV-2/FLU/RSV plus assay is intended as an aid ?in the diagnosis of influenza from Nasopharyngeal swab specimens and ?should not be used as a sole basis for treatment. Nasal washings and ?aspirates are unacceptable for Xpert Xpress SARS-CoV-2/FLU/RSV ?testing. ? ?Fact Sheet for Patients: ?EntrepreneurPulse.com.au ? ?Fact Sheet for Healthcare Providers: ?IncredibleEmployment.be ? ?This test is not yet approved or cleared by the [place] FDA and ?has been authorized for detection and/or diagnosis of SARS-CoV-2 by ?FDA under an Emergency Use Authorization (EUA). This EUA will remain ?in effect (meaning this test can be used) for the duration of the ?COVID-19 declaration under Section 564(b)(1) of the Act, 21 U.S.C. ?section 360bbb-3(b)(1), unless the authorization is terminated or ?revoked. ? ?Performed at [facility], [place], ?[place] [address] ?  ? ? ?Coagulation Studies: ?No results for input(s): LABPROT, INR in the last 72 hours. ? ?Urinalysis: ?Recent Labs  ?  08/11/21 ?1044  ?COLORURINE YELLOW*  ?LABSPEC 1.015  ?  PHURINE 6.0  ?GLUCOSEU 50*  ?HGBUR NEGATIVE  ?BILIRUBINUR NEGATIVE  ?KETONESUR 20*  ?PROTEINUR NEGATIVE  ?NITRITE NEGATIVE  ?LEUKOCYTESUR NEGATIVE  ? ?  ? ? ?Imaging: ?CT ANGIO HEAD NECK W WO CM ? ?Result Date: 08/12/2021 ?CLINICAL DATA:  Provided history: Neuro deficit, acute, stroke suspected. EXAM: CT ANGIOGRAPHY HEAD AND NECK TECHNIQUE: Multidetector CT imaging of the head and neck was performed using the standard protocol during bolus administration of intravenous contrast. Multiplanar CT image reconstructions and MIPs were obtained to evaluate the vascular anatomy. Carotid stenosis measurements (when applicable) are obtained utilizing NASCET criteria, using the distal internal carotid diameter as the denominator. RADIATION DOSE REDUCTION: This exam was performed according to the departmental  dose-optimization program which includes automated exposure control, adjustment of the mA and/or kV according to patient size and/or use of iterative reconstruction technique. CONTRAST:  12mL OMNIPAQUE IO

## 2021-08-13 NOTE — Progress Notes (Signed)
patients daughter Almyra Free is here in the room with patient. she brought up that the mother wont be able to full time care for patient. She is inquiring about other options- adding someone to help at night time. Maybe even a rehab facility. They dont think he will be safe at home. I told her the MD and case manager will be in to see her tomorrow.  ?

## 2021-08-13 NOTE — Care Management (Signed)
Patient will be receiving home health from [facility] as per [place] ?

## 2021-08-14 ENCOUNTER — Ambulatory Visit: Payer: Medicare HMO

## 2021-08-14 DIAGNOSIS — E871 Hypo-osmolality and hyponatremia: Secondary | ICD-10-CM | POA: Diagnosis not present

## 2021-08-14 DIAGNOSIS — C3490 Malignant neoplasm of unspecified part of unspecified bronchus or lung: Secondary | ICD-10-CM | POA: Diagnosis not present

## 2021-08-14 DIAGNOSIS — G9341 Metabolic encephalopathy: Secondary | ICD-10-CM | POA: Diagnosis not present

## 2021-08-14 DIAGNOSIS — Z7189 Other specified counseling: Secondary | ICD-10-CM | POA: Diagnosis not present

## 2021-08-14 LAB — BASIC METABOLIC PANEL
Anion gap: 11 (ref 5–15)
Anion gap: 11 (ref 5–15)
BUN: 14 mg/dL (ref 8–23)
BUN: 17 mg/dL (ref 8–23)
CO2: 23 mmol/L (ref 22–32)
CO2: 21 mmol/L — ABNORMAL LOW (ref 22–32)
Calcium: 8.3 mg/dL — ABNORMAL LOW (ref 8.9–10.3)
Calcium: 8.4 mg/dL — ABNORMAL LOW (ref 8.9–10.3)
Chloride: 89 mmol/L — ABNORMAL LOW (ref 98–111)
Chloride: 90 mmol/L — ABNORMAL LOW (ref 98–111)
Creatinine, Ser: 0.42 mg/dL — ABNORMAL LOW (ref 0.61–1.24)
Creatinine, Ser: 0.43 mg/dL — ABNORMAL LOW (ref 0.61–1.24)
GFR, Estimated: >60 mL/min (ref >60)
GFR, Estimated: >60 mL/min (ref >60)
Glucose, Bld: 284 mg/dL — ABNORMAL HIGH (ref 70–99)
Glucose, Bld: 321 mg/dL — ABNORMAL HIGH (ref 70–99)
Potassium: 3.5 mmol/L (ref 3.5–5.1)
Potassium: 3.6 mmol/L (ref 3.5–5.1)
Sodium: 123 mmol/L — ABNORMAL LOW (ref 135–145)
Sodium: 122 mmol/L — ABNORMAL LOW (ref 135–145)

## 2021-08-14 LAB — HEPATIC FUNCTION PANEL
ALT: 30 U/L (ref 0–44)
AST: 37 U/L (ref 15–41)
Albumin: 3 g/dL — ABNORMAL LOW (ref 3.5–5.0)
Alkaline Phosphatase: 239 U/L — ABNORMAL HIGH (ref 38–126)
Bilirubin, Direct: 0.3 mg/dL — ABNORMAL HIGH (ref 0.0–0.2)
Indirect Bilirubin: 0.6 mg/dL (ref 0.3–0.9)
Total Bilirubin: 0.9 mg/dL (ref 0.3–1.2)
Total Protein: 5.8 g/dL — ABNORMAL LOW (ref 6.5–8.1)

## 2021-08-14 LAB — SODIUM: Sodium: 123 mmol/L — ABNORMAL LOW (ref 135–145)

## 2021-08-14 LAB — GLUCOSE, CAPILLARY
Glucose-Capillary: 309 mg/dL — ABNORMAL HIGH (ref 70–99)
Glucose-Capillary: 265 mg/dL — ABNORMAL HIGH (ref 70–99)
Glucose-Capillary: 66 mg/dL — ABNORMAL LOW (ref 70–99)
Glucose-Capillary: 99 mg/dL (ref 70–99)
Glucose-Capillary: 282 mg/dL — ABNORMAL HIGH (ref 70–99)

## 2021-08-14 MED ORDER — INSULIN ASPART 100 UNIT/ML IJ SOLN
0.0000 [IU] | Freq: Three times a day (TID) | INTRAMUSCULAR | Status: DC
  Administered 2021-08-14 – 2021-08-15 (×3): 8 [IU] via SUBCUTANEOUS
  Administered 2021-08-16 (×2): 5 [IU] via SUBCUTANEOUS
  Administered 2021-08-16: 15 [IU] via SUBCUTANEOUS
  Administered 2021-08-17: 11 [IU] via SUBCUTANEOUS
  Administered 2021-08-17 – 2021-08-18 (×3): 8 [IU] via SUBCUTANEOUS
  Filled 2021-08-14 (×11): qty 1

## 2021-08-14 MED ORDER — TOLVAPTAN 15 MG PO TABS
15.0000 mg | ORAL_TABLET | Freq: Once | ORAL | Status: AC
  Administered 2021-08-14: 15 mg via ORAL
  Filled 2021-08-14: qty 1

## 2021-08-14 MED ORDER — INSULIN GLARGINE-YFGN 100 UNIT/ML ~~LOC~~ SOLN
8.0000 [IU] | Freq: Every day | SUBCUTANEOUS | Status: DC
  Filled 2021-08-14: qty 0.08

## 2021-08-14 NOTE — Care Management Important Message (Signed)
Important Message ? ?Patient Details  ?Name: John Perez ?MRN: [id number] ?Date of Birth: 08-10-1940 ? ? ?Medicare Important Message Given:  Yes ? ? ? ? ?Juliann Pulse A Ming Kunka ?08/14/2021, 2:10 PM ?

## 2021-08-14 NOTE — Progress Notes (Signed)
MEDICATION RELATED CONSULT NOTE - INITIAL  ? ?Pharmacy Consult for Tolvaptan ?Indication: hyponatremia ? ?No Known Allergies ? ?Patient Measurements: ?Height: 5\' 10"  (177.8 cm) ?Weight: 54.9 kg (121 lb 0.5 oz) ?IBW/kg (Calculated) : 73 ? ? ?Labs: ?Recent Labs  ?  08/11/21 ?1044 08/12/21 ?9381 08/13/21 ?0406 08/14/21 ?0175  ?WBC  --  9.0  --   --   ?HGB  --  10.9*  --   --   ?HCT  --  30.5*  --   --   ?PLT  --  196  --   --   ?CREATININE  --  0.62 0.44* 0.42*  ?LABCREA 56  --   --   --   ?MG  --  1.6*  --   --   ?PHOS  --   --  2.9  --   ?ALBUMIN  --  3.0* 2.8*  --   ?PROT  --  5.9*  --   --   ?AST  --  31  --   --   ?ALT  --  24  --   --   ?ALKPHOS  --  161*  --   --   ?BILITOT  --  1.6*  --   --   ? ?Estimated Creatinine Clearance: 56.2 mL/min (A) (by C-G formula based on SCr of 0.42 mg/dL (L)). ? ? ?Medical History: ?Past Medical History:  ?Diagnosis Date  ? Diabetes mellitus type 2, insulin dependent ([place])   ? HOH (hard of hearing)   ? Hypercholesteremia   ? Hypertension   ? Hyponatremia   ? ? ?Assessment: ?81 year old male with past medical conditions including hyperlipidemia, diabetes, hypertension, lung cancer, and chronic hyponatremia.  Patient presented to the emergency department with increased weakness and confusion. Pharmacy consulted for tolvaptan.  ? ?Goal of Therapy:  ?Na WNL ? ?Plan:  ?Tolvaptan 15 mg x 1 dose today ?Notify MD if sodium increases > 8 mEq/L in 8 hours OR  > 12 mEq/L in 24 hours. ? ?John Perez ?08/14/2021,10:14 AM ? ? ? ?

## 2021-08-14 NOTE — Progress Notes (Addendum)
? ?                                                                                                                                                     ?                                                   ?Daily Progress Note  ? ?Patient Name: John Perez       Date: 08/14/2021 ?DOB: [birth year]  Age: 81 y.o. MRN#: [id number] ?Attending Physician: Sidney Ace, MD ?Primary Care Physician: Derinda Late, MD ?Admit Date: 08/11/2021 ? ?Reason for Consultation/Follow-up: Establishing goals of care ? ?Subjective: ?Notes and labs reviewed. Patient is sitting in bedside chair with eyes closed. He opens them when you speak directly to him and closes them following. Wife is at bedside. She has questions regarding treatment plans moving forward. Wife would like to speak with Dr. Grayland Ormond in particular. ? ?Broached GOC. She states they need to revise their living will as it was done years ago. She discusses his weight loss. She states they are unsure if he would want a feeding tube if one is needed. Discussed code status, ventilator support, tracheostomy,  and rehospitalization. The difference between an aggressive medical intervention path and a comfort care path was discussed.  Values and goals of care important to patient and family were attempted to be elicited. ? ?Discussed limitations of medical interventions to prolong quality of life in some situations and discussed the concept of human mortality. Quality of life is very important to them.  ? ?She states their daughter is HPOA. She states they will need to speak about their wishes.  She is aware living will papers can be completed here prior to D/C if they wish.  Blank MOST form was provided to help facilitate conversations as well.  ? ?Length of Stay: 3 ? ?Current Medications: ?Scheduled Meds:  ? amLODipine  5 mg Oral Daily  ? aspirin EC  81 mg Oral Daily  ? benazepril  20 mg Oral Daily  ? clopidogrel  75 mg Oral Daily  ? docusate sodium  100 mg Oral BID  ? feeding  supplement  237 mL Oral TID BM  ? finasteride  5 mg Oral Daily  ? gabapentin  300 mg Oral QHS  ? guaiFENesin  600 mg Oral BID  ? insulin aspart  0-15 Units Subcutaneous TID WC  ? insulin aspart  0-5 Units Subcutaneous QHS  ? [START ON 08/15/2021] insulin glargine-yfgn  8 Units Subcutaneous Daily  ? ipratropium-albuterol  3 mL Nebulization BID  ? magnesium oxide  400 mg Oral Daily  ? mirtazapine  15 mg Oral QHS  ?  multivitamin with minerals  1 tablet Oral Daily  ? PARoxetine  10 mg Oral Daily  ? pravastatin  20 mg Oral Daily  ? sodium chloride flush  3 mL Intravenous Q12H  ? sodium chloride flush  3 mL Intravenous Q12H  ? sodium chloride  2 g Oral BID WC  ? tamsulosin  0.4 mg Oral QPC supper  ? ? ?Continuous Infusions: ? sodium chloride    ? ? ?PRN Meds: ?sodium chloride, acetaminophen **OR** acetaminophen, albuterol, ALPRAZolam, hydrALAZINE, melatonin, ondansetron, oxyCODONE, sodium chloride flush ? ?Physical Exam ?Constitutional:   ?   Comments: Opens eyes temporarily if you speak directly to him.   ?         ? ?Vital Signs: BP (!) 144/86 (BP Location: Left Arm)   Pulse 92   Temp 97.9 ?F (36.6 ?C)   Resp 18   Ht 5\' 10"  (1.778 m)   Wt 54.9 kg   SpO2 96%   BMI 17.37 kg/m?  ?SpO2: SpO2: 96 % ?O2 Device: O2 Device: Room Air ?O2 Flow Rate:   ? ?Intake/output summary: No intake or output data in the 24 hours ending 08/14/21 1439 ?LBM: Last BM Date : 08/13/21 ?Baseline Weight: Weight: 54.9 kg ?Most recent weight: Weight: 54.9 kg ? ? ? ?Patient Active Problem List  ? Diagnosis Date Noted  ? Acute metabolic encephalopathy 38/01/1750  ? Metastatic lung cancer (metastasis from lung to other site) [facility]) 08/11/2021  ? Hypercholesteremia   ? Hypertension   ? Altered mental status   ? Malignant neoplasm of lung ([place])   ? Brain lesion   ? Adenocarcinoma, lung, right ([place]) 07/23/2021  ? Mass of right lung 06/28/2021  ? Hyponatremia 06/10/2021  ? Abnormal brain MRI 06/10/2021  ? Diabetes mellitus type 2, insulin dependent ([place])    ? Abnormal LFTs   ? ? ?Palliative Care Assessment & Plan  ? ?Recommendations/Plan: ? ?Recommend outpatient palliative follow up with Billey Chang NP in cancer center.  ? ? ? ? ?Code Status: ? ?  ?Code Status Orders  ?(From admission, onward)  ?  ? ? ?  ? ?  Start     Ordered  ? 08/11/21 1433  Do not attempt resuscitation (DNR)  Continuous       ?Question Answer Comment  ?In the event of cardiac or respiratory ARREST Do not call a ?code blue?   ?In the event of cardiac or respiratory ARREST Do not perform Intubation, CPR, defibrillation or ACLS   ?In the event of cardiac or respiratory ARREST Use medication by any route, position, wound care, and other measures to relive pain and suffering. May use oxygen, suction and manual treatment of airway obstruction as needed for comfort.   ?  ? 08/11/21 1432  ? ?  ?  ? ?  ? ?Code Status History   ? ? Date Active Date Inactive Code Status Order ID Comments User Context  ? 08/11/2021 1311 08/11/2021 1432 Full Code [id number]  Flora Lipps, MD ED  ? 06/10/2021 1805 06/17/2021 1848 Full Code [id number]  Dwyane Dee, MD ED  ? ?  ? ?Advance Directive Documentation   ? ?Flowsheet Row Most Recent Value  ?Type of Advance Directive Healthcare Power of [place], Living will  ?Pre-existing out of facility DNR order (yellow form or pink MOST form) --  ?"MOST" Form in Place? --  ? ?  ? ? ?Care plan was discussed with: Epic chat sent to Dr. Grayland Ormond.  ? ?Thank you for allowing the Palliative  Medicine Team to assist in the care of this patient. ? ? ?Asencion Gowda, NP ? ?Please contact Palliative Medicine Team phone at [phone] for questions and concerns.  ? ? ? ? ? ?

## 2021-08-14 NOTE — Progress Notes (Signed)
Pharmacist Chemotherapy Monitoring - Initial Assessment   ? ?Anticipated start date: 08/21/21  ? ?The following has been reviewed per standard work regarding the patient's treatment regimen: ?The patient's diagnosis, treatment plan and drug doses, and organ/hematologic function ?Lab orders and baseline tests specific to treatment regimen  ?The treatment plan start date, drug sequencing, and pre-medications ?Prior authorization status  ?Patient's documented medication list, including drug-drug interaction screen and prescriptions for anti-emetics and supportive care specific to the treatment regimen ?The drug concentrations, fluid compatibility, administration routes, and timing of the medications to be used ?The patient's access for treatment and lifetime cumulative dose history, if applicable  ?The patient's medication allergies and previous infusion related reactions, if applicable  ? ?Changes made to treatment plan:  ?treatment plan date ? ?Follow up needed:  ?signing treatment plan ? ? ?John Perez, [facility], ?08/14/2021  11:12 AM  ?

## 2021-08-14 NOTE — Progress Notes (Signed)
Occupational Therapy Treatment ?Patient Details ?Name: John Perez ?MRN: [id number] ?DOB: Aug 21, 1940 ?Today's Date: 08/14/2021 ? ? ?History of present illness Pt is admitted for acute metabolic encephalopathy with acute/subacute L occipital and parietal lobe infarct. Of note, also with + lung mass and recent falls. Other history includes DM, HLD, and HTN. ?  ?OT comments ? Chart reviewed to date, pt greeted in room with daughter and wife present. Tx session targeted improving endurance and strength for improved/safe ADL task completion. Improved cognition noted on this date with pt oriented to self, place, not oriented to date/situation. Pt and family educated re: Economist for STS from chair, safe ADL task completion at home, falls prevention, activities to improve cognition. Pt is making progress towards goal acquisition, will continue to benefit from [facility] to address functional deficits. Family reports they have access to mwc, recommend use if pt with increased physical assist required at home. Pt is left in chair, NAD, all needs met. OT will continue to follow acutely.   ? ?Recommendations for follow up therapy are one component of a multi-disciplinary discharge planning process, led by the attending physician.  Recommendations may be updated based on patient status, additional functional criteria and insurance authorization. ?   ?Follow Up Recommendations ? Home health OT  ?  ?Assistance Recommended at Discharge Frequent or constant Supervision/Assistance  ?Patient can return home with the following ? A little help with walking and/or transfers;A little help with bathing/dressing/bathroom;Assistance with feeding;Assist for transportation;Direct supervision/assist for financial management;Assistance with cooking/housework;Direct supervision/assist for medications management;Help with stairs or ramp for entrance ?  ?Equipment Recommendations ? None recommended by OT;Other (comment) (pt has all recommended  equipment)  ?  ?Recommendations for Other Services   ? ?  ?Precautions / Restrictions Precautions ?Precautions: Fall ?Restrictions ?Weight Bearing Restrictions: No  ? ? ?  ? ?Mobility Bed Mobility ?  ?  ?  ?  ?  ?  ?  ?General bed mobility comments: NT pt in chair ?  ? ?Transfers ?Overall transfer level: Needs assistance ?Equipment used: Rolling walker (2 wheels) ?Transfers: Sit to/from Stand ?Sit to Stand: Min guard ?  ?  ?  ?  ?  ?General transfer comment: 6x with step by step vcs for approrpiate body mechanics ?  ?  ?Balance Overall balance assessment: History of Falls, Needs assistance ?Sitting-balance support: Feet supported ?Sitting balance-Leahy Scale: Good ?  ?  ?Standing balance support: Bilateral upper extremity supported, Reliant on assistive device for balance, During functional activity ?Standing balance-Leahy Scale: Fair ?  ?  ?  ?  ?  ?  ?  ?  ?  ?  ?  ?  ?   ? ?ADL either performed or assessed with clinical judgement  ? ?ADL Overall ADL's : Needs assistance/impaired ?  ?  ?Grooming: Oral care;Standing;Set up ?Grooming Details (indicate cue type and reason): at sink with RW ?  ?  ?  ?  ?  ?  ?  ?  ?Toilet Transfer: Ambulation;Min guard;Rolling walker (2 wheels) ?Toilet Transfer Details (indicate cue type and reason): simulated to bedside chair ?  ?  ?  ?  ?Functional mobility during ADLs: Min guard;Minimal assistance;Rolling walker (2 wheels) (within room RW-HHA) ?  ?  ? ?Extremity/Trunk Assessment   ?  ?  ?  ?  ?  ? ?Vision   ?  ?  ?Perception   ?  ?Praxis   ?  ? ?Cognition   ?Behavior During Therapy: Flat affect ?Overall Cognitive Status:  Impaired/Different from baseline ?Area of Impairment: Orientation, Attention, Memory, Following commands, Safety/judgement, Awareness, Problem solving ?  ?  ?  ?  ?  ?  ?  ?  ?Orientation Level: Disoriented to, Time, Situation ?Current Attention Level: Focused ?Memory: Decreased recall of precautions, Decreased short-term memory ?Following Commands: Follows one  step commands with increased time ?Safety/Judgement: Decreased awareness of safety, Decreased awareness of deficits ?Awareness: Intellectual ?Problem Solving: Slow processing, Decreased initiation, Difficulty sequencing, Requires verbal cues, Requires tactile cues ?  ?  ?  ?   ?Exercises   ? ?  ?Shoulder Instructions   ? ? ?  ?General Comments    ? ? ?Pertinent Vitals/ Pain       Pain Assessment ?Pain Assessment: No/denies pain ? ?Home Living   ?  ?Available Help at Discharge: Family;Friend(s);Available 24 hours/day ?Type of Home: House ?  ?  ?  ?  ?  ?  ?  ?  ?  ?  ?  ?  ?  ?  ? Lives With: Spouse ? ?  ?Prior Functioning/Environment    ?  ?  ?  ?   ? ?Frequency ? Min 2X/week  ? ? ? ? ?  ?Progress Toward Goals ? ?OT Goals(current goals can now be found in the care plan section) ? Progress towards OT goals: Progressing toward goals ? ?Acute Rehab OT Goals ?Patient Stated Goal: go home ?OT Goal Formulation: With patient/family ?Time For Goal Achievement: 08/28/21 ?Potential to Achieve Goals: Good  ?Plan Discharge plan remains appropriate   ? ?Co-evaluation ? ? ?   ?  ?  ?  ?  ? ?  ?AM-PAC OT "6 Clicks" Daily Activity     ?Outcome Measure ? ? Help from another person eating meals?: None ?Help from another person taking care of personal grooming?: A Little ?Help from another person toileting, which includes using toliet, bedpan, or urinal?: A Little ?Help from another person bathing (including washing, rinsing, drying)?: A Lot ?Help from another person to put on and taking off regular upper body clothing?: A Little ?Help from another person to put on and taking off regular lower body clothing?: A Lot ?6 Click Score: 17 ? ?  ?End of Session Equipment Utilized During Treatment: Gait belt;Rolling walker (2 wheels) ? ?OT Visit Diagnosis: Muscle weakness (generalized) (M62.81);Repeated falls (R29.6) ?  ?Activity Tolerance Patient tolerated treatment well ?  ?Patient Left in chair;with call bell/phone within reach;with  family/visitor present ?  ?Nurse Communication Mobility status ?  ? ?   ? ?Time: 1026-1050 ?OT Time Calculation (min): 24 min ? ?Charges: OT General Charges ?$OT Visit: 1 Visit ?OT Treatments ?$Self Care/Home Management : 8-22 mins ?$Therapeutic Activity: 8-22 mins ? ?Shanon Payor, OTD OTR/L  ?08/14/21, 11:52 AM  ?

## 2021-08-14 NOTE — TOC Initial Note (Signed)
Transition of Care (TOC) - Initial/Assessment Note  ? ? ?Patient Details  ?Name: John Perez ?MRN: [id number] ?Date of Birth: 1941-02-20 ? ?Transition of Care (TOC) CM/SW Contact:    ?Pete Pelt, RN ?Phone Number: ?08/14/2021, 9:33 AM ? ?Clinical Narrative:    Patient lives at home with spouse, has adult children.  Son lives across the street, daughter lives nearby and both are very supportive in assisting with caregiving.   ? ?RNCM spoke with spouse and daughter at bedside on assessment, patient was sleeping.     Spouse states she cares for patient at home with the help of her adult children.   However, in the last month, spouse states the caregiving has become more intense and she is concerned she will not be able to   continue to care for him at home.     ? ?RNCM explained that patient ambulated 200 feet around the unit per Physical Therapy notes and patient qualifies for Home Health.  [facility] can provide Physical and Occupational Therapy, RN, Nursing Assistant to assist patient and family at home. Family will also look into private caregivers to assess whether this is an option for them at this time. ? ?Information about [place], and website to explore available resources to assist with care.  Family states that they have been introduced to [place], Palliative Care in [facility] and plan to follow up for support and to establish and monitor goals of care. ? ?Family states they have all needed DME at home at this time. ? ?RNCM contacted hospital chaplain to consult with family and provide further therapeutic listening and spiritual consultation. ? ? ?Expected Discharge Plan: [place] ?Barriers to Discharge: Continued Medical Work up ? ? ?Patient Goals and CMS Choice ?  ?  ?Choice offered to / list presented to : NA ? ?Expected Discharge Plan and Services ?Expected Discharge Plan: [place] ?In-house Referral: Hospice /  Palliative Care ?Discharge Planning Services: CM Consult ?Post Acute Care Choice: Home Health ?Living arrangements for the past 2 months: [place] ?                ?  ?  ?  ?  ?  ?HH Arranged: RN, PT, OT, Nurse's Aide, Social Work ?[place] Agency: [place] ?Date HH Agency Contacted: 08/14/21 ?Time [place]: [phone] ?Representative spoke with at [place]: [place] ? ?Prior Living Arrangements/Services ?Living arrangements for the past 2 months: [place] ?Lives with:: Self, Spouse ?Patient language and need for interpreter reviewed:: Yes (Spoke with family, patient was sleeping, no interpreter required) ?Do you feel safe going back to the place where you live?: Yes      ?Need for Family Participation in Patient Care: Yes (Comment) ?Care giver support system in place?: Yes (comment) ?Current home services: DME ?Criminal Activity/Legal Involvement Pertinent to Current Situation/Hospitalization: No - Comment as needed ? ?Activities of Daily Living ?Home Assistive Devices/Equipment: Chana Bode (specify type), Hearing aid ?ADL Screening (condition at time of admission) ?Patient's cognitive ability adequate to safely complete daily activities?: No ?Is the patient deaf or have difficulty hearing?: Yes ?Does the patient have difficulty seeing, even when wearing glasses/contacts?: Yes ?Does the patient have difficulty concentrating, remembering, or making decisions?: Yes ?Patient able to express need for assistance with ADLs?: Yes ?Does the patient have difficulty dressing or bathing?: Yes ?Independently performs ADLs?: No ?Does the patient have difficulty walking or climbing  stairs?: Yes ?Weakness of Legs: Both ?Weakness of Arms/Hands: None ? ?Permission Sought/Granted ?Permission sought to share information with : Case Manager ?Permission granted to share information with : Yes, Verbal Permission Granted ?   ? Permission granted to share info w AGENCY: [place] ?    ?   ? ?Emotional Assessment ?Appearance:: Appears stated age ?Attitude/Demeanor/Rapport: Lethargic ?  ?Orientation: : Oriented to Self ?Alcohol / Substance Use: Not Applicable ?Psych Involvement: No (comment) ? ?Admission diagnosis:  Syncope and collapse [R55] ?Brain lesion [G93.9] ?Malignant neoplasm of lung, unspecified laterality, unspecified part of lung ([place]) [C34.90] ?Altered mental status, unspecified altered mental status type [R41.82] ?Acute metabolic encephalopathy [G53.64] ?Patient Active Problem List  ? Diagnosis Date Noted  ? Acute metabolic encephalopathy 68/06/2120  ? Metastatic lung cancer (metastasis from lung to other site) [facility]) 08/11/2021  ? Hypercholesteremia   ? Hypertension   ? Altered mental status   ? Malignant neoplasm of lung ([place])   ? Brain lesion   ? Adenocarcinoma, lung, right ([place]) 07/23/2021  ? Mass of right lung 06/28/2021  ? Hyponatremia 06/10/2021  ? Abnormal brain MRI 06/10/2021  ? Diabetes mellitus type 2, insulin dependent ([place])   ? Abnormal LFTs   ? ?PCP:  Derinda Late, MD ?Pharmacy:   ?[id number] [address], [address] ?[address] ?[place] [address] ?Phone: [phone] Fax: [phone] ? ? ? ? ?Social Determinants of Health (SDOH) Interventions ?  ? ?Readmission Risk Interventions ? ?  08/14/2021  ?  9:31 AM  ?Readmission Risk Prevention Plan  ?Transportation Screening Complete  ?Medication Review Press photographer) Complete  ?PCP or Specialist appointment within 3-5 days of discharge Complete  ?[place] or Home Care Consult Complete  ?SW Recovery Care/Counseling Consult Not Complete  ?Palliative Care Screening Complete  ?[place] Not Applicable  ? ? ? ?

## 2021-08-14 NOTE — Progress Notes (Signed)
?[facility]  ?ROUNDING NOTE  ? ?Subjective:  ? ?John Perez is a 81 year old male with past medical conditions including hyperlipidemia, diabetes, hypertension, lung cancer, and chronic hyponatremia.  Patient presents to the emergency department with increased weakness and confusion.  Patient has been admitted for Syncope and collapse [R55] ?Brain lesion [G93.9] ?Malignant neoplasm of lung, unspecified laterality, unspecified part of lung ([place]) [C34.90] ?Altered mental status, unspecified altered mental status type [R41.82] ?Acute metabolic encephalopathy [X38.18] ? ?Patient is known to our practice from previous admissions for the same concern.   ? ?Update ?Patient seen sitting up in chair ?Alert and oriented ?Appetite remains intact, states he's maintaining fluid restriction.  ? ?Sodium 123 ? ? ?Objective:  ?Vital signs in last 24 hours:  ?Temp:  [97.7 ?F (36.5 ?C)-97.9 ?F (36.6 ?C)] 97.9 ?F (36.6 ?C) (05/05 0557) ?Pulse Rate:  [79-92] 92 (05/05 0818) ?Resp:  [16-18] 18 (05/05 0818) ?BP: (119-156)/(77-86) 144/86 (05/05 0818) ?SpO2:  [96 %-99 %] 96 % (05/05 0818) ? ?Weight change:  ?Filed Weights  ? 08/11/21 0834 08/11/21 1933  ?Weight: 54.9 kg 54.9 kg  ? ? ?Intake/Output: ?No intake/output data recorded. ?  ?Intake/Output this shift: ? No intake/output data recorded. ? ?Physical Exam: ?General: NAD,sitting in chair  ?Head: Normocephalic, atraumatic. Moist oral mucosal membranes  ?Eyes: Anicteric  ?Lungs:  Clear to auscultation, normal effort, room air  ?Heart: Regular rate and rhythm  ?Abdomen:  Soft, nontender, nondistended  ?Extremities: No peripheral edema.  ?Neurologic: Nonfocal, moving all four extremities  ?Skin: No lesions  ?Access: None  ? ? ?Basic Metabolic Panel: ?Recent Labs  ?Lab 08/11/21 ?0904 08/12/21 ?2993 08/13/21 ?0406 08/14/21 ?7169 08/14/21 ?1013  ?NA 122* 123* 126* 123* 122*  ?K 3.5 3.8 3.8 3.5 3.6  ?CL 88* 88* 92* 89* 90*  ?CO2 21* 22 23 23  21*  ?GLUCOSE 218* 316* 186* 284* 321*   ?BUN 11 11 14 14 17   ?CREATININE 0.44* 0.62 0.44* 0.42* 0.43*  ?CALCIUM 8.0* 8.2* 8.3* 8.3* 8.4*  ?MG  --  1.6*  --   --   --   ?PHOS  --   --  2.9  --   --   ? ? ? ?Liver Function Tests: ?Recent Labs  ?Lab 08/10/21 ?1433 08/11/21 ?6789 08/12/21 ?3810 08/13/21 ?0406 08/14/21 ?1013  ?AST 33 31 31  --  37  ?ALT 24 22 24   --  30  ?ALKPHOS 192* 167* 161*  --  239*  ?BILITOT 1.4* 1.4* 1.6*  --  0.9  ?PROT 6.3* 6.0* 5.9*  --  5.8*  ?ALBUMIN 3.0* 2.9* 3.0* 2.8* 3.0*  ? ? ?No results for input(s): LIPASE, AMYLASE in the last 168 hours. ?No results for input(s): AMMONIA in the last 168 hours. ? ?CBC: ?Recent Labs  ?Lab 08/10/21 ?1433 08/11/21 ?1751 08/12/21 ?0258  ?WBC 13.4* 10.8* 9.0  ?NEUTROABS 11.2* 9.2*  --   ?HGB 12.2* 11.8* 10.9*  ?HCT 33.3* 33.4* 30.5*  ?MCV 94.6 97.4 95.0  ?PLT 239 221 196  ? ? ? ?Cardiac Enzymes: ?No results for input(s): CKTOTAL, CKMB, CKMBINDEX, TROPONINI in the last 168 hours. ? ?BNP: ?Invalid input(s): POCBNP ? ?CBG: ?Recent Labs  ?Lab 08/13/21 ?0724 08/13/21 ?1214 08/13/21 ?1647 08/13/21 ?2002 08/14/21 ?5277  ?GLUCAP 257* 280* 306* 198* 309*  ? ? ? ?Microbiology: ?Results for orders placed or performed during the hospital encounter of 06/10/21  ?Urine Culture     Status: Abnormal  ? Collection Time: 06/10/21  3:42 PM  ? Specimen: Urine,  Random  ?Result Value Ref Range Status  ? Specimen Description   Final  ?  URINE, RANDOM ?Performed at [facility], [address], [place], [place] [address] ?  ? Special Requests   Final  ?  Normal ?Performed at [facility], [address] [place], [place] [address] ?  ? Culture >=100,000 COLONIES/mL SERRATIA MARCESCENS (A)  Final  ? Report Status 06/13/2021 FINAL  Final  ? Organism ID, Bacteria SERRATIA MARCESCENS (A)  Final  ?    Susceptibility  ? Serratia marcescens - MIC*  ?  CEFAZOLIN >=64 RESISTANT Resistant   ?  CEFEPIME <=0.12 SENSITIVE Sensitive   ?  CEFTRIAXONE <=0.25 SENSITIVE Sensitive   ?  CIPROFLOXACIN <=0.25 SENSITIVE  Sensitive   ?  GENTAMICIN <=1 SENSITIVE Sensitive   ?  NITROFURANTOIN 256 RESISTANT Resistant   ?  TRIMETH/SULFA <=20 SENSITIVE Sensitive   ?  * >=100,000 COLONIES/mL SERRATIA MARCESCENS  ?Resp Panel by RT-PCR (Flu A&B, Covid) Nasopharyngeal Swab     Status: None  ? Collection Time: 06/10/21  4:41 PM  ? Specimen: Nasopharyngeal Swab; Nasopharyngeal(NP) swabs in vial transport medium  ?Result Value Ref Range Status  ? SARS Coronavirus 2 by RT PCR NEGATIVE NEGATIVE Final  ?  Comment: (NOTE) ?SARS-CoV-2 target nucleic acids are NOT DETECTED. ? ?The SARS-CoV-2 RNA is generally detectable in upper respiratory ?specimens during the acute phase of infection. The lowest ?concentration of SARS-CoV-2 viral copies this assay can detect is ?138 copies/mL. A negative result does not preclude SARS-Cov-2 ?infection and should not be used as the sole basis for treatment or ?other patient management decisions. A negative result may occur with  ?improper specimen collection/handling, submission of specimen other ?than nasopharyngeal swab, presence of viral mutation(s) within the ?areas targeted by this assay, and inadequate number of viral ?copies(<138 copies/mL). A negative result must be combined with ?clinical observations, patient history, and epidemiological ?information. The expected result is Negative. ? ?Fact Sheet for Patients:  ?EntrepreneurPulse.com.au ? ?Fact Sheet for Healthcare Providers:  ?IncredibleEmployment.be ? ?This test is no t yet approved or cleared by the [place] FDA and  ?has been authorized for detection and/or diagnosis of SARS-CoV-2 by ?FDA under an Emergency Use Authorization (EUA). This EUA will remain  ?in effect (meaning this test can be used) for the duration of the ?COVID-19 declaration under Section 564(b)(1) of the Act, 21 ?U.S.C.section 360bbb-3(b)(1), unless the authorization is terminated  ?or revoked sooner.  ? ? ?  ? Influenza A by PCR NEGATIVE NEGATIVE  Final  ? Influenza B by PCR NEGATIVE NEGATIVE Final  ?  Comment: (NOTE) ?The Xpert Xpress SARS-CoV-2/FLU/RSV plus assay is intended as an aid ?in the diagnosis of influenza from Nasopharyngeal swab specimens and ?should not be used as a sole basis for treatment. Nasal washings and ?aspirates are unacceptable for Xpert Xpress SARS-CoV-2/FLU/RSV ?testing. ? ?Fact Sheet for Patients: ?EntrepreneurPulse.com.au ? ?Fact Sheet for Healthcare Providers: ?IncredibleEmployment.be ? ?This test is not yet approved or cleared by the [place] FDA and ?has been authorized for detection and/or diagnosis of SARS-CoV-2 by ?FDA under an Emergency Use Authorization (EUA). This EUA will remain ?in effect (meaning this test can be used) for the duration of the ?COVID-19 declaration under Section 564(b)(1) of the Act, 21 U.S.C. ?section 360bbb-3(b)(1), unless the authorization is terminated or ?revoked. ? ?Performed at [facility], [place], ?[place] [address] ?  ? ? ?Coagulation Studies: ?No results for input(s): LABPROT, INR in the last  72 hours. ? ?Urinalysis: ?No results for input(s): COLORURINE, LABSPEC, [place], GLUCOSEU, HGBUR, BILIRUBINUR, KETONESUR, PROTEINUR, UROBILINOGEN, NITRITE, LEUKOCYTESUR in the last 72 hours. ? ?Invalid input(s): APPERANCEUR ?  ? ? ?Imaging: ?CT ANGIO HEAD NECK W WO CM ? ?Result Date: 08/12/2021 ?CLINICAL DATA:  Provided history: Neuro deficit, acute, stroke suspected. EXAM: CT ANGIOGRAPHY HEAD AND NECK TECHNIQUE: Multidetector CT imaging of the head and neck was performed using the standard protocol during bolus administration of intravenous contrast. Multiplanar CT image reconstructions and MIPs were obtained to evaluate the vascular anatomy. Carotid stenosis measurements (when applicable) are obtained utilizing NASCET criteria, using the distal internal carotid diameter as the denominator. RADIATION DOSE REDUCTION: This exam was performed  according to the departmental dose-optimization program which includes automated exposure control, adjustment of the mA and/or kV according to patient size and/or use of iterative reconstruction techniq

## 2021-08-14 NOTE — Progress Notes (Signed)
Pt with blood sugar 66 at 1705. MD notified. Patient at baseline for orientation.  ? ?Patient eating dinner with added orange juice. Recheck at 1735: Blood sugar 99.  ? ?No further actions.  ?

## 2021-08-14 NOTE — Progress Notes (Signed)
?PROGRESS NOTE ? ? ? John Perez  SPQ:[id number] DOB: [birth year] DOA: 08/11/2021 ?PCP: Derinda Late, MD  ? ? ?Brief Narrative:  ?81 y.o. male Patient is a 81 years old male with past medical history of diabetes, hyperlipidemia, hypertension, history of lung cancer who follows up with Dr. Grayland Ormond as outpatient, history of chronic hyponatremia presented to hospital with worsening confusion.  Patient has been more confused than usual and had passed out yesterday.  Patient sodium level was 119 and on repeat in the ED was 122.  Patient was then brought into the hospital for altered mental status.  Patient does have a history of lung cancer and was scheduled to undergo radiation treatment starting today but has presented to the hospital at this time.  Patient's daughter at bedside stated that patient was confused lethargic yesterday and had few falls.  He has been having impaired appetite for the last 2 to 3 days.  Normally was able to ambulate by himself but for the last 2 to 3 days has not been able to do much.  There is no mention of nausea vomiting or diarrhea.  No mention of fever chills or rigor.  No mention of increasing cough or shortness of breath/dyspnea.  Denies any urinary urgency frequency dysuria and but not been drinking much recently.  Patient does have history of chronic hyponatremia and was on salt tablets in the past but was not currently taking it as per the primary care physician. ?  ?In the ED, patient had stable vitals.  UA showed some glucose but no evidence of infection..  Initial BNP was 122.  Potassium was 3.5.  Creatinine of 0.4.  WBC was mildly elevated at 10.8.  Hemoglobin of 11.8.  Chest x-ray showed extensive interstitial and airspace opacities throughout the right lung significantly progressed from previous PET CT scan on 07/07/2021.  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  CT head scan showed progressive expansile and conspicuous hypodensity in the high left parietal lobe.  MRI of the was recommended.  Patient received dexamethasone 10 mg IV in the ED.  Oncology Dr. Tasia Catchings was notified from the ED and patient was consulted for admission to the hospital for further evaluation and treatment. ? ?5/3: MRI reviewed.  Oncology and neurology engaged.  MRI findings consistent with CVA.  No clear evidence of leptomeningeal spread or intracranial metastasis.  Mental status weakness improving.  Nephrology engaged for recommendations regarding hyponatremia treatment ? ?5/4: Patient appears more tremulous this morning, complaining of chills.  Serum sodium improving. ?5/5: Clinically appears improved however unfortunately sodium has not dropped to 122 from 126 yesterday.  Case discussed with nephrology.  We will attempt tolvaptan. ? ? ?Assessment & Plan: ?  ?Principal Problem: ?  Acute metabolic encephalopathy ?Active Problems: ?  Hyponatremia ?  Diabetes mellitus type 2, insulin dependent ([place]) ?  Metastatic lung cancer (metastasis from lung to other site) [facility]) ?  Hypercholesteremia ?  Hypertension ?  Altered mental status ?  Malignant neoplasm of lung ([place]) ?  Brain lesion ? ?Acute metabolic encephalopathy ?Severe symptomatic hyponatremia ?Patient does have chronic hyponatremia ?Sodium on presentation 119 ?Started on salt tablets 2 g twice daily ?Sodium initially improved to 126, now back down to 123 ?Plan: ?Continue salt tabs 2 g twice daily ?  Tolvaptan today ?Can continue oxycodone ?Can continue Paxil ?Appreciate nephrology assistance ? ?Lung cancer with metastasis ?Patient's primary oncologist is Dr. Grayland Ormond ?Patient with extensive tumor burden ?Overall poor prognosis ?No clear radiographic evidence of cerebral  metastasis ?Plan: ?Spoke to daughter at bedside.   ?We will follow-up outpatient with Dr. Grayland Ormond primary oncologist ? ?Type 2 diabetes mellitus with hyperglycemia ?Sugars have been elevated in the setting of steroid use ?Discontinue steroids ?Plan: ?DC Decadron ?Semglee 8 units daily ?Moderate sliding scale ?Carb modified diet ? ?Essential hypertension ?Blood pressure controlled ?Continue benazepril ?Hold Lasix ?IV hydralazine as needed ? ?Hyperlipidemia ?PTA pravastatin ? ?BPH ?PTA Flomax and finasteride ? ?Hyponatremia ?Resolved ?Monitor and replace as necessary ? ? ? ?DVT prophylaxis: SQ Lovenox ?Code Status: DNR ?Family Communication: Daughter at bedside 5/3, 5/4, wife and daughter at bedside 5/5 ?Disposition Plan: Status is: Inpatient ?Remains inpatient appropriate because: Hyponatremia in setting of metastatic lung CA.  Sodium decreased.  Hopeful to discharge in the next 1 to 2 days. ? ? ?Level of care: Med-Surg ? ?Consultants:  ?Oncology ?Nephrology ?Palliative care ?Neurology ? ?Procedures:  ?None ? ?Antimicrobials: ?None ? ? ?Subjective: ?Seen and examined.  Sitting up in the chair.  Family at bedside.  Appears improved this morning ? ?Objective: ?Vitals:  ? 08/13/21 1912 08/13/21 1947 08/14/21 0557 08/14/21 0818  ?BP:  119/77 (!) 156/83 (!) 144/86  ?Pulse:  87 79 92  ?Resp:  16 16 18   ?Temp:  97.7 ?F (36.5 ?C) 97.9 ?F (36.6 ?C)   ?TempSrc:      ?SpO2: 98% 97% 96% 96%  ?Weight:      ?Height:      ? ?No intake or output data in the 24 hours ending 08/14/21 1121 ? ?Filed Weights  ? 08/11/21 0834 08/11/21 1933  ?Weight: 54.9 kg 54.9 kg  ? ? ?Examination: ? ?General exam: No acute distress.  Frail-appearing.  Sitting up in chair ?Respiratory system: Lungs clear.  Normal work of breathing.  Room air ?Cardiovascular system: S1-S2, RRR, no murmurs, no pedal edema ?Gastrointestinal system: Soft, NT/ND, normal bowel sounds ?Central nervous system: Alert and oriented. No focal neurological deficits. ?Extremities:  Symmetric 5 x 5 power. ?Skin: No rashes, lesions or ulcers ?Psychiatry: Judgement and insight appear normal. Mood & affect appropriate.  ? ? ? ?Data Reviewed: I have personally reviewed following labs and imaging studies ? ?CBC: ?Recent Labs  ?Lab 08/10/21 ?1433 08/11/21 ?1638 08/12/21 ?4665  ?WBC 13.4* 10.8* 9.0  ?NEUTROABS 11.2* 9.2*  --   ?HGB 12.2* 11.8* 10.9*  ?HCT 33.3* 33.4* 30.5*  ?MCV 94.6 97.4 95.0  ?PLT 239 221 196  ? ?Basic Metabolic Panel: ?Recent Labs  ?Lab 08/11/21 ?0904 08/12/21 ?9935 08/13/21 ?0406 08/14/21 ?7017 08/14/21 ?1013  ?NA 122* 123* 126* 123* 122*  ?K 3.5 3.8 3.8 3.5 3.6  ?CL 88* 88* 92* 89* 90*  ?CO2 21* 22 23 23  21*  ?GLUCOSE 218* 316* 186* 284* 321*  ?BUN 11 11 14 14 17   ?CREATININE 0.44* 0.62 0.44* 0.42* 0.43*  ?CALCIUM 8.0* 8.2* 8.3* 8.3* 8.4*  ?MG  --  1.6*  --   --   --   ?PHOS  --   --  2.9  --   --   ? ?GFR: ?Estimated Creatinine Clearance: 56.2 mL/min (A) (by C-G formula based on SCr of 0.43 mg/dL (L)). ?Liver Function Tests: ?Recent Labs  ?Lab 08/10/21 ?1433 08/11/21 ?7939 08/12/21 ?0300 08/13/21 ?0406 08/14/21 ?1013  ?AST 33 31 31  --  37  ?  ALT 24 22 24   --  30  ?ALKPHOS 192* 167* 161*  --  239*  ?BILITOT 1.4* 1.4* 1.6*  --  0.9  ?PROT 6.3* 6.0* 5.9*  --  5.8*  ?ALBUMIN 3.0* 2.9* 3.0* 2.8* 3.0*  ? ?No results for input(s): LIPASE, AMYLASE in the last 168 hours. ?No results for input(s): AMMONIA in the last 168 hours. ?Coagulation Profile: ?No results for input(s): INR, PROTIME in the last 168 hours. ?Cardiac Enzymes: ?No results for input(s): CKTOTAL, CKMB, CKMBINDEX, TROPONINI in the last 168 hours. ?BNP (last 3 results) ?No results for input(s): PROBNP in the last 8760 hours. ?HbA1C: ?No results for input(s): HGBA1C in the last 72 hours. ?CBG: ?Recent Labs  ?Lab 08/13/21 ?0724 08/13/21 ?1214 08/13/21 ?1647 08/13/21 ?2002 08/14/21 ?7915  ?GLUCAP 257* 280* 306* 198* 309*  ? ?Lipid Profile: ?Recent Labs  ?  08/13/21 ?0406  ?CHOL 114  ?HDL 50  ?[place] 57  ?TRIG 35  ?CHOLHDL 2.3   ? ?Thyroid Function Tests: ?No results for input(s): TSH, T4TOTAL, FREET4, T3FREE, THYROIDAB in the last 72 hours. ?Anemia Panel: ?No results for input(s): VITAMINB12, FOLATE, FERRITIN, TIBC, IRON, RETICCTPCT in the last

## 2021-08-14 NOTE — Progress Notes (Signed)
Physical Therapy Treatment ?Patient Details ?Name: John Perez ?MRN: [id number] ?DOB: 1940/07/25 ?Today's Date: 08/14/2021 ? ? ?History of Present Illness Pt is admitted for acute metabolic encephalopathy with acute/subacute L occipital and parietal lobe infarct. Of note, also with + lung mass and recent falls. Other history includes DM, HLD, and HTN. ? ?  ?PT Comments  ? ? Pt received sitting upright in chair with family present. Agreeable to PT with family and PT encouragement. Pt supervision to stand to RW tolerating 180' of gait with supervision. Pt requiring VC's throughout treatment for maintaining RW close to pt's BOS for upright positioning to reduced anterior trunk lean/kyphotic posture. Along with VC's with turning RW as pt leaves R foot outside RW BOS leading to tripping hazard. Pt returning to room in chair clearly fatigued but pt did just have OT prior to PT. SPO2 desat to 86% with heavy WOB. Improves with rest and PLB with SPO2 returning to >90% with 1-2 minute rest. Pt's daughter and spouse educated on safety awareness for pt when using RW at home (I.e. keeping pt closer to RW for upright posture and improved balance to reduce ant trunk lean and to assist in optimizing air exchange in lungs, keeping feet inside RW to prevent tripping hazard, and use of visual cue on RW for visual target if pt having difficulty cognitively using RW safely). Pt's family verbalizing understanding. All needs in reach. D/c recs remain appropriate. ?   ?Recommendations for follow up therapy are one component of a multi-disciplinary discharge planning process, led by the attending physician.  Recommendations may be updated based on patient status, additional functional criteria and insurance authorization. ? ?Follow Up Recommendations ? Home health PT ?  ?  ?Assistance Recommended at Discharge Frequent or constant Supervision/Assistance  ?Patient can return home with the following A little help with walking and/or transfers;A  little help with bathing/dressing/bathroom;Assist for transportation ?  ?Equipment Recommendations ? None recommended by PT  ?  ?Recommendations for Other Services   ? ? ?  ?Precautions / Restrictions Precautions ?Precautions: Fall ?Restrictions ?Weight Bearing Restrictions: No  ?  ? ?Mobility ? Bed Mobility ?  ?  ?  ?  ?  ?  ?  ?General bed mobility comments: NT pt in chair ?Patient Response: Cooperative, Flat affect ? ?Transfers ?Overall transfer level: Needs assistance ?Equipment used: Rolling walker (2 wheels) ?Transfers: Sit to/from Stand ?Sit to Stand: Supervision ?  ?  ?  ?  ?  ?General transfer comment: Safe hand placement, good stability standing with supervision ?  ? ?Ambulation/Gait ?Ambulation/Gait assistance: Supervision ?Gait Distance (Feet): 180 Feet ?Assistive device: Rolling walker (2 wheels) ?Gait Pattern/deviations: Step-through pattern ?  ?  ?  ?General Gait Details: Tolerating ambulation in hallway. Remains with RW far outside BOS despite VC's. difficulty keeping feet within BOS of RW with turns. ? ? ?Stairs ?  ?  ?  ?  ?  ? ? ?Wheelchair Mobility ?  ? ?Modified Rankin (Stroke Patients Only) ?  ? ? ?  ?Balance Overall balance assessment: History of Falls, Needs assistance ?Sitting-balance support: Feet supported ?Sitting balance-Leahy Scale: Good ?  ?  ?Standing balance support: Bilateral upper extremity supported, Reliant on assistive device for balance, During functional activity ?Standing balance-Leahy Scale: Fair ?  ?  ?  ?  ?  ?  ?  ?  ?  ?  ?  ?  ?  ? ?  ?Cognition Arousal/Alertness: Lethargic ?Behavior During Therapy: Flat affect ?Overall Cognitive Status: Impaired/Different  from baseline ?  ?  ?  ?  ?  ?  ?  ?  ?  ?  ?Current Attention Level: Focused ?  ?  ?  ?  ?Problem Solving: Slow processing, Decreased initiation, Difficulty sequencing, Requires verbal cues, Requires tactile cues ?  ?  ?  ? ?  ?Exercises Other Exercises ?Other Exercises: Pt's spouse and daughter on posture and safe  use of RW and pt positioning in RW to prevent falls. ? ?  ?General Comments General comments (skin integrity, edema, etc.): SPo2 does desat to 86% on RA post ambulation. Required PLB to return to > 90%. ?  ?  ? ?Pertinent Vitals/Pain Pain Assessment ?Pain Assessment: No/denies pain  ? ? ?Home Living   ?  ?Available Help at Discharge: Family;Friend(s);Available 24 hours/day ?Type of Home: House ?  ?  ?  ?  ?  ?  ?   ?  ?Prior Function    ?  ?  ?   ? ?PT Goals (current goals can now be found in the care plan section) Acute Rehab PT Goals ?Patient Stated Goal: to eat some lunch ?PT Goal Formulation: Patient unable to participate in goal setting ? ?  ?Frequency ? ? ? Min 2X/week ? ? ? ?  ?PT Plan Current plan remains appropriate  ? ? ?Co-evaluation   ?  ?  ?  ?  ? ?  ?AM-PAC PT "6 Clicks" Mobility   ?Outcome Measure ? Help needed turning from your back to your side while in a flat bed without using bedrails?: None ?Help needed moving from lying on your back to sitting on the side of a flat bed without using bedrails?: None ?Help needed moving to and from a bed to a chair (including a wheelchair)?: None ?Help needed standing up from a chair using your arms (e.g., wheelchair or bedside chair)?: A Little ?Help needed to walk in hospital room?: A Little ?Help needed climbing 3-5 steps with a railing? : A Little ?6 Click Score: 21 ? ?  ?End of Session Equipment Utilized During Treatment: Gait belt ?Activity Tolerance: Patient tolerated treatment well ?Patient left: in chair;with family/visitor present ?Nurse Communication: Mobility status ?PT Visit Diagnosis: Unsteadiness on feet (R26.81);Muscle weakness (generalized) (M62.81);History of falling (Z91.81);Difficulty in walking, not elsewhere classified (R26.2) ?  ? ? ?Time: 2878-6767 ?PT Time Calculation (min) (ACUTE ONLY): 13 min ? ?Charges:  $Gait Training: 8-22 mins          ?          ?Salem Caster. Fairly IV, PT, DPT ?Physical Therapist- [place]  ?[facility]  ?08/14/2021, 1:27 PM ? ?

## 2021-08-14 NOTE — Evaluation (Addendum)
Speech Language Pathology Evaluation ?Patient Details ?Name: John Perez ?MRN: [id number] ?DOB: 31-Aug-1940 ?Today's Date: 08/14/2021 ?Time: 6073-7106 ?SLP Time Calculation (min) (ACUTE ONLY): 20 min ? ?Problem List:  ?Patient Active Problem List  ? Diagnosis Date Noted  ? Acute metabolic encephalopathy 26/94/8546  ? Metastatic lung cancer (metastasis from lung to other site) [facility]) 08/11/2021  ? Hypercholesteremia   ? Hypertension   ? Altered mental status   ? Malignant neoplasm of lung ([place])   ? Brain lesion   ? 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 Mass of right lung 06/28/2021  ? Hyponatremia 06/10/2021  ? Abnormal brain MRI 06/10/2021  ? Diabetes mellitus type 2, insulin dependent ([place])   ? Abnormal LFTs   ? ?Past Medical History:  ?Past Medical History:  ?Diagnosis Date  ? Diabetes mellitus type 2, insulin dependent ([place])   ? HOH (hard of hearing)   ? Hypercholesteremia   ? Hypertension   ? Hyponatremia   ? ?Past Surgical History:  ?Past Surgical History:  ?Procedure Laterality Date  ? CATARACT EXTRACTION W/PHACO Left 02/25/2020  ? Procedure: CATARACT EXTRACTION PHACO AND INTRAOCULAR LENS PLACEMENT ([place]) LEFT DIABETIC;  Surgeon: John Bear, MD;  Location: [place];  Service: Ophthalmology;  Laterality: Left;  4.98 ?0:39.4  ? CATARACT EXTRACTION W/PHACO Right 03/17/2020  ? Procedure: CATARACT EXTRACTION PHACO AND INTRAOCULAR LENS PLACEMENT ([place]) RIGHT DIABETIC;  Surgeon: John Bear, MD;  Location: [place];  Service: Ophthalmology;  Laterality: Right;  6.33 ?0:55.3  ? GUM SURGERY    ? HERNIA REPAIR    ? x2  ? ?HPI:  ?Per 57 H&P "John Perez is a 81 y.o. male Patient is a 81 years old male with past medical history of diabetes, hyperlipidemia, hypertension, history of lung cancer who follows up with Dr. Grayland Perez as outpatient, history of chronic hyponatremia presented to hospital with worsening confusion.  Patient has been more confused than usual and had passed  out yesterday.  Patient sodium level was 119 and on repeat in the ED was 122.  Patient was then brought into the hospital for altered mental status.  Patient does have a history of lung cancer and was scheduled to undergo radiation treatment starting today but has presented to the hospital at this time.  Patient's daughter at bedside stated that patient was confused lethargic yesterday and had few falls.  He has been having impaired appetite for the last 2 to 3 days.  Normally was able to ambulate by himself but for the last 2 to 3 days has not been able to do much.  There is no mention of nausea vomiting or diarrhea.  No mention of fever chills or rigor.  No mention of increasing cough or shortness of breath/dyspnea.  Denies any urinary urgency frequency dysuria and but not been drinking much recently.  Patient does have history of chronic hyponatremia and was on salt tablets in the past but was not currently taking it as per the primary care physician.     In the ED, patient had stable vitals.  UA showed some glucose but no evidence of infection..  Initial BNP was 122.  Potassium was 3.5.  Creatinine of 0.4.  WBC was mildly elevated at 10.8.  Hemoglobin of 11.8.  Chest x-ray showed extensive interstitial and airspace opacities throughout the right lung significantly progressed from previous PET CT scan on 07/07/2021.  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  CT head scan showed progressive expansile and conspicuous hypodensity in the high left parietal lobe.  MRI of the was recommended.  Patient received dexamethasone 10 mg IV in the ED.  Oncology Dr. Tasia Perez was notified from the ED and patient was consulted for admission to the hospital for  further evaluation and treatment."  ? ?Assessment / Plan / Recommendation ?Clinical Impression ? Pt seen for cognitive-linguistic evaluation. Pt alert. Kept eyes closed. Seated upright in chair. Wife present and provided additional details of case hx.  ? ?Assessment completed via informal means. Pt with cognitive-lingusitic deficits affecting attention (sustained), orientation (temporal, spatial, situational), memory (immediate, short term, working, functional), problem solving (verbal/non-verbal), executive functioning/reasoning (including sequencing, awareness of errors, and self-correction). Pt with impaired social pragmatics c/b flat affect, keeping eyes closed, and reduced initiation and maintenance of conversation. Pt's speech is confused in content at times; however, no s/sx anomia or dysarthria appreciated. During evaluation, pt benefited from repetition of stimuli and extra time for processing. Perseverative verbal output and motor sequencing noted. ? ?Wife endorsed improving cognition since admission; however, pt continues with fluctuations in cognition.  ? ?Based on today's assessment, anticipate need for frequent/constant supervision and skilled SLP services acute and post-acute.  ? ?SLP to f/u while pt in house for cognitive-linguistic re-evaluation to help determine most appropriate level of assistance and SLP f/u recommendations at d/c.  ? ?Wife, RN, and ROC made aware of results, recommendations and SLP POC. Wife verbalized understanding/agreement. ?   ?SLP Assessment ? SLP Recommendation/Assessment: Patient needs continued [facility] ?SLP Visit Diagnosis: Cognitive communication deficit (R41.841)  ?  ?Recommendations for follow up therapy are one component of a multi-disciplinary discharge planning process, led by the attending physician.  Recommendations may be updated based on patient status, additional functional criteria and insurance authorization. ?   ?Follow Up  Recommendations ? Home health SLP  ?  ?Assistance Recommended at Discharge ? Frequent or constant Supervision/Assistance  ?Functional Status Assessment Patient has had a recent decline in their functional status and demonstrates the ability to make significant improvements in function in a reasonable and predictable amount of time.  ?Frequency and Duration min 2x/week  ?2 weeks ?  ?   ?SLP Evaluation ?Cognition ? Overall Cognitive Status: Impaired/Different from baseline ?Arousal/Alertness:  (alert; kept eyes closed) ?Orientation Level: Oriented to person;Disoriented to place;Disoriented to time;Disoriented to situation ?Year:  (233) ?Memory: Impaired ?Memory Impairment: Storage deficit;Retrieval deficit;Decreased recall of new information ?Awareness: Impaired ?Awareness Impairment: Intellectual impairment ?Problem Solving: Impaired ?Problem Solving Impairment: Verbal basic ?Executive Function: Sequencing;Self Monitoring;Self Correcting ?Sequencing: Impaired ?Sequencing Impairment: Verbal basic;Functional basic ?Self Monitoring: Impaired ?Self Monitoring Impairment: Verbal basic;Functional basic ?Self Correcting: Impaired ?Self Correcting Impairment: Verbal basic;Functional basic ?Behaviors: Perseveration ?Safety/Judgment: Impaired  ?  ?   ?Comprehension ? Auditory Comprehension ?Overall Auditory Comprehension: Impaired ?Yes/No Questions: Impaired ?Basic Immediate Environment Questions: 75-100% accurate ?Complex Questions: 75-100% accurate ?Commands: Impaired ?One Step Basic Commands: 75-100% accurate ?Two Step Basic Commands: 75-100% accurate ?Interfering Components: Processing speed;Hearing;Attention ?EffectiveTechniques: Extra processing time;Increased volume;Repetition ?Visual Recognition/Discrimination ?Discrimination: Not tested ?Reading Comprehension ?Reading Status: Not tested  ?  ?Expression Expression ?Primary Mode of Expression: Verbal ?Verbal Expression ?Overall Verbal Expression:  (confused content;  however, no s/sx anomia or dysarthria)   ?Oral / Motor ? Oral Motor/Sensory Function ?Overall Oral Motor/Sensory Function: Within functional limits ?Motor Speech ?  Overall Motor Speech: Appears within function

## 2021-08-15 ENCOUNTER — Inpatient Hospital Stay: Payer: Medicare HMO

## 2021-08-15 DIAGNOSIS — C3490 Malignant neoplasm of unspecified part of unspecified bronchus or lung: Secondary | ICD-10-CM | POA: Diagnosis not present

## 2021-08-15 DIAGNOSIS — E871 Hypo-osmolality and hyponatremia: Secondary | ICD-10-CM | POA: Diagnosis not present

## 2021-08-15 DIAGNOSIS — G9341 Metabolic encephalopathy: Secondary | ICD-10-CM | POA: Diagnosis not present

## 2021-08-15 LAB — BASIC METABOLIC PANEL
Anion gap: 12 (ref 5–15)
BUN: 15 mg/dL (ref 8–23)
CO2: 22 mmol/L (ref 22–32)
Calcium: 8.6 mg/dL — ABNORMAL LOW (ref 8.9–10.3)
Chloride: 96 mmol/L — ABNORMAL LOW (ref 98–111)
Creatinine, Ser: 0.47 mg/dL — ABNORMAL LOW (ref 0.61–1.24)
GFR, Estimated: >60 mL/min (ref >60)
Glucose, Bld: 246 mg/dL — ABNORMAL HIGH (ref 70–99)
Potassium: 4.2 mmol/L (ref 3.5–5.1)
Sodium: 130 mmol/L — ABNORMAL LOW (ref 135–145)

## 2021-08-15 LAB — CBC WITH DIFFERENTIAL/PLATELET
Abs Immature Granulocytes: 0.26 10*3/uL — ABNORMAL HIGH (ref 0.00–0.07)
Basophils Absolute: 0 10*3/uL (ref 0.0–0.1)
Basophils Relative: 0 %
Eosinophils Absolute: 0 10*3/uL (ref 0.0–0.5)
Eosinophils Relative: 0 %
HCT: 35.1 % — ABNORMAL LOW (ref 39.0–52.0)
Hemoglobin: 12.4 g/dL — ABNORMAL LOW (ref 13.0–17.0)
Immature Granulocytes: 2 %
Lymphocytes Relative: 5 %
Lymphs Abs: 0.6 10*3/uL — ABNORMAL LOW (ref 0.7–4.0)
MCH: 33.9 pg (ref 26.0–34.0)
MCHC: 35.3 g/dL (ref 30.0–36.0)
MCV: 95.9 fL (ref 80.0–100.0)
Monocytes Absolute: 0.8 10*3/uL (ref 0.1–1.0)
Monocytes Relative: 6 %
Neutro Abs: 10.7 10*3/uL — ABNORMAL HIGH (ref 1.7–7.7)
Neutrophils Relative %: 87 %
Platelets: 222 10*3/uL (ref 150–400)
RBC: 3.66 MIL/uL — ABNORMAL LOW (ref 4.22–5.81)
RDW: 12.7 % (ref 11.5–15.5)
WBC: 12.4 10*3/uL — ABNORMAL HIGH (ref 4.0–10.5)
nRBC: 0 % (ref 0.0–0.2)

## 2021-08-15 LAB — GLUCOSE, CAPILLARY
Glucose-Capillary: 262 mg/dL — ABNORMAL HIGH (ref 70–99)
Glucose-Capillary: 287 mg/dL — ABNORMAL HIGH (ref 70–99)
Glucose-Capillary: 50 mg/dL — ABNORMAL LOW (ref 70–99)
Glucose-Capillary: 119 mg/dL — ABNORMAL HIGH (ref 70–99)
Glucose-Capillary: 232 mg/dL — ABNORMAL HIGH (ref 70–99)

## 2021-08-15 LAB — BLOOD GAS, ARTERIAL
Acid-Base Excess: 4.3 mmol/L — ABNORMAL HIGH (ref 0.0–2.0)
Bicarbonate: 26.9 mmol/L (ref 20.0–28.0)
O2 Saturation: 96.3 %
Patient temperature: 37
pCO2 arterial: 33 mmHg (ref 32–48)
pH, Arterial: 7.52 — ABNORMAL HIGH (ref 7.35–7.45)
pO2, Arterial: 67 mmHg — ABNORMAL LOW (ref 83–108)

## 2021-08-15 LAB — SODIUM: Sodium: 129 mmol/L — ABNORMAL LOW (ref 135–145)

## 2021-08-15 MED ORDER — INSULIN GLARGINE-YFGN 100 UNIT/ML ~~LOC~~ SOLN
5.0000 [IU] | Freq: Every day | SUBCUTANEOUS | Status: DC
Start: 2021-08-16 — End: 2021-08-15

## 2021-08-15 MED ORDER — INSULIN GLARGINE-YFGN 100 UNIT/ML ~~LOC~~ SOLN
5.0000 [IU] | Freq: Every day | SUBCUTANEOUS | Status: DC
  Administered 2021-08-15 – 2021-08-17 (×3): 5 [IU] via SUBCUTANEOUS
  Filled 2021-08-15 (×4): qty 0.05

## 2021-08-15 NOTE — Progress Notes (Signed)
Pt with blood sugar  at 1600. MD notified. Patient at baseline for orientation.  ?  ?Patient ate ice cream and orange juice. Recheck at 1631: Blood sugar 119.  ?  ?No further actions.  ?

## 2021-08-15 NOTE — Progress Notes (Signed)
MEDICATION RELATED CONSULT NOTE - INITIAL  ? ?Pharmacy Consult for Tolvaptan ?Indication: hyponatremia ? ?No Known Allergies ? ?Patient Measurements: ?Height: 5\' 10"  (177.8 cm) ?Weight: 54.9 kg (121 lb 0.5 oz) ?IBW/kg (Calculated) : 73 ? ? ?Labs: ?Recent Labs  ?  08/12/21 ?2409 08/13/21 ?0406 08/14/21 ?7353 08/14/21 ?1013  ?WBC 9.0  --   --   --   ?HGB 10.9*  --   --   --   ?HCT 30.5*  --   --   --   ?PLT 196  --   --   --   ?CREATININE 0.62 0.44* 0.42* 0.43*  ?MG 1.6*  --   --   --   ?PHOS  --  2.9  --   --   ?ALBUMIN 3.0* 2.8*  --  3.0*  ?PROT 5.9*  --   --  5.8*  ?AST 31  --   --  37  ?ALT 24  --   --  30  ?ALKPHOS 161*  --   --  239*  ?BILITOT 1.6*  --   --  0.9  ?BILIDIR  --   --   --  0.3*  ?IBILI  --   --   --  0.6  ? ? ?Estimated Creatinine Clearance: 56.2 mL/min (A) (by C-G formula based on SCr of 0.43 mg/dL (L)). ? ? ?Medical History: ?Past Medical History:  ?Diagnosis Date  ? Diabetes mellitus type 2, insulin dependent ([place])   ? HOH (hard of hearing)   ? Hypercholesteremia   ? Hypertension   ? Hyponatremia   ? ? ?Assessment: ?81 year old male with past medical conditions including hyperlipidemia, diabetes, hypertension, lung cancer, and chronic hyponatremia.  Patient presented to the emergency department with increased weakness and confusion. Pharmacy consulted for tolvaptan.  ? ?Goal of Therapy:  ?Na WNL ? ?5/05 1013 Na 122 ?5/05 1751 Na 123 ?5/06 0111 Na 129 ? ?Plan:  ?Tolvaptan 15 mg x 1 dose 5/05 @ 1226 ?Notify MD if sodium increases > 8 mEq/L in 8 hours OR  > 12 mEq/L in 24 hours. ? ?Rylann Munford,Natha S ?08/15/2021,2:20 AM ? ? ? ?

## 2021-08-15 NOTE — Progress Notes (Signed)
?[facility]  ?ROUNDING NOTE  ? ?Subjective:  ? ?Hernan Turnage is a 81 year old male with past medical conditions including hyperlipidemia, diabetes, hypertension, lung cancer, and chronic hyponatremia.  Patient presents to the emergency department with increased weakness and confusion.  Patient has been admitted for Syncope and collapse [R55] ?Brain lesion [G93.9] ?Malignant neoplasm of lung, unspecified laterality, unspecified part of lung ([place]) [C34.90] ?Altered mental status, unspecified altered mental status type [R41.82] ?Acute metabolic encephalopathy [W40.97] ? ?Patient is known to our practice from previous admissions for the same concern.   ? ?Update ?Patient seen sitting up in the bed with wife helping with breakfast ?Alert and oriented ?Appetite remains intact, states he's maintaining fluid restriction.  ? ?Sodium 129 after receiving 10 1 dose of tolvaptan ? ? ?Objective:  ?Vital signs in last 24 hours:  ?Temp:  [97.7 ?F (36.5 ?C)-98.2 ?F (36.8 ?C)] 97.7 ?F (36.5 ?C) (05/06 0730) ?Pulse Rate:  [77-94] 88 (05/06 0730) ?Resp:  [16-20] 16 (05/06 0730) ?BP: (129-154)/(64-85) 129/64 (05/06 0730) ?SpO2:  [94 %-98 %] 97 % (05/06 0730) ?Weight:  [54.6 kg] 54.6 kg (05/06 0500) ? ?Weight change:  ?Filed Weights  ? 08/11/21 0834 08/11/21 1933 08/15/21 0500  ?Weight: 54.9 kg 54.9 kg 54.6 kg  ? ? ?Intake/Output: ?I/O last 3 completed shifts: ?In: -  ?Out: 200 [Urine:200] ?  ?Intake/Output this shift: ? Total I/O ?In: -  ?Out: 275 [Urine:275] ? ?Physical Exam: ?General: NAD,sitting   ?Head: Normocephalic, atraumatic. Moist oral mucosal membranes  ?Eyes: Anicteric  ?Lungs:  Clear to auscultation, normal effort, room air  ?Heart: Regular rate and rhythm  ?Abdomen:  Soft, nontender, nondistended  ?Extremities: No peripheral edema.  ?Neurologic: Nonfocal, moving all four extremities  ?Skin: No lesions  ? ? ?Basic Metabolic Panel: ?Recent Labs  ?Lab 08/11/21 ?0904 08/12/21 ?3532 08/13/21 ?0406 08/14/21 ?9924  08/14/21 ?1013 08/14/21 ?1751 08/15/21 ?0111  ?NA 122* 123* 126* 123* 122* 123* 129*  ?K 3.5 3.8 3.8 3.5 3.6  --   --   ?CL 88* 88* 92* 89* 90*  --   --   ?CO2 21* 22 23 23  21*  --   --   ?GLUCOSE 218* 316* 186* 284* 321*  --   --   ?BUN 11 11 14 14 17   --   --   ?CREATININE 0.44* 0.62 0.44* 0.42* 0.43*  --   --   ?CALCIUM 8.0* 8.2* 8.3* 8.3* 8.4*  --   --   ?MG  --  1.6*  --   --   --   --   --   ?PHOS  --   --  2.9  --   --   --   --   ? ? ? ?Liver Function Tests: ?Recent Labs  ?Lab 08/10/21 ?1433 08/11/21 ?2683 08/12/21 ?4196 08/13/21 ?0406 08/14/21 ?1013  ?AST 33 31 31  --  37  ?ALT 24 22 24   --  30  ?ALKPHOS 192* 167* 161*  --  239*  ?BILITOT 1.4* 1.4* 1.6*  --  0.9  ?PROT 6.3* 6.0* 5.9*  --  5.8*  ?ALBUMIN 3.0* 2.9* 3.0* 2.8* 3.0*  ? ? ?No results for input(s): LIPASE, AMYLASE in the last 168 hours. ?No results for input(s): AMMONIA in the last 168 hours. ? ?CBC: ?Recent Labs  ?Lab 08/10/21 ?1433 08/11/21 ?2229 08/12/21 ?7989  ?WBC 13.4* 10.8* 9.0  ?NEUTROABS 11.2* 9.2*  --   ?HGB 12.2* 11.8* 10.9*  ?HCT 33.3* 33.4* 30.5*  ?MCV 94.6  97.4 95.0  ?PLT 239 221 196  ? ? ? ?Cardiac Enzymes: ?No results for input(s): CKTOTAL, CKMB, CKMBINDEX, TROPONINI in the last 168 hours. ? ?BNP: ?Invalid input(s): POCBNP ? ?CBG: ?Recent Labs  ?Lab 08/14/21 ?1216 08/14/21 ?1705 08/14/21 ?1735 08/14/21 ?2108 08/15/21 ?0354  ?GLUCAP 265* 66* 99 282* 262*  ? ? ? ?Microbiology: ?Results for orders placed or performed during the hospital encounter of 06/10/21  ?Urine Culture     Status: Abnormal  ? Collection Time: 06/10/21  3:42 PM  ? Specimen: Urine, Random  ?Result Value Ref Range Status  ? Specimen Description   Final  ?  URINE, RANDOM ?Performed at [facility], [address] [place], [place] [address] ?  ? Special Requests   Final  ?  Normal ?Performed at [facility], [address] [place],  [address] ?  ? Culture >=100,000 COLONIES/mL SERRATIA MARCESCENS (A)  Final  ? Report Status 06/13/2021  FINAL  Final  ? Organism ID, Bacteria SERRATIA MARCESCENS (A)  Final  ?    Susceptibility  ? Serratia marcescens - MIC*  ?  CEFAZOLIN >=64 RESISTANT Resistant   ?  CEFEPIME <=0.12 SENSITIVE Sensitive   ?  CEFTRIAXONE <=0.25 SENSITIVE Sensitive   ?  CIPROFLOXACIN <=0.25 SENSITIVE Sensitive   ?  GENTAMICIN <=1 SENSITIVE Sensitive   ?  NITROFURANTOIN 256 RESISTANT Resistant   ?  TRIMETH/SULFA <=20 SENSITIVE Sensitive   ?  * >=100,000 COLONIES/mL SERRATIA MARCESCENS  ?Resp Panel by RT-PCR (Flu A&B, Covid) Nasopharyngeal Swab     Status: None  ? Collection Time: 06/10/21  4:41 PM  ? Specimen: Nasopharyngeal Swab; Nasopharyngeal(NP) swabs in vial transport medium  ?Result Value Ref Range Status  ? SARS Coronavirus 2 by RT PCR NEGATIVE NEGATIVE Final  ?  Comment: (NOTE) ?SARS-CoV-2 target nucleic acids are NOT DETECTED. ? ?The SARS-CoV-2 RNA is generally detectable in upper respiratory ?specimens during the acute phase of infection. The lowest ?concentration of SARS-CoV-2 viral copies this assay can detect is ?138 copies/mL. A negative result does not preclude SARS-Cov-2 ?infection and should not be used as the sole basis for treatment or ?other patient management decisions. A negative result may occur with  ?improper specimen collection/handling, submission of specimen other ?than nasopharyngeal swab, presence of viral mutation(s) within the ?areas targeted by this assay, and inadequate number of viral ?copies(<138 copies/mL). A negative result must be combined with ?clinical observations, patient history, and epidemiological ?information. The expected result is Negative. ? ?Fact Sheet for Patients:  ?EntrepreneurPulse.com.au ? ?Fact Sheet for Healthcare Providers:  ?IncredibleEmployment.be ? ?This test is no t yet approved or cleared by the [place] FDA and  ?has been authorized for detection and/or diagnosis of SARS-CoV-2 by ?FDA under an Emergency Use Authorization (EUA). This  EUA will remain  ?in effect (meaning this test can be used) for the duration of the ?COVID-19 declaration under Section 564(b)(1) of the Act, 21 ?U.S.C.section 360bbb-3(b)(1), unless the authorization is terminated  ?or revoked sooner.  ? ? ?  ? Influenza A by PCR NEGATIVE NEGATIVE Final  ? Influenza B by PCR NEGATIVE NEGATIVE Final  ?  Comment: (NOTE) ?The Xpert Xpress SARS-CoV-2/FLU/RSV plus assay is intended as an aid ?in the diagnosis of influenza from Nasopharyngeal swab specimens and ?should not be used as a sole basis for treatment. Nasal washings and ?aspirates are unacceptable for Xpert Xpress SARS-CoV-2/FLU/RSV ?testing. ? ?Fact Sheet for Patients: ?EntrepreneurPulse.com.au ? ?Fact Sheet for Healthcare Providers: ?IncredibleEmployment.be ? ?This test is  not yet approved or cleared by the [place] and ?has been authorized for detection and/or diagnosis of SARS-CoV-2 by ?FDA under an Emergency Use Authorization (EUA). This EUA will remain ?in effect (meaning this test can be used) for the duration of the ?COVID-19 declaration under Section 564(b)(1) of the Act, 21 U.S.C. ?section 360bbb-3(b)(1), unless the authorization is terminated or ?revoked. ? ?Performed at [facility] [place], ?[place] [address] ?  ? ? ?Coagulation Studies: ?No results for input(s): LABPROT, INR in the last 72 hours. ? ?Urinalysis: ?No results for input(s): COLORURINE, LABSPEC, [place], GLUCOSEU, HGBUR, BILIRUBINUR, KETONESUR, PROTEINUR, UROBILINOGEN, NITRITE, LEUKOCYTESUR in the last 72 hours. ? ?Invalid input(s): APPERANCEUR ?  ? ? ?Imaging: ?No results found. ? ? ?Medications:  ? ? sodium chloride    ? ? amLODipine  5 mg Oral Daily  ? aspirin EC  81 mg Oral Daily  ? benazepril  20 mg Oral Daily  ? clopidogrel  75 mg Oral Daily  ? docusate sodium  100 mg Oral BID  ? feeding supplement  237 mL Oral TID BM  ? finasteride  5 mg Oral Daily  ? gabapentin  300 mg Oral  QHS  ? guaiFENesin  600 mg Oral BID  ? insulin aspart  0-15 Units Subcutaneous TID WC  ? insulin aspart  0-5 Units Subcutaneous QHS  ? insulin glargine-yfgn  5 Units Subcutaneous Daily  ? ipratropium-albuterol

## 2021-08-15 NOTE — Progress Notes (Signed)
?PROGRESS NOTE ? ? ? Abir Craine  QMG:[id number] DOB: 1940/09/13 DOA: 08/11/2021 ?PCP: Derinda Late, MD  ? ? ?Brief Narrative:  ?81 y.o. male Patient is a 81 years old male with past medical history of diabetes, hyperlipidemia, hypertension, history of lung cancer who follows up with Dr. Grayland Ormond as outpatient, history of chronic hyponatremia presented to hospital with worsening confusion.  Patient has been more confused than usual and had passed out yesterday.  Patient sodium level was 119 and on repeat in the ED was 122.  Patient was then brought into the hospital for altered mental status.  Patient does have a history of lung cancer and was scheduled to undergo radiation treatment starting today but has presented to the hospital at this time.  Patient's daughter at bedside stated that patient was confused lethargic yesterday and had few falls.  He has been having impaired appetite for the last 2 to 3 days.  Normally was able to ambulate by himself but for the last 2 to 3 days has not been able to do much.  There is no mention of nausea vomiting or diarrhea.  No mention of fever chills or rigor.  No mention of increasing cough or shortness of breath/dyspnea.  Denies any urinary urgency frequency dysuria and but not been drinking much recently.  Patient does have history of chronic hyponatremia and was on salt tablets in the past but was not currently taking it as per the primary care physician. ?  ?In the ED, patient had stable vitals.  UA showed some glucose but no evidence of infection..  Initial BNP was 122.  Potassium was 3.5.  Creatinine of 0.4.  WBC was mildly elevated at 10.8.  Hemoglobin of 11.8.  Chest x-ray showed extensive interstitial and airspace opacities throughout the right lung significantly progressed from previous PET CT scan on 07/07/2021.  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  CT head scan showed progressive expansile and conspicuous hypodensity in the high left parietal lobe.  MRI of the was recommended.  Patient received dexamethasone 10 mg IV in the ED.  Oncology Dr. Tasia Catchings was notified from the ED and patient was consulted for admission to the hospital for further evaluation and treatment. ? ?5/3: MRI reviewed.  Oncology and neurology engaged.  MRI findings consistent with CVA.  No clear evidence of leptomeningeal spread or intracranial metastasis.  Mental status weakness improving.  Nephrology engaged for recommendations regarding hyponatremia treatment ? ?5/4: Patient appears more tremulous this morning, complaining of chills.  Serum sodium improving. ?5/5: Clinically appears improved however unfortunately sodium has not dropped to 122 from 126 yesterday.  Case discussed with nephrology.  We will attempt tolvaptan. ?5/6: More lethargic today.  Serum sodium improved to 129 after tolvaptan ? ? ?Assessment & Plan: ?  ?Principal Problem: ?  Acute metabolic encephalopathy ?Active Problems: ?  Hyponatremia ?  Diabetes mellitus type 2, insulin dependent ([place]) ?  Metastatic lung cancer (metastasis from lung to other site) [facility]) ?  Hypercholesteremia ?  Hypertension ?  Altered mental status ?  Malignant neoplasm of lung ([place]) ?  Brain lesion ? ?Acute metabolic encephalopathy ?Severe symptomatic hyponatremia ?Patient does have chronic hyponatremia ?Sodium on presentation 119 ?Started on salt tablets 2 g twice daily ?Sodium initially improved to 126, now  back down to 123 ?Status post tolvaptan 5/5 ?Sodium 129 as of 5/6 ?Plan: ?Continue salt tabs 2 g twice daily ?No further tolvaptan ?Can continue oxycodone ?Can continue Paxil ?Appreciate nephrology assistance ? ?Lung cancer with metastasis ?Patient's primary oncologist is Dr.  Grayland Ormond ?Patient with extensive tumor burden ?Overall poor prognosis ?No clear radiographic evidence of cerebral metastasis ?Plan: ?Spoke to daughter at bedside.   ?We will follow-up outpatient with Dr. Grayland Ormond primary oncologist ? ?Type 2 diabetes mellitus with hyperglycemia ?Sugars have been elevated in the setting of steroid use ?Discontinued steroids ?Plan: ?DC Decadron ?Semglee 5 units daily ?Moderate sliding scale ?Carb modified diet ? ?Essential hypertension ?Blood pressure controlled ?Continue benazepril ?Hold Lasix ?IV hydralazine as needed ? ?Hyperlipidemia ?PTA pravastatin ? ?BPH ?PTA Flomax and finasteride ? ?Hyponatremia ?Resolved ?Monitor and replace as necessary ? ? ? ?DVT prophylaxis: SQ Lovenox ?Code Status: DNR ?Family Communication: Daughter at bedside 5/3, 5/4, wife and daughter at bedside 5/5, daughter and wife at bedside 5/6 ?Disposition Plan: Status is: Inpatient ?Remains inpatient appropriate because: Hyponatremia in setting of metastatic lung CA.  Sodium decreased.  Mentation worsened today ? ? ?Level of care: Med-Surg ? ?Consultants:  ?Oncology ?Nephrology ?Palliative care ?Neurology ? ?Procedures:  ?None ? ?Antimicrobials: ?None ? ? ?Subjective: ?Seen and examined.  Sitting up in the chair.  Family at bedside.  More weak and lethargic this morning ? ?Objective: ?Vitals:  ? 08/15/21 0121 08/15/21 0500 08/15/21 0606 08/15/21 0730  ?BP: (!) 154/85  132/69 129/64  ?Pulse: 93  77 88  ?Resp: 20  16 16   ?Temp: 98.2 ?F (36.8 ?C)  98 ?F (36.7 ?C) 97.7 ?F (36.5 ?C)  ?TempSrc: Oral  Oral Oral  ?SpO2: 95%  98% 97%  ?Weight:  54.6 kg    ?Height:      ? ? ?Intake/Output Summary (Last 24 hours) at 08/15/2021 1302 ?Last data filed at 08/15/2021 1240 ?Gross per 24 hour  ?Intake --  ?Output 675 ml  ?Net -675 ml  ? ? ?Filed Weights  ? 08/11/21 0834 08/11/21 1933 08/15/21 0500  ?Weight: 54.9 kg 54.9 kg 54.6 kg  ? ? ?Examination: ? ?General exam: No acute distress.  Frail-appearing.  Lying in bed.  Uncomfortable  appearing ?Respiratory system: Lungs clear.  Normal work of breathing.  Room air ?Cardiovascular system: S1-S2, RRR, no murmurs, no pedal edema ?Gastrointestinal system: Soft, NT/ND, normal bowel sounds ?Central nervous system: Alert and oriented. No focal neurological deficits. ?Extremities: Symmetric 5 x 5 power. ?Skin: No rashes, lesions or ulcers ?Psychiatry: Judgement and insight appear normal. Mood & affect appropriate.  ? ? ? ?Data Reviewed: I have personally reviewed following labs and imaging studies ? ?CBC: ?Recent Labs  ?Lab 08/10/21 ?1433 08/11/21 ?7619 08/12/21 ?5093  ?WBC 13.4* 10.8* 9.0  ?NEUTROABS 11.2* 9.2*  --   ?HGB 12.2* 11.8* 10.9*  ?HCT 33.3* 33.4* 30.5*  ?MCV 94.6 97.4 95.0  ?PLT 239 221 196  ? ?Basic Metabolic Panel: ?Recent Labs  ?Lab 08/11/21 ?0904 08/12/21 ?2671 08/13/21 ?0406 08/14/21 ?2458 08/14/21 ?1013 08/14/21 ?1751 08/15/21 ?0111  ?NA 122* 123* 126* 123* 122* 123* 129*  ?K 3.5 3.8 3.8 3.5 3.6  --   --   ?CL 88* 88* 92* 89* 90*  --   --   ?CO2 21* 22 23 23  21*  --   --   ?GLUCOSE 218* 316* 186* 284* 321*  --   --   ?BUN 11 11 14 14 17   --   --   ?CREATININE  0.44* 0.62 0.44* 0.42* 0.43*  --   --   ?CALCIUM 8.0* 8.2* 8.3* 8.3* 8.4*  --   --   ?MG  --  1.6*  --   --   --   --   --   ?PHOS  --   --  2.9  --   --   --   --   ? ?GFR: ?Estimated Creatinine Clearance: 55.9 mL/min (A) (by C-G formula based on SCr of 0.43 mg/dL (L)). ?Liver Function Tests: ?Recent Labs  ?Lab 08/10/21 ?1433 08/11/21 ?2800 08/12/21 ?3491 08/13/21 ?0406 08/14/21 ?1013  ?AST 33 31 31  --  37  ?ALT 24 22 24   --  30  ?ALKPHOS 192* 167* 161*  --  239*  ?BILITOT 1.4* 1.4* 1.6*  --  0.9  ?PROT 6.3* 6.0* 5.9*  --  5.8*  ?ALBUMIN 3.0* 2.9* 3.0* 2.8* 3.0*  ? ?No results for input(s): LIPASE, AMYLASE in the last 168 hours. ?No results for input(s): AMMONIA in the last 168 hours. ?Coagulation Profile: ?No results for input(s): INR, PROTIME in the last 168 hours. ?Cardiac Enzymes: ?No results for input(s): CKTOTAL, CKMB,  CKMBINDEX, TROPONINI in the last 168 hours. ?BNP (last 3 results) ?No results for input(s): PROBNP in the last 8760 hours. ?HbA1C: ?No results for input(s): HGBA1C in the last 72 hours. ?CBG: ?Recent Labs  ?[place]

## 2021-08-16 DIAGNOSIS — G9341 Metabolic encephalopathy: Secondary | ICD-10-CM | POA: Diagnosis not present

## 2021-08-16 DIAGNOSIS — E871 Hypo-osmolality and hyponatremia: Secondary | ICD-10-CM | POA: Diagnosis not present

## 2021-08-16 DIAGNOSIS — C3490 Malignant neoplasm of unspecified part of unspecified bronchus or lung: Secondary | ICD-10-CM | POA: Diagnosis not present

## 2021-08-16 LAB — BASIC METABOLIC PANEL
Anion gap: 11 (ref 5–15)
BUN: 12 mg/dL (ref 8–23)
CO2: 25 mmol/L (ref 22–32)
Calcium: 8.3 mg/dL — ABNORMAL LOW (ref 8.9–10.3)
Chloride: 95 mmol/L — ABNORMAL LOW (ref 98–111)
Creatinine, Ser: 0.5 mg/dL — ABNORMAL LOW (ref 0.61–1.24)
GFR, Estimated: >60 mL/min (ref >60)
Glucose, Bld: 209 mg/dL — ABNORMAL HIGH (ref 70–99)
Potassium: 3.1 mmol/L — ABNORMAL LOW (ref 3.5–5.1)
Sodium: 131 mmol/L — ABNORMAL LOW (ref 135–145)

## 2021-08-16 LAB — GLUCOSE, CAPILLARY
Glucose-Capillary: 230 mg/dL — ABNORMAL HIGH (ref 70–99)
Glucose-Capillary: 404 mg/dL — ABNORMAL HIGH (ref 70–99)
Glucose-Capillary: 229 mg/dL — ABNORMAL HIGH (ref 70–99)
Glucose-Capillary: 75 mg/dL (ref 70–99)

## 2021-08-16 LAB — GLUCOSE, RANDOM: Glucose, Bld: 432 mg/dL — ABNORMAL HIGH (ref 70–99)

## 2021-08-16 MED ORDER — INSULIN ASPART 100 UNIT/ML IJ SOLN
10.0000 [IU] | Freq: Once | INTRAMUSCULAR | Status: AC
  Administered 2021-08-16: 10 [IU] via SUBCUTANEOUS
  Filled 2021-08-16: qty 1

## 2021-08-16 MED ORDER — POTASSIUM CHLORIDE CRYS ER 20 MEQ PO TBCR
40.0000 meq | EXTENDED_RELEASE_TABLET | ORAL | Status: AC
  Administered 2021-08-16 (×2): 40 meq via ORAL
  Filled 2021-08-16 (×2): qty 2

## 2021-08-16 NOTE — Progress Notes (Signed)
CBG 404. Ralene Muskrat, MD paged. See new orders.  ?

## 2021-08-16 NOTE — Progress Notes (Signed)
SLP Cancellation Note ? ?Patient Details ?Name: John Perez ?MRN: [id number] ?DOB: 10-Jun-1940 ? ? ?Cancelled treatment:       Reason Eval/Treat Not Completed: Fatigue/lethargy limiting ability to participate  ? ?Per chart review, pt with increased lethargy on 08/15/21, and pt continues to be lethargic today.  ? ?Will defer cognitive-linguistic re-evaluation at this time.  ? ?Will continue efforts as appropriate. ? ?Cherrie Gauze, M.S., CCC-SLP ?Speech-Language Pathologist ?[facility] ?[phone] ([place])  ? ?Quintella Baton ?08/16/2021, 10:41 AM ?

## 2021-08-16 NOTE — Progress Notes (Signed)
?PROGRESS NOTE ? ? ? John Perez  AYT:[id number] DOB: 03/19/1941 DOA: 08/11/2021 ?PCP: Derinda Late, MD  ? ? ?Brief Narrative:  ?81 y.o. male Patient is a 81 years old male with past medical history of diabetes, hyperlipidemia, hypertension, history of lung cancer who follows up with Dr. Grayland Ormond as outpatient, history of chronic hyponatremia presented to hospital with worsening confusion.  Patient has been more confused than usual and had passed out yesterday.  Patient sodium level was 119 and on repeat in the ED was 122.  Patient was then brought into the hospital for altered mental status.  Patient does have a history of lung cancer and was scheduled to undergo radiation treatment starting today but has presented to the hospital at this time.  Patient's daughter at bedside stated that patient was confused lethargic yesterday and had few falls.  He has been having impaired appetite for the last 2 to 3 days.  Normally was able to ambulate by himself but for the last 2 to 3 days has not been able to do much.  There is no mention of nausea vomiting or diarrhea.  No mention of fever chills or rigor.  No mention of increasing cough or shortness of breath/dyspnea.  Denies any urinary urgency frequency dysuria and but not been drinking much recently.  Patient does have history of chronic hyponatremia and was on salt tablets in the past but was not currently taking it as per the primary care physician. ?  ?In the ED, patient had stable vitals.  UA showed some glucose but no evidence of infection..  Initial BNP was 122.  Potassium was 3.5.  Creatinine of 0.4.  WBC was mildly elevated at 10.8.  Hemoglobin of 11.8.  Chest x-ray showed extensive interstitial and airspace opacities throughout the right lung significantly progressed from previous PET CT scan on 07/07/2021.  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  CT head scan showed progressive expansile and conspicuous hypodensity in the high left parietal lobe.  MRI of the was recommended.  Patient received dexamethasone 10 mg IV in the ED.  Oncology Dr. Tasia Catchings was notified from the ED and patient was consulted for admission to the hospital for further evaluation and treatment. ? ?5/3: MRI reviewed.  Oncology and neurology engaged.  MRI findings consistent with CVA.  No clear evidence of leptomeningeal spread or intracranial metastasis.  Mental status weakness improving.  Nephrology engaged for recommendations regarding hyponatremia treatment ? ?5/4: Patient appears more tremulous this morning, complaining of chills.  Serum sodium improving. ?5/5: Clinically appears improved however unfortunately sodium has not dropped to 122 from 126 yesterday.  Case discussed with nephrology.  We will attempt tolvaptan. ?5/6: More lethargic today.  Serum sodium improved to 129 after tolvaptan ?5/7: Sodium 131.  Patient remains somewhat lethargic.  Son at bedside. ? ? ?Assessment & Plan: ?  ?Principal Problem: ?  Acute metabolic encephalopathy ?Active Problems: ?  Hyponatremia ?  Diabetes mellitus type 2, insulin dependent ([place]) ?  Metastatic lung cancer (metastasis from lung to other site) [facility]) ?  Hypercholesteremia ?  Hypertension ?  Altered mental status ?  Malignant neoplasm of lung ([place]) ?  Brain lesion ? ?Acute metabolic encephalopathy ?Severe symptomatic hyponatremia ?Patient does have chronic hyponatremia ?Sodium on presentation 119 ?Started on  salt tablets 2 g twice daily ?Sodium initially improved to 126, now back down to 123 ?Status post tolvaptan 5/5 ?Sodium 129 as of 5/6 ?Sodium 131 as of 5/7 ?Plan: ?Continue salt tabs 2 g twice daily ?No further tolvaptan ?Can continue oxycodone ?Can continue Paxil ?Appreciate  nephrology assistance ? ?Lung cancer with metastasis ?Patient's primary oncologist is Dr. Grayland Ormond ?Patient with extensive tumor burden ?Overall poor prognosis ?No clear radiographic evidence of cerebral metastasis ?Plan: ?Spoke to family at bedside.   ?We will follow-up outpatient with Dr. Grayland Ormond primary oncologist ? ?Type 2 diabetes mellitus with hyperglycemia ?Sugars have been elevated in the setting of steroid use ?Discontinued steroids ?Plan: ?Semglee 5 units daily ?Moderate sliding scale ?Carb modified diet ?Avoid hypoglycemia ? ?Essential hypertension ?Blood pressure controlled ?Continue benazepril ?Hold Lasix ?IV hydralazine as needed ? ?Hyperlipidemia ?PTA pravastatin ? ?BPH ?PTA Flomax and finasteride ? ?Hyponatremia ?Resolved ?Monitor and replace as necessary ? ? ? ?DVT prophylaxis: SQ Lovenox ?Code Status: DNR ?Family Communication: Daughter at bedside 5/3, 5/4, wife and daughter at bedside 5/5, daughter and wife at bedside 5/6, son at bedside 5/7 ?Disposition Plan: Status is: Inpatient ?Remains inpatient appropriate because: Hyponatremia in setting of metastatic lung CA. sodium levels improving.  Mentation and energy level poor ? ?Level of care: Med-Surg ? ?Consultants:  ?Oncology ?Nephrology ?Palliative care ?Neurology ? ?Procedures:  ?None ? ?Antimicrobials: ?None ? ? ?Subjective: ?Seen and examined.  Patient napping.  Son at bedside.  More weak and lethargic this morning ? ?Objective: ?Vitals:  ? 08/15/21 2005 08/16/21 0500 08/16/21 0500 08/16/21 0815  ?BP: (!) 141/75  139/79 (!) 141/87  ?Pulse: 92  88 89  ?Resp: 17  16 20   ?Temp: 99.2 ?F (37.3 ?C)  98.7 ?F (37.1 ?C) (!) 97.5 ?F (36.4 ?C)  ?TempSrc:    Oral  ?SpO2: 94%  94% 93%  ?Weight:  53.4 kg    ?Height:      ? ? ?Intake/Output Summary (Last 24 hours) at 08/16/2021 0949 ?Last data filed at 08/16/2021 0913 ?Gross per 24 hour  ?Intake 123 ml  ?Output 1265 ml  ?Net -1142 ml  ? ? ?Filed Weights  ? 08/11/21 1933 08/15/21 0500 08/16/21 0500  ?Weight:  54.9 kg 54.6 kg 53.4 kg  ? ? ?Examination: ? ?General exam: No acute distress.  Appears frail ?Respiratory system: Lungs clear.  Normal work of breathing.  Room air ?Cardiovascular system: S1-S2, RRR, no murmurs, no pedal edema ?Gastrointestinal system: Soft, NT/ND, normal bowel sounds ?Central nervous system: Alert and oriented. No focal neurological deficits. ?Extremities: Symmetric 5 x 5 power. ?Skin: No rashes, lesions or ulcers ?Psychiatry: Judgement and insight appear normal. Mood & affect appropriate.  ? ? ? ?Data Reviewed: I have personally reviewed following labs and imaging studies ? ?CBC: ?Recent Labs  ?Lab 08/10/21 ?1433 08/11/21 ?1497 08/12/21 ?0263 08/15/21 ?1246  ?WBC 13.4* 10.8* 9.0 12.4*  ?NEUTROABS 11.2* 9.2*  --  10.7*  ?HGB 12.2* 11.8* 10.9* 12.4*  ?HCT 33.3* 33.4* 30.5* 35.1*  ?MCV 94.6 97.4 95.0 95.9  ?PLT 239 221 196 222  ? ?Basic Metabolic Panel: ?Recent Labs  ?Lab 08/12/21 ?7858 08/13/21 ?0406 08/14/21 ?8502 08/14/21 ?1013 08/14/21 ?1751 08/15/21 ?0111 08/15/21 ?1246 08/16/21 ?0749  ?NA 123* 126* 123* 122* 123* 129* 130* 131*  ?K 3.8 3.8 3.5 3.6  --   --  4.2 3.1*  ?CL 88* 92* 89* 90*  --   --  96* 95*  ?CO2 22 23 23  21*  --   --  22 25  ?GLUCOSE 316* 186* 284* 321*  --   --  246* 209*  ?BUN 11 14 14 17   --   --  15 12  ?CREATININE 0.62 0.44* 0.42* 0.43*  --   --  0.47* 0.50*  ?CALCIUM 8.2* 8.3* 8.3* 8.4*  --   --  8.6* 8.3*  ?MG 1.6*  --   --   --   --   --   --   --   ?PHOS  --  2.9  --   --   --   --   --   --   ? ?GFR: ?Estimated Creatinine Clearance: 54.7 mL/min (A) (by C-G formula based on SCr of 0.5 mg/dL (L)). ?Liver Function Tests: ?Recent Labs  ?Lab 08/10/21 ?1433 08/11/21 ?8527 08/12/21 ?7824 08/13/21 ?0406 08/14/21 ?1013  ?AST 33 31 31  --  37  ?ALT 24 22 24   --  30  ?ALKPHOS 192* 167* 161*  --  239*  ?BILITOT 1.4* 1.4* 1.6*  --  0.9  ?PROT 6.3* 6.0* 5.9*  --  5.8*  ?ALBUMIN 3.0* 2.9* 3.0* 2.8* 3.0*  ? ?No results for input(s): LIPASE, AMYLASE in the last 168 hours. ?No results  for input(s): AMMONIA in the last 168 hours. ?Coagulation Profile: ?No results for input(s): INR, PROTIME in the last 168 hours. ?Cardiac Enzymes: ?No results for input(s): CKTOTAL, CKMB, CKMBINDEX, TROPONI

## 2021-08-16 NOTE — Progress Notes (Signed)
?[facility]  ?ROUNDING NOTE  ? ?Subjective:  ? ?John Perez is a 81 year old male with past medical conditions including hyperlipidemia, diabetes, hypertension, lung cancer, and chronic hyponatremia.  Patient presents to the emergency department with increased weakness and confusion.  Patient has been admitted for Syncope and collapse [R55] ?Brain lesion [G93.9] ?Malignant neoplasm of lung, unspecified laterality, unspecified part of lung ([place]) [C34.90] ?Altered mental status, unspecified altered mental status type [R41.82] ?Acute metabolic encephalopathy [J09.32] ? ?Patient is known to our practice from previous admissions for the same concern.   ? ?Update ?Family at bedside ?Patient is napping this AM. Has not had breakfast yet ? ? ?Objective:  ?Vital signs in last 24 hours:  ?Temp:  [97.5 ?F (36.4 ?C)-99.2 ?F (37.3 ?C)] 97.5 ?F (36.4 ?C) (05/07 0815) ?Pulse Rate:  [84-92] 89 (05/07 0815) ?Resp:  [16-20] 20 (05/07 0815) ?BP: (134-141)/(75-87) 141/87 (05/07 0815) ?SpO2:  [93 %-94 %] 93 % (05/07 0815) ?Weight:  [53.4 kg] 53.4 kg (05/07 0500) ? ?Weight change: -1.2 kg ?Filed Weights  ? 08/11/21 1933 08/15/21 0500 08/16/21 0500  ?Weight: 54.9 kg 54.6 kg 53.4 kg  ? ? ?Intake/Output: ?I/O last 3 completed shifts: ?In: 120 [P.O.:120] ?Out: [place] [[id number]] ?  ?Intake/Output this shift: ? No intake/output data recorded. ? ?Physical Exam: ?General: NAD,  ?Head: Moist oral mucosal membranes  ?Eyes: Anicteric  ?Lungs:  Clear to auscultation, normal effort, room air  ?Heart: Regular rate and rhythm  ?Abdomen:  Soft, nontender, nondistended  ?Extremities: No peripheral edema.  ?Neurologic: Resting quietly this am  ?Skin: No lesions  ? ? ?Basic Metabolic Panel: ?Recent Labs  ?Lab 08/12/21 ?9833 08/13/21 ?0406 08/14/21 ?8250 08/14/21 ?1013 08/14/21 ?1751 08/15/21 ?0111 08/15/21 ?1246 08/16/21 ?0749  ?NA 123* 126* 123* 122* 123* 129* 130* 131*  ?K 3.8 3.8 3.5 3.6  --   --  4.2 3.1*  ?CL 88* 92* 89* 90*  --   --   96* 95*  ?CO2 22 23 23  21*  --   --  22 25  ?GLUCOSE 316* 186* 284* 321*  --   --  246* 209*  ?BUN 11 14 14 17   --   --  15 12  ?CREATININE 0.62 0.44* 0.42* 0.43*  --   --  0.47* 0.50*  ?CALCIUM 8.2* 8.3* 8.3* 8.4*  --   --  8.6* 8.3*  ?MG 1.6*  --   --   --   --   --   --   --   ?PHOS  --  2.9  --   --   --   --   --   --   ? ? ? ?Liver Function Tests: ?Recent Labs  ?Lab 08/10/21 ?1433 08/11/21 ?5397 08/12/21 ?6734 08/13/21 ?0406 08/14/21 ?1013  ?AST 33 31 31  --  37  ?ALT 24 22 24   --  30  ?ALKPHOS 192* 167* 161*  --  239*  ?BILITOT 1.4* 1.4* 1.6*  --  0.9  ?PROT 6.3* 6.0* 5.9*  --  5.8*  ?ALBUMIN 3.0* 2.9* 3.0* 2.8* 3.0*  ? ? ?No results for input(s): LIPASE, AMYLASE in the last 168 hours. ?No results for input(s): AMMONIA in the last 168 hours. ? ?CBC: ?Recent Labs  ?Lab 08/10/21 ?1433 08/11/21 ?1937 08/12/21 ?9024 08/15/21 ?1246  ?WBC 13.4* 10.8* 9.0 12.4*  ?NEUTROABS 11.2* 9.2*  --  10.7*  ?HGB 12.2* 11.8* 10.9* 12.4*  ?HCT 33.3* 33.4* 30.5* 35.1*  ?MCV 94.6 97.4 95.0 95.9  ?PLT 239  221 196 222  ? ? ? ?Cardiac Enzymes: ?No results for input(s): CKTOTAL, CKMB, CKMBINDEX, TROPONINI in the last 168 hours. ? ?BNP: ?Invalid input(s): POCBNP ? ?CBG: ?Recent Labs  ?Lab 08/15/21 ?1205 08/15/21 ?1600 08/15/21 ?1631 08/15/21 ?2104 08/16/21 ?4098  ?GLUCAP 287* 50* 119* 232* 230*  ? ? ? ?Microbiology: ?Results for orders placed or performed during the hospital encounter of 06/10/21  ?Urine Culture     Status: Abnormal  ? Collection Time: 06/10/21  3:42 PM  ? Specimen: Urine, Random  ?Result Value Ref Range Status  ? Specimen Description   Final  ?  URINE, RANDOM ?Performed at [facility], [address] [place], [place] [address] ?  ? Special Requests   Final  ?  Normal ?Performed at [facility], [address] [place], [place] [address] ?  ? Culture >=100,000 COLONIES/mL SERRATIA MARCESCENS (A)  Final  ? Report Status 06/13/2021 FINAL  Final  ? Organism ID, Bacteria SERRATIA MARCESCENS (A)  Final   ?    Susceptibility  ? Serratia marcescens - MIC*  ?  CEFAZOLIN >=64 RESISTANT Resistant   ?  CEFEPIME <=0.12 SENSITIVE Sensitive   ?  CEFTRIAXONE <=0.25 SENSITIVE Sensitive   ?  CIPROFLOXACIN <=0.25 SENSITIVE Sensitive   ?  GENTAMICIN <=1 SENSITIVE Sensitive   ?  NITROFURANTOIN 256 RESISTANT Resistant   ?  TRIMETH/SULFA <=20 SENSITIVE Sensitive   ?  * >=100,000 COLONIES/mL SERRATIA MARCESCENS  ?Resp Panel by RT-PCR (Flu A&B, Covid) Nasopharyngeal Swab     Status: None  ? Collection Time: 06/10/21  4:41 PM  ? Specimen: Nasopharyngeal Swab; Nasopharyngeal(NP) swabs in vial transport medium  ?Result Value Ref Range Status  ? SARS Coronavirus 2 by RT PCR NEGATIVE NEGATIVE Final  ?  Comment: (NOTE) ?SARS-CoV-2 target nucleic acids are NOT DETECTED. ? ?The SARS-CoV-2 RNA is generally detectable in upper respiratory ?specimens during the acute phase of infection. The lowest ?concentration of SARS-CoV-2 viral copies this assay can detect is ?138 copies/mL. A negative result does not preclude SARS-Cov-2 ?infection and should not be used as the sole basis for treatment or ?other patient management decisions. A negative result may occur with  ?improper specimen collection/handling, submission of specimen other ?than nasopharyngeal swab, presence of viral mutation(s) within the ?areas targeted by this assay, and inadequate number of viral ?copies(<138 copies/mL). A negative result must be combined with ?clinical observations, patient history, and epidemiological ?information. The expected result is Negative. ? ?Fact Sheet for Patients:  ?EntrepreneurPulse.com.au ? ?Fact Sheet for Healthcare Providers:  ?IncredibleEmployment.be ? ?This test is no t yet approved or cleared by the [place] FDA and  ?has been authorized for detection and/or diagnosis of SARS-CoV-2 by ?FDA under an Emergency Use Authorization (EUA). This EUA will remain  ?in effect (meaning this test can be used) for the  duration of the ?COVID-19 declaration under Section 564(b)(1) of the Act, 21 ?U.S.C.section 360bbb-3(b)(1), unless the authorization is terminated  ?or revoked sooner.  ? ? ?  ? Influenza A by PCR NEGATIVE NEGATIVE Final  ? Influenza B by PCR NEGATIVE NEGATIVE Final  ?  Comment: (NOTE) ?The Xpert Xpress SARS-CoV-2/FLU/RSV plus assay is intended as an aid ?in the diagnosis of influenza from Nasopharyngeal swab specimens and ?should not be used as a sole basis for treatment. Nasal washings and ?aspirates are unacceptable for Xpert Xpress SARS-CoV-2/FLU/RSV ?testing. ? ?Fact Sheet for Patients: ?EntrepreneurPulse.com.au ? ?Fact Sheet for Healthcare Providers: ?IncredibleEmployment.be ? ?This test is not yet approved or  cleared by the [place] and ?has been authorized for detection and/or diagnosis of SARS-CoV-2 by ?FDA under an Emergency Use Authorization (EUA). This EUA will remain ?in effect (meaning this test can be used) for the duration of the ?COVID-19 declaration under Section 564(b)(1) of the Act, 21 U.S.C. ?section 360bbb-3(b)(1), unless the authorization is terminated or ?revoked. ? ?Performed at [facility], [place], ?[place] [address] ?  ? ? ?Coagulation Studies: ?No results for input(s): LABPROT, INR in the last 72 hours. ? ?Urinalysis: ?No results for input(s): COLORURINE, LABSPEC, [place], GLUCOSEU, HGBUR, BILIRUBINUR, KETONESUR, PROTEINUR, UROBILINOGEN, NITRITE, LEUKOCYTESUR in the last 72 hours. ? ?Invalid input(s): APPERANCEUR ?  ? ? ?Imaging: ?CT HEAD WO CONTRAST (5MM) ? ?Result Date: 08/15/2021 ?CLINICAL DATA:  Mental status change. EXAM: CT HEAD WITHOUT CONTRAST TECHNIQUE: Contiguous axial images were obtained from the base of the skull through the vertex without intravenous contrast. RADIATION DOSE REDUCTION: This exam was performed according to the departmental dose-optimization program which includes automated exposure control,  adjustment of the mA and/or kV according to patient size and/or use of iterative reconstruction technique. COMPARISON:  Multiple previous MRI brain and head CTs. FINDINGS: Brain: Ventricles and cisterns a

## 2021-08-16 NOTE — Plan of Care (Signed)
  Problem: Clinical Measurements: Goal: Respiratory complications will improve Outcome: Progressing Goal: Cardiovascular complication will be avoided Outcome: Progressing   Problem: Pain Managment: Goal: General experience of comfort will improve Outcome: Progressing   Problem: Safety: Goal: Ability to remain free from injury will improve Outcome: Progressing   Problem: Skin Integrity: Goal: Risk for impaired skin integrity will decrease Outcome: Progressing   

## 2021-08-17 ENCOUNTER — Inpatient Hospital Stay: Payer: Medicare HMO

## 2021-08-17 ENCOUNTER — Ambulatory Visit: Payer: Medicare HMO

## 2021-08-17 DIAGNOSIS — E871 Hypo-osmolality and hyponatremia: Secondary | ICD-10-CM | POA: Diagnosis not present

## 2021-08-17 DIAGNOSIS — G9341 Metabolic encephalopathy: Secondary | ICD-10-CM | POA: Diagnosis not present

## 2021-08-17 DIAGNOSIS — C3490 Malignant neoplasm of unspecified part of unspecified bronchus or lung: Secondary | ICD-10-CM | POA: Diagnosis not present

## 2021-08-17 LAB — GLUCOSE, CAPILLARY
Glucose-Capillary: 319 mg/dL — ABNORMAL HIGH (ref 70–99)
Glucose-Capillary: 296 mg/dL — ABNORMAL HIGH (ref 70–99)
Glucose-Capillary: 298 mg/dL — ABNORMAL HIGH (ref 70–99)
Glucose-Capillary: 340 mg/dL — ABNORMAL HIGH (ref 70–99)

## 2021-08-17 LAB — BASIC METABOLIC PANEL
Anion gap: 12 (ref 5–15)
BUN: 19 mg/dL (ref 8–23)
CO2: 25 mmol/L (ref 22–32)
Calcium: 8.5 mg/dL — ABNORMAL LOW (ref 8.9–10.3)
Chloride: 93 mmol/L — ABNORMAL LOW (ref 98–111)
Creatinine, Ser: 0.54 mg/dL — ABNORMAL LOW (ref 0.61–1.24)
GFR, Estimated: >60 mL/min (ref >60)
Glucose, Bld: 321 mg/dL — ABNORMAL HIGH (ref 70–99)
Potassium: 4.3 mmol/L (ref 3.5–5.1)
Sodium: 130 mmol/L — ABNORMAL LOW (ref 135–145)

## 2021-08-17 LAB — CBC WITH DIFFERENTIAL/PLATELET
Abs Immature Granulocytes: 0.29 10*3/uL — ABNORMAL HIGH (ref 0.00–0.07)
Basophils Absolute: 0 10*3/uL (ref 0.0–0.1)
Basophils Relative: 0 %
Eosinophils Absolute: 0 10*3/uL (ref 0.0–0.5)
Eosinophils Relative: 0 %
HCT: 35 % — ABNORMAL LOW (ref 39.0–52.0)
Hemoglobin: 12.4 g/dL — ABNORMAL LOW (ref 13.0–17.0)
Immature Granulocytes: 2 %
Lymphocytes Relative: 6 %
Lymphs Abs: 0.7 10*3/uL (ref 0.7–4.0)
MCH: 34.3 pg — ABNORMAL HIGH (ref 26.0–34.0)
MCHC: 35.4 g/dL (ref 30.0–36.0)
MCV: 97 fL (ref 80.0–100.0)
Monocytes Absolute: 0.8 10*3/uL (ref 0.1–1.0)
Monocytes Relative: 7 %
Neutro Abs: 10.4 10*3/uL — ABNORMAL HIGH (ref 1.7–7.7)
Neutrophils Relative %: 85 %
Platelets: 153 10*3/uL (ref 150–400)
RBC: 3.61 MIL/uL — ABNORMAL LOW (ref 4.22–5.81)
RDW: 12.7 % (ref 11.5–15.5)
WBC: 12.3 10*3/uL — ABNORMAL HIGH (ref 4.0–10.5)
nRBC: 0 % (ref 0.0–0.2)

## 2021-08-17 MED ORDER — IPRATROPIUM-ALBUTEROL 0.5-2.5 (3) MG/3ML IN SOLN: 3.0000 mL | Freq: Four times a day (QID) | RESPIRATORY_TRACT | Status: DC | PRN

## 2021-08-17 MED ORDER — SALINE SPRAY 0.65 % NA SOLN
1.0000 | NASAL | Status: DC | PRN
  Administered 2021-08-17: 1 via NASAL
  Filled 2021-08-17: qty 44

## 2021-08-17 NOTE — Progress Notes (Signed)
?PROGRESS NOTE ? ? ? John Perez  ZLD:[id number] DOB: 30-Nov-1940 DOA: 08/11/2021 ?PCP: Derinda Late, MD  ? ? ?Brief Narrative:  ?81 y.o. male Patient is a 81 years old male with past medical history of diabetes, hyperlipidemia, hypertension, history of lung cancer who follows up with Dr. Grayland Ormond as outpatient, history of chronic hyponatremia presented to hospital with worsening confusion.  Patient has been more confused than usual and had passed out yesterday.  Patient sodium level was 119 and on repeat in the ED was 122.  Patient was then brought into the hospital for altered mental status.  Patient does have a history of lung cancer and was scheduled to undergo radiation treatment starting today but has presented to the hospital at this time.  Patient's daughter at bedside stated that patient was confused lethargic yesterday and had few falls.  He has been having impaired appetite for the last 2 to 3 days.  Normally was able to ambulate by himself but for the last 2 to 3 days has not been able to do much.  There is no mention of nausea vomiting or diarrhea.  No mention of fever chills or rigor.  No mention of increasing cough or shortness of breath/dyspnea.  Denies any urinary urgency frequency dysuria and but not been drinking much recently.  Patient does have history of chronic hyponatremia and was on salt tablets in the past but was not currently taking it as per the primary care physician. ?  ?In the ED, patient had stable vitals.  UA showed some glucose but no evidence of infection..  Initial BNP was 122.  Potassium was 3.5.  Creatinine of 0.4.  WBC was mildly elevated at 10.8.  Hemoglobin of 11.8.  Chest x-ray showed extensive interstitial and airspace opacities throughout the right lung significantly progressed from previous PET CT scan on 07/07/2021.  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  CT head scan showed progressive expansile and conspicuous hypodensity in the high left parietal lobe.  MRI of the was recommended.  Patient received dexamethasone 10 mg IV in the ED.  Oncology Dr. Tasia Catchings was notified from the ED and patient was consulted for admission to the hospital for further evaluation and treatment. ? ?5/3: MRI reviewed.  Oncology and neurology engaged.  MRI findings consistent with CVA.  No clear evidence of leptomeningeal spread or intracranial metastasis.  Mental status weakness improving.  Nephrology engaged for recommendations regarding hyponatremia treatment ? ?5/4: Patient appears more tremulous this morning, complaining of chills.  Serum sodium improving. ?5/5: Clinically appears improved however unfortunately sodium has not dropped to 122 from 126 yesterday.  Case discussed with nephrology.  We will attempt tolvaptan. ?5/6: More lethargic today.  Serum sodium improved to 129 after tolvaptan ?5/7: Sodium 131.  Patient remains somewhat lethargic.  Son at bedside. ?5/8: Na 130.  Lethargic/fatigued this morning. ? ? ?Assessment & Plan: ?  ?Principal Problem: ?  Acute metabolic encephalopathy ?Active Problems: ?  Hyponatremia ?  Diabetes mellitus type 2, insulin dependent ([place]) ?  Metastatic lung cancer (metastasis from lung to other site) [facility]) ?  Hypercholesteremia ?  Hypertension ?  Altered mental status ?  Malignant neoplasm of lung ([place]) ?  Brain lesion ? ?Acute metabolic encephalopathy ?Severe symptomatic hyponatremia ?Patient does have chronic  hyponatremia ?Sodium on presentation 119 ?Started on salt tablets 2 g twice daily ?Sodium initially improved to 126, now back down to 123 ?Status post tolvaptan 5/5 ?Sodium 129 as of 5/6 ?Sodium 131 as of 5/7 ?Sodium 130 5/8 ?Plan: ?Continue salt tabs 2 g twice daily ?No further  tolvaptan ?Can continue oxycodone ?Can continue Paxil ?Appreciate nephrology assistance ? ?Lung cancer with metastasis ?Patient's primary oncologist is Dr. Grayland Ormond ?Patient with extensive tumor burden ?Overall poor prognosis ?No clear radiographic evidence of cerebral metastasis ?Plan: ?Spoke to family at bedside.   ?We will follow-up outpatient with Dr. Grayland Ormond primary oncologist ?[place] discharge 5/9 ? ?Type 2 diabetes mellitus with hyperglycemia ?Sugars have been elevated in the setting of steroid use ?Discontinued steroids ?Plan: ?Semglee 5 units daily ?Moderate sliding scale ?Carb modified diet ?Avoid hypoglycemia ? ?Essential hypertension ?Blood pressure controlled ?Continue benazepril ?Hold Lasix ?IV hydralazine as needed ? ?Hyperlipidemia ?PTA pravastatin ? ?BPH ?PTA Flomax and finasteride ? ?Hyponatremia ?Resolved ?Monitor and replace as necessary ? ? ? ?DVT prophylaxis: SQ Lovenox ?Code Status: DNR ?Family Communication: Daughter at bedside 5/3, 5/4, wife and daughter at bedside 5/5, daughter and wife at bedside 5/6, son at bedside 5/7, daughter and wife at bedside 5/8 ?Disposition Plan: Status is: Inpatient ?Remains inpatient appropriate because: Hyponatremia in setting of metastatic lung CA. sodium levels improving.  Mentation and energy level poor.  Will monitor overnight.  Anticipate discharge 5/9 ? ?Level of care: Med-Surg ? ?Consultants:  ?Oncology ?Nephrology ?Palliative care ?Neurology ? ?Procedures:  ?None ? ?Antimicrobials: ?None ? ? ?Subjective: ?Seen\examined.  Very fatigued this morning ? ?Objective: ?Vitals:  ? 08/16/21 2108 08/16/21 2110 08/17/21 0530 08/17/21 0750  ?BP: (!) 132/94  (!) 141/86 (!) 152/92  ?Pulse: 95  83 65  ?Resp: 18   18  ?Temp: 98 ?F (36.7 ?C)  97.8 ?F (36.6 ?C) (!) 97.3 ?F (36.3 ?C)  ?TempSrc: Oral  Oral Oral  ?SpO2: 93% 97% 97% 92%  ?Weight:      ?Height:      ? ? ?Intake/Output Summary (Last 24 hours) at 08/17/2021 1337 ?Last data filed at 08/17/2021 1102 ?Gross per  24 hour  ?Intake 250 ml  ?Output --  ?Net 250 ml  ? ? ?Filed Weights  ? 08/11/21 1933 08/15/21 0500 08/16/21 0500  ?Weight: 54.9 kg 54.6 kg 53.4 kg  ? ? ?Examination: ? ?General exam: No acute distress.  Appears frail.  Fatigued ?Respiratory system: Lungs clear.  Normal work of breathing.  Room air ?Cardiovascular system: S1-S2, tachycardic, no murmurs, no pedal edema ?Gastrointestinal system: Soft, NT/ND, normal bowel sounds ?Central nervous system: Alert and oriented. No focal neurological deficits. ?Extremities: Symmetric 5 x 5 power. ?Skin: No rashes, lesions or ulcers ?Psychiatry: Judgement and insight appear normal. Mood & affect appropriate.  ? ? ? ?Data Reviewed: I have personally reviewed following labs and imaging studies ? ?CBC: ?Recent Labs  ?Lab 08/10/21 ?1433 08/11/21 ?2423 08/12/21 ?5361 08/15/21 ?1246 08/17/21 ?0820  ?WBC 13.4* 10.8* 9.0 12.4* 12.3*  ?NEUTROABS 11.2* 9.2*  --  10.7* 10.4*  ?HGB 12.2* 11.8* 10.9* 12.4* 12.4*  ?HCT 33.3* 33.4* 30.5* 35.1* 35.0*  ?MCV 94.6 97.4 95.0 95.9 97.0  ?PLT 239 221 196 222 153  ? ?Basic Metabolic Panel: ?Recent Labs  ?Lab 08/12/21 ?4431 08/13/21 ?0406 08/14/21 ?5400 08/14/21 ?1013 08/14/21 ?1751 08/15/21 ?0111 08/15/21 ?1246 08/16/21 ?0749 08/16/21 ?1349 08/17/21 ?0820  ?NA 123* 126* 123* 122* 123* 129* 130* 131*  --  130*  ?K 3.8 3.8 3.5 3.6  --   --  4.2 3.1*  --  4.3  ?CL 88* 92* 89* 90*  --   --  96* 95*  --  93*  ?CO2 22 23 23  21*  --   --  22 25  --  25  ?GLUCOSE 316* 186* 284* 321*  --   --  246* 209* 432* 321*  ?BUN 11 14 14 17   --   --  15 12  --  19  ?CREATININE 0.62 0.44* 0.42* 0.43*  --   --  0.47* 0.50*  --  0.54*  ?CALCIUM 8.2* 8.3* 8.3* 8.4*  --   --  8.6* 8.3*  --  8.5*  ?MG 1.6*  --   --   --   --   --   --   --   --   --   ?PHOS  --  2.9  --   --   --   --   --   --   --   --   ? ?GFR: ?Estimated Creatinine Clearance: 54.7 mL/min (A) (by C-G formula based on SCr of 0.54 mg/dL (L)). ?Liver Function Tests: ?Recent Labs  ?Lab 08/10/21 ?1433  08/11/21 ?1007 08/12/21 ?1219 08/13/21 ?0406 08/14/21 ?1013  ?AST 33 31 31  --  37  ?ALT 24 22 24   --  30  ?ALKPHOS 192* 167* 161*  --  239*  ?BILITOT 1.4* 1.4* 1.6*  --  0.9  ?PROT 6.3* 6.0* 5.9*  --  5.8*  ?AL

## 2021-08-17 NOTE — Progress Notes (Signed)
[facility]  ROUNDING NOTE   Subjective:   John Perez is a 81 year old male with past medical conditions including hyperlipidemia, diabetes, hypertension, lung cancer, and chronic hyponatremia.  Patient presents to the emergency department with increased weakness and confusion.  Patient has been admitted for Syncope and collapse [R55] Brain lesion [G93.9] Malignant neoplasm of lung, unspecified laterality, unspecified part of lung (HCC) [C34.90] Altered mental status, unspecified altered mental status type [R41.82] Acute metabolic encephalopathy [G93.41]  Patient is known to our practice from previous admissions for the same concern.    Patient seen today on first floor, patient resting comfortably in the bed Patient family present in the room. Patient offers no new specific physical complaints. I then discussed patient's hyponatremia related issues with the patient and his family  Objective:  Vital signs in last 24 hours:  Temp:  [97.3 F (36.3 C)-98.2 F (36.8 C)] 97.3 F (36.3 C) (05/08 0750) Pulse Rate:  [65-95] 65 (05/08 0750) Resp:  [18-19] 18 (05/08 0750) BP: (132-152)/(80-94) 152/92 (05/08 0750) SpO2:  [92 %-97 %] 92 % (05/08 0750)  Weight change:  Filed Weights   08/11/21 1933 08/15/21 0500 08/16/21 0500  Weight: 54.9 kg 54.6 kg 53.4 kg    Intake/Output: I/O last 3 completed shifts: In: 603 [P.O.:600; I.V.:3] Out: 1290 [Urine:1290]   Intake/Output this shift:  No intake/output data recorded.  Physical Exam: General: NAD,  Head: Moist oral mucosal membranes  Eyes: Anicteric  Lungs:  Clear to auscultation, normal effort, room air  Heart: Regular rate and rhythm  Abdomen:  Soft, nontender, nondistended  Extremities: No peripheral edema.  Neurologic: Resting quietly this am  Skin: No lesions    Basic Metabolic Panel: Recent Labs  Lab 08/12/21 0537 08/13/21 0406 08/14/21 0748 08/14/21 1013 08/14/21 1751 08/15/21 0111 08/15/21 1246  08/16/21 0749 08/16/21 1349 08/17/21 0820  NA 123* 126* 123* 122* 123* 129* 130* 131*  --  130*  K 3.8 3.8 3.5 3.6  --   --  4.2 3.1*  --  4.3  CL 88* 92* 89* 90*  --   --  96* 95*  --  93*  CO2 22 23 23  21*  --   --  22 25  --  25  GLUCOSE 316* 186* 284* 321*  --   --  246* 209* 432* 321*  BUN 11 14 14 17   --   --  15 12  --  19  CREATININE 0.62 0.44* 0.42* 0.43*  --   --  0.47* 0.50*  --  0.54*  CALCIUM 8.2* 8.3* 8.3* 8.4*  --   --  8.6* 8.3*  --  8.5*  MG 1.6*  --   --   --   --   --   --   --   --   --   PHOS  --  2.9  --   --   --   --   --   --   --   --     Liver Function Tests: Recent Labs  Lab 08/10/21 1433 08/11/21 0904 08/12/21 0537 08/13/21 0406 08/14/21 1013  AST 33 31 31  --  37  ALT 24 22 24   --  30  ALKPHOS 192* 167* 161*  --  239*  BILITOT 1.4* 1.4* 1.6*  --  0.9  PROT 6.3* 6.0* 5.9*  --  5.8*  ALBUMIN 3.0* 2.9* 3.0* 2.8* 3.0*   No results for input(s): LIPASE, AMYLASE in the last 168 hours.  No results for input(s): AMMONIA in the last 168 hours.  CBC: Recent Labs  Lab 08/10/21 1433 08/11/21 0904 08/12/21 0537 08/15/21 1246 08/17/21 0820  WBC 13.4* 10.8* 9.0 12.4* 12.3*  NEUTROABS 11.2* 9.2*  --  10.7* 10.4*  HGB 12.2* 11.8* 10.9* 12.4* 12.4*  HCT 33.3* 33.4* 30.5* 35.1* 35.0*  MCV 94.6 97.4 95.0 95.9 97.0  PLT 239 221 196 222 153    Cardiac Enzymes: No results for input(s): CKTOTAL, CKMB, CKMBINDEX, TROPONINI in the last 168 hours.  BNP: Invalid input(s): POCBNP  CBG: Recent Labs  Lab 08/16/21 0816 08/16/21 1300 08/16/21 1700 08/16/21 2057 08/17/21 0751  GLUCAP 230* 404* 229* 75 319*    Microbiology: Results for orders placed or performed during the hospital encounter of 06/10/21  Urine Culture     Status: Abnormal   Collection Time: 06/10/21  3:42 PM   Specimen: Urine, Random  Result Value Ref Range Status   Specimen Description   Final    URINE, RANDOM Performed at [facility], [address] [place],  [place] [address]    Special Requests   Final    Normal Performed at [facility], [address] [place], [place] [address]    Culture >=100,000 COLONIES/mL SERRATIA MARCESCENS (A)  Final   Report Status 06/13/2021 FINAL  Final   Organism ID, Bacteria SERRATIA MARCESCENS (A)  Final      Susceptibility   Serratia marcescens - MIC*    CEFAZOLIN >=64 RESISTANT Resistant     CEFEPIME <=0.12 SENSITIVE Sensitive     CEFTRIAXONE <=0.25 SENSITIVE Sensitive     CIPROFLOXACIN <=0.25 SENSITIVE Sensitive     GENTAMICIN <=1 SENSITIVE Sensitive     NITROFURANTOIN 256 RESISTANT Resistant     TRIMETH/SULFA <=20 SENSITIVE Sensitive     * >=100,000 COLONIES/mL SERRATIA MARCESCENS  Resp Panel by RT-PCR (Flu A&B, Covid) Nasopharyngeal Swab     Status: None   Collection Time: 06/10/21  4:41 PM   Specimen: Nasopharyngeal Swab; Nasopharyngeal(NP) swabs in vial transport medium  Result Value Ref Range Status   SARS Coronavirus 2 by RT PCR NEGATIVE NEGATIVE Final    Comment: (NOTE) SARS-CoV-2 target nucleic acids are NOT DETECTED.  The SARS-CoV-2 RNA is generally detectable in upper respiratory specimens during the acute phase of infection. The lowest concentration of SARS-CoV-2 viral copies this assay can detect is 138 copies/mL. A negative result does not preclude SARS-Cov-2 infection and should not be used as the sole basis for treatment or other patient management decisions. A negative result may occur with  improper specimen collection/handling, submission of specimen other than nasopharyngeal swab, presence of viral mutation(s) within the areas targeted by this assay, and inadequate number of viral copies(<138 copies/mL). A negative result must be combined with clinical observations, patient history, and epidemiological information. The expected result is Negative.  Fact Sheet for Patients:  BloggerCourse.com  Fact Sheet for Healthcare Providers:   SeriousBroker.it  This test is no t yet approved or cleared by the [place] FDA and  has been authorized for detection and/or diagnosis of SARS-CoV-2 by FDA under an Emergency Use Authorization (EUA). This EUA will remain  in effect (meaning this test can be used) for the duration of the COVID-19 declaration under Section 564(b)(1) of the Act, 21 U.S.C.section 360bbb-3(b)(1), unless the authorization is terminated  or revoked sooner.       Influenza A by PCR NEGATIVE NEGATIVE Final   Influenza B by PCR NEGATIVE NEGATIVE Final  Comment: (NOTE) The Xpert Xpress SARS-CoV-2/FLU/RSV plus assay is intended as an aid in the diagnosis of influenza from Nasopharyngeal swab specimens and should not be used as a sole basis for treatment. Nasal washings and aspirates are unacceptable for Xpert Xpress SARS-CoV-2/FLU/RSV testing.  Fact Sheet for Patients: BloggerCourse.com  Fact Sheet for Healthcare Providers: SeriousBroker.it  This test is not yet approved or cleared by the [place] FDA and has been authorized for detection and/or diagnosis of SARS-CoV-2 by FDA under an Emergency Use Authorization (EUA). This EUA will remain in effect (meaning this test can be used) for the duration of the COVID-19 declaration under Section 564(b)(1) of the Act, 21 U.S.C. section 360bbb-3(b)(1), unless the authorization is terminated or revoked.  Performed at [facility], [address] [place], [place] [address]     Coagulation Studies: No results for input(s): LABPROT, INR in the last 72 hours.  Urinalysis: No results for input(s): COLORURINE, LABSPEC, PHURINE, GLUCOSEU, HGBUR, BILIRUBINUR, KETONESUR, PROTEINUR, UROBILINOGEN, NITRITE, LEUKOCYTESUR in the last 72 hours.  Invalid input(s): APPERANCEUR     Imaging: CT HEAD WO CONTRAST ( )  Result Date: 08/15/2021 CLINICAL DATA:  Mental status  change. EXAM: CT HEAD WITHOUT CONTRAST TECHNIQUE: Contiguous axial images were obtained from the base of the skull through the vertex without intravenous contrast. RADIATION DOSE REDUCTION: This exam was performed according to the departmental dose-optimization program which includes automated exposure control, adjustment of the mA and/or kV according to patient size and/or use of iterative reconstruction technique. COMPARISON:  Multiple previous MRI brain and head CTs. FINDINGS: Brain: Ventricles and cisterns are normal. Stable region of low-attenuation over the left parotid and occipital white matter with low attenuation of the left occipital gray and subcortical white matter unchanged from recent exams and thought to represent subacute infarction. No evidence of focal mass or acute hemorrhage. No significant mass effect or shift of midline structures. Remainder of the exam is unchanged. Vascular: No hyperdense vessel or unexpected calcification. Skull: Normal. Negative for fracture or focal lesion. Sinuses/Orbits: No acute finding. Other: None. IMPRESSION: No acute findings. Stable left posterior parietal/occipital subacute infarction. Electronically Signed   By: Elberta Fortis M.D.   On: 08/15/2021 15:54     Medications:    sodium chloride      amLODipine  5 mg Oral Daily   aspirin EC  81 mg Oral Daily   benazepril  20 mg Oral Daily   clopidogrel  75 mg Oral Daily   docusate sodium  100 mg Oral BID   feeding supplement  237 mL Oral TID BM   finasteride  5 mg Oral Daily   gabapentin  300 mg Oral QHS   guaiFENesin  600 mg Oral BID   insulin aspart  0-15 Units Subcutaneous TID WC   insulin aspart  0-5 Units Subcutaneous QHS   insulin glargine-yfgn  5 Units Subcutaneous Daily   ipratropium-albuterol  3 mL Nebulization BID   magnesium oxide  400 mg Oral Daily   mirtazapine  15 mg Oral QHS   multivitamin with minerals  1 tablet Oral Daily   PARoxetine  10 mg Oral Daily   pravastatin  20 mg Oral  Daily   sodium chloride flush  3 mL Intravenous Q12H   sodium chloride flush  3 mL Intravenous Q12H   sodium chloride  2 g Oral BID WC   tamsulosin  0.4 mg Oral QPC supper   sodium chloride, acetaminophen **OR** acetaminophen, albuterol, ALPRAZolam, hydrALAZINE, melatonin, ondansetron, oxyCODONE, sodium chloride,  sodium chloride flush  Assessment/ Plan:  Mr. Kordel Arriola is a 81 y.o.  male past medical conditions including hyperlipidemia, diabetes, hypertension, lung cancer, and chronic hyponatremia.  Patient presents to the emergency department with increased weakness and confusion.  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 History of lung cancer. Patient recently taken off salts tabs outpatient and liberalized fluid restriction. Admission sodium 119. continue sodium tabs 2g BID.  Sodium is 130 this morning.  Continue sodium chloride supplementation Encourage oral nutrition  2. Hypertension.   -Patient blood pressure is at goal for age of 81 years old  -Current regimen includes amlodipine, benazepril  3. Hypokalemia  Likely due to poor nutrition  May replace with oral supplements as tolerated            Now better  4. Diabetes mellitus type II, unspecified: noninsulin dependent.  Most recent hemoglobin A1c is 6.6 on 06/10/21.  Metformin held   Glucose elevated. Primary team to manage SSI   LOS: 6 Lathan Gieselman s [facility] 5/8/202310:24 AM

## 2021-08-17 NOTE — Progress Notes (Addendum)
Inpatient Diabetes Program Recommendations ? ?AACE/ADA: New Consensus Statement on Inpatient Glycemic Control (2015) ? ?Target Ranges:  Prepandial:   less than 140 mg/dL ?     Peak postprandial:   less than 180 mg/dL (1-2 hours) ?     Critically ill patients:  140 - 180 mg/dL  ? ? Latest Reference Range & Units 08/17/21 07:51 08/17/21 11:40  ?Glucose-Capillary 70 - 99 mg/dL 319 (H) ? ?11 units Novolog ? 296 (H) ? ?8 units Novolog ? ?5 units Semglee ?  ?(H): Data is abnormally high ? ? ? ?Home DM Meds: Metformin 1000 mg BID  ?   Novolog SSI TID (see PCP notes 07/28/2021) ? ?Current Orders: Novolog 0-15 units TID ac/hs  ?     Semglee 5 units Daily ? ? ? ?MD--Note Pt eating 75% of meals and getting PO Supps (Ensure Enlive) 237 ml TID between meals. ? ?CBGs remain above goal ? ?Please consider: ? ?1. Increase Semglee to 10 units Daily (0.2 units/kg) ? ?2. Start Novolog Meal Coverage: Novolog 3 units TID with meals ?HOLD of pt eats <50% meal ? ? ? ?--Will follow patient during hospitalization-- ? ?Wyn Quaker RN, MSN, CDE ?Diabetes Coordinator ?Inpatient Glycemic Control Team ?Team Pager: [phone] (8a-5p) ? ?

## 2021-08-17 NOTE — Progress Notes (Signed)
Physical Therapy Re-Evaluation ?Patient Details ?Name: John Perez ?MRN: [id number] ?DOB: 03-09-1941 ?Today's Date: 08/17/2021 ? ? ?History of Present Illness Pt is admitted for acute metabolic encephalopathy with acute/subacute L occipital and parietal lobe infarct. Of note, also with + lung mass and recent falls. Other history includes DM, HLD, and HTN. ? ?  ?PT Comments  ? ? Pt received supine in bed agreeable to PT services. Due to concerns of further weakness, Attending MD requesting Re-Eval. Pt requires supervision, increased time, and HOB elevated to reach sitting Eob with ability to independently don socks prior to transfer in long sitting. Noted increased WOB with SPO2 at 88-89% that improves with education on PLB. Quickly returns to 92%. Pt requiring minA to stand to RW with max VC's for hand placement with good carryover with subtle posterior lean once standing. Min multimodal cuing provided at pelvis on upright positioning to correct posterior lean which improves after cuing. Pt able to ambulate ~80' total with RW with minA and VC's on RW for sequencing with turns but maintains RW closer to BOS compared to prior sessions. Pt limited to 39' as pt requesting need to void B/B. Pt able to urinate standing at toilet with supervision and SUE on rail then able to turn with RW and sit on elevated toilet for continent BM. Pt does present with increased weakness and increased WOB compared to documented prior sessions but pt maintains safe level of ambulation with AD to remain safe to d/c home if family remains able to provide 24/7 supervision/assist. Pt left sitting on commode in care of OT. Spouse updated on d/c recs remaining appropriate and use of RW to reduce risk of falls. Goals remain appropriate as well. ?  ?Recommendations for follow up therapy are one component of a multi-disciplinary discharge planning process, led by the attending physician.  Recommendations may be updated based on patient status,  additional functional criteria and insurance authorization. ? ?Follow Up Recommendations ? Home health PT ?  ?  ?Assistance Recommended at Discharge Frequent or constant Supervision/Assistance  ?Patient can return home with the following A little help with walking and/or transfers;A little help with bathing/dressing/bathroom;Assist for transportation;Help with stairs or ramp for entrance ?  ?Equipment Recommendations ? None recommended by PT  ?  ?Recommendations for Other Services   ? ? ?  ?Precautions / Restrictions Precautions ?Precautions: Fall ?Restrictions ?Weight Bearing Restrictions: No  ?  ? ?Mobility ? Bed Mobility ?Overal bed mobility: Needs Assistance ?Bed Mobility: Supine to Sit ?  ?  ?Supine to sit: HOB elevated, Supervision ?  ?  ?General bed mobility comments: increased time ?Patient Response: Cooperative, Flat affect ? ?Transfers ?Overall transfer level: Needs assistance ?Equipment used: Rolling walker (2 wheels) ?Transfers: Sit to/from Stand ?Sit to Stand: Min assist ?  ?  ?  ?  ?  ?General transfer comment: Relied on minA from EOb with bed at lowest position. Slight posterior lean noted that improves with TC's ?  ? ?Ambulation/Gait ?Ambulation/Gait assistance: Supervision ?Gait Distance (Feet): 80 Feet ?Assistive device: Rolling walker (2 wheels) ?Gait Pattern/deviations: Step-through pattern, Trunk flexed ?  ?  ?  ?General Gait Details: Step through gait with trunk flexed. Maintains RW closer to BOS ? ? ?Stairs ?  ?  ?  ?  ?  ? ? ?Wheelchair Mobility ?  ? ?Modified Rankin (Stroke Patients Only) ?  ? ? ?  ?Balance Overall balance assessment: History of Falls, Needs assistance ?Sitting-balance support: Feet supported ?Sitting balance-Leahy Scale: Good ?  ?  ?  Standing balance support: Bilateral upper extremity supported, Reliant on assistive device for balance, During functional activity ?Standing balance-Leahy Scale: Fair ?  ?  ?  ?  ?  ?  ?  ?  ?  ?  ?  ?  ?  ? ?  ?Cognition Arousal/Alertness:  Lethargic ?Behavior During Therapy: Flat affect ?  ?  ?  ?  ?  ?  ?  ?  ?  ?  ?  ?  ?  ?Following Commands: Follows one step commands with increased time ?Safety/Judgement: Decreased awareness of safety, Decreased awareness of deficits ?  ?Problem Solving: Slow processing, Decreased initiation, Requires verbal cues, Requires tactile cues ?  ?  ?  ? ?  ?Exercises Other Exercises ?Other Exercises: current d/c recs remain appropriate to pt and spouse ? ?  ?General Comments General comments (skin integrity, edema, etc.): SPo2 at 88-89% prior to session. IMproves to 90-92% with progression of OOB mobility. ?  ?  ? ?Pertinent Vitals/Pain Pain Assessment ?Pain Assessment: No/denies pain  ? ? ?Home Living   ?  ?  ?  ?  ?  ?  ?  ?  ?  ?   ?  ?Prior Function    ?  ?  ?   ? ?PT Goals (current goals can now be found in the care plan section) Acute Rehab PT Goals ?Time For Goal Achievement: 13-Sep-2021 ?Potential to Achieve Goals: Fair ?Progress towards PT goals: Progressing toward goals ? ?  ?Frequency ? ? ? Min 2X/week ? ? ? ?  ?PT Plan Current plan remains appropriate  ? ? ?Co-evaluation   ?  ?  ?  ?  ? ?  ?AM-PAC PT "6 Clicks" Mobility   ?Outcome Measure ? Help needed turning from your back to your side while in a flat bed without using bedrails?: A Little ?Help needed moving from lying on your back to sitting on the side of a flat bed without using bedrails?: A Little ?Help needed moving to and from a bed to a chair (including a wheelchair)?: A Little ?Help needed standing up from a chair using your arms (e.g., wheelchair or bedside chair)?: A Little ?Help needed to walk in hospital room?: A Little ?Help needed climbing 3-5 steps with a railing? : A Little ?6 Click Score: 18 ? ?  ?End of Session Equipment Utilized During Treatment: Gait belt ?Activity Tolerance: Patient tolerated treatment well ?Patient left:  (in bathroom on toilet in care of OT) ?Nurse Communication: Mobility status ?PT Visit Diagnosis: Unsteadiness on feet  (R26.81);Muscle weakness (generalized) (M62.81);History of falling (Z91.81);Difficulty in walking, not elsewhere classified (R26.2) ?  ? ? ?Time: 8366-2947 ?PT Time Calculation (min) (ACUTE ONLY): 22 min ? ?Charges:             ?          ?Salem Caster. Fairly IV, PT, DPT ?Physical Therapist- [place]  ?[facility]  ?08/17/2021, 11:57 AM ? ?

## 2021-08-17 NOTE — Progress Notes (Signed)
Occupational Therapy Treatment ?Patient Details ?Name: John Perez ?MRN: [id number] ?DOB: 1940-04-25 ?Today's Date: 08/17/2021 ? ? ?History of present illness Pt is admitted for acute metabolic encephalopathy with acute/subacute L occipital and parietal lobe infarct. Of note, also with + lung mass and recent falls. Other history includes DM, HLD, and HTN. ?  ?OT comments ? Mr. Tsou was seen for OT treatment on this date. Upon arrival to room pt awake/alert, seated on commode in room bathroom with PT present finishing up session. OT facilitated ADL management as described below. Per pt spouse, pt is moving and has improved cognition from over the weekend. Pt requires SUPERVISION for transfer from commode to room chair, SET UP assist for partial shave while seated in room recliner. MIN A for thoroughness for face washing after shaving. Spouse eager to provide assist t/o session. Pt making good progress toward goals. Pt continues to benefit from skilled OT services to maximize return to PLOF and minimize risk of future falls, injury, caregiver burden, and readmission. Will continue to follow POC. Discharge recommendation remains appropriate.  ?  ? ?Recommendations for follow up therapy are one component of a multi-disciplinary discharge planning process, led by the attending physician.  Recommendations may be updated based on patient status, additional functional criteria and insurance authorization. ?   ?Follow Up Recommendations ? Home health OT  ?  ?Assistance Recommended at Discharge Frequent or constant Supervision/Assistance  ?Patient can return home with the following ? A little help with walking and/or transfers;A little help with bathing/dressing/bathroom;Assistance with feeding;Assist for transportation;Direct supervision/assist for financial management;Assistance with cooking/housework;Direct supervision/assist for medications management;Help with stairs or ramp for entrance ?  ?Equipment Recommendations ? None  recommended by OT;Other (comment)  ?  ?Recommendations for Other Services   ? ?  ?Precautions / Restrictions Precautions ?Precautions: Fall ?Restrictions ?Weight Bearing Restrictions: No  ? ? ?  ? ?Mobility Bed Mobility ?Overal bed mobility: Needs Assistance ?  ?  ?  ?  ?  ?  ?General bed mobility comments: deferred. Pt seated at start/end of session. ?  ? ?Transfers ?Overall transfer level: Needs assistance ?Equipment used: Rolling walker (2 wheels) ?Transfers: Sit to/from Stand ?Sit to Stand: Min assist ?  ?  ?  ?  ?  ?General transfer comment: Min A to STS from surface without arm rails. CGA for STS from [facility] with arm rests. ?  ?  ?Balance Overall balance assessment: History of Falls, Needs assistance ?Sitting-balance support: Feet supported, Single extremity supported ?Sitting balance-Leahy Scale: Good ?Sitting balance - Comments: fatigues quickly in unsupported sitting position which limits balance. ?  ?Standing balance support: Bilateral upper extremity supported, Reliant on assistive device for balance, During functional activity ?Standing balance-Leahy Scale: Fair ?  ?  ?  ?  ?  ?  ?  ?  ?  ?  ?  ?  ?   ? ?ADL either performed or assessed with clinical judgement  ? ?ADL Overall ADL's : Needs assistance/impaired ?  ?  ?  ?  ?  ?  ?  ?  ?  ?  ?  ?  ?  ?  ?  ?  ?  ?  ?  ?General ADL Comments: Pt requires SUPERVISION for transfer from commode to room chair, SET UP assist for partial shave while seated in room recliner. MIN A for thoroughness for face washing after shaving. Spouse eager to provide assist t/o session. ?  ? ?Extremity/Trunk Assessment Upper Extremity Assessment ?Upper Extremity Assessment: Generalized  weakness ?  ?Lower Extremity Assessment ?Lower Extremity Assessment: Generalized weakness ?  ?Cervical / Trunk Assessment ?Cervical / Trunk Assessment: Kyphotic ?  ? ?Vision Patient Visual Report: No change from baseline ?  ?  ?Perception   ?  ?Praxis   ?  ? ?Cognition Arousal/Alertness:  Lethargic ?Behavior During Therapy: Flat affect ?Overall Cognitive Status: Within Functional Limits for tasks assessed ?  ?  ?  ?  ?  ?  ?  ?  ?  ?  ?  ?  ?  ?  ?  ?  ?General Comments: Pt able to follow 1 step VCs consistently during session, generally lethargic/fatigues quickly with activity. Per wife, mental status is much improved from previous date. ?  ?  ?   ?Exercises Other Exercises ?Other Exercises: OT facilitated toilet transfer, seated UB grooming, and education on DC recs/energy conservation strategies t/o session. ? ?  ?Shoulder Instructions   ? ? ?  ?General Comments SPo2 noted to reach 89% with activity. Improves to 90-92% with therapeutic rest break and cues for PLB during session.  ? ? ?Pertinent Vitals/ Pain       Pain Assessment ?Pain Assessment: No/denies pain ? ?Home Living Family/patient expects to be discharged to:: Private residence ?Living Arrangements: Spouse/significant other ?Available Help at Discharge: Family;Friend(s);Available 24 hours/day ?  ?  ?  ?  ?  ?  ?  ?  ?  ?  ?  ?  ?  ?  ?  ?  ? ?  ?Prior Functioning/Environment    ?  ?  ?  ?   ? ?Frequency ? Min 2X/week  ? ? ? ? ?  ?Progress Toward Goals ? ?OT Goals(current goals can now be found in the care plan section) ? Progress towards OT goals: Progressing toward goals ? ?Acute Rehab OT Goals ?Patient Stated Goal: to go home ?OT Goal Formulation: With patient/family ?Time For Goal Achievement: 08/28/21 ?Potential to Achieve Goals: Good  ?Plan Discharge plan remains appropriate   ? ?Co-evaluation ? ? ?   ?  ?  ?  ?  ? ?  ?AM-PAC OT "6 Clicks" Daily Activity     ?Outcome Measure ? ? Help from another person eating meals?: None ?Help from another person taking care of personal grooming?: A Little ?Help from another person toileting, which includes using toliet, bedpan, or urinal?: A Little ?Help from another person bathing (including washing, rinsing, drying)?: A Lot ?Help from another person to put on and taking off regular upper body  clothing?: A Little ?Help from another person to put on and taking off regular lower body clothing?: A Little ?6 Click Score: 18 ? ?  ?End of Session Equipment Utilized During Treatment: Gait belt;Rolling walker (2 wheels) ? ?OT Visit Diagnosis: Muscle weakness (generalized) (M62.81);Repeated falls (R29.6) ?  ?Activity Tolerance Patient tolerated treatment well ?  ?Patient Left in chair;with call bell/phone within reach;with family/visitor present;with chair alarm set ?  ?Nurse Communication   ?  ? ?   ? ?Time: 2426-8341 ?OT Time Calculation (min): 24 min ? ?Charges: OT General Charges ?$OT Visit: 1 Visit ?OT Treatments ?$Self Care/Home Management : 23-37 mins ? ?Shara Blazing, M.S., OTR/L ?Ascom: [phone] ?08/17/21, 12:43 PM ? ?

## 2021-08-18 ENCOUNTER — Ambulatory Visit: Payer: Medicare HMO

## 2021-08-18 ENCOUNTER — Inpatient Hospital Stay: Payer: Medicare HMO

## 2021-08-18 DIAGNOSIS — G9341 Metabolic encephalopathy: Secondary | ICD-10-CM | POA: Diagnosis not present

## 2021-08-18 LAB — BASIC METABOLIC PANEL
Anion gap: 10 (ref 5–15)
BUN: 21 mg/dL (ref 8–23)
CO2: 24 mmol/L (ref 22–32)
Calcium: 8.5 mg/dL — ABNORMAL LOW (ref 8.9–10.3)
Chloride: 96 mmol/L — ABNORMAL LOW (ref 98–111)
Creatinine, Ser: 0.67 mg/dL (ref 0.61–1.24)
GFR, Estimated: >60 mL/min (ref >60)
Glucose, Bld: 314 mg/dL — ABNORMAL HIGH (ref 70–99)
Potassium: 3.9 mmol/L (ref 3.5–5.1)
Sodium: 130 mmol/L — ABNORMAL LOW (ref 135–145)

## 2021-08-18 LAB — GLUCOSE, CAPILLARY: Glucose-Capillary: 275 mg/dL — ABNORMAL HIGH (ref 70–99)

## 2021-08-18 MED ORDER — GUAIFENESIN ER 600 MG PO TB12: 600.0000 mg | ORAL_TABLET | Freq: Two times a day (BID) | ORAL | Status: AC

## 2021-08-18 MED ORDER — CLOPIDOGREL BISULFATE 75 MG PO TABS
75.0000 mg | ORAL_TABLET | Freq: Every day | ORAL | 0 refills | Status: AC
Start: 2021-08-19 — End: 2021-09-18

## 2021-08-18 MED ORDER — INSULIN GLARGINE-YFGN 100 UNIT/ML ~~LOC~~ SOLN
8.0000 [IU] | Freq: Every day | SUBCUTANEOUS | Status: DC
  Administered 2021-08-18: 8 [IU] via SUBCUTANEOUS
  Filled 2021-08-18: qty 0.08

## 2021-08-18 MED ORDER — SODIUM CHLORIDE 1 G PO TABS: 2.0000 g | ORAL_TABLET | Freq: Two times a day (BID) | ORAL | 0 refills | Status: AC

## 2021-08-18 NOTE — Plan of Care (Signed)
?  Problem: Education: ?Goal: Knowledge of disease or condition will improve ?Outcome: Progressing ?Goal: Knowledge of secondary prevention will improve (SELECT ALL) ?Outcome: Progressing ?Goal: Knowledge of patient specific risk factors will improve (INDIVIDUALIZE FOR PATIENT) ?Outcome: Progressing ?  ?

## 2021-08-18 NOTE — Progress Notes (Signed)
Mobility Specialist - Progress Note ? ? ? 08/18/21 1053  ?Mobility  ?Activity Refused mobility  ? ? ?Pt preparing for discharge upon time of arrival. ? ?Merrily Brittle ?Mobility Specialist ?08/18/21, 10:53 AM ? ? ? ?

## 2021-08-18 NOTE — Discharge Summary (Signed)
Physician Discharge Summary  ?John Perez DPO:[id number] DOB: Sep 25, 1940 DOA: 08/11/2021 ? ?PCP: Derinda Late, MD ? ?Admit date: 08/11/2021 ?Discharge date: 08/18/2021 ? ?Admitted From: Home ?Disposition:  Home with home health ? ?Recommendations for Outpatient Follow-up:  ?Follow up with PCP in 1-2 weeks ?Follow up with oncology as directed ? ?Home Health:Yes PT OT RN aide  ?Equipment/Devices:None  ? ?Discharge Condition: Stable ?CODE STATUS: DNR ?Diet recommendation: Carb modified ? ?Brief/Interim Summary: ?81 y.o. male Patient is a 81 years old male with past medical history of diabetes, hyperlipidemia, hypertension, history of lung cancer who follows up with Dr. Grayland Ormond as outpatient, history of chronic hyponatremia presented to hospital with worsening confusion.  Patient has been more confused than usual and had passed out yesterday.  Patient sodium level was 119 and on repeat in the ED was 122.  Patient was then brought into the hospital for altered mental status.  Patient does have a history of lung cancer and was scheduled to undergo radiation treatment starting today but has presented to the hospital at this time.  Patient's daughter at bedside stated that patient was confused lethargic yesterday and had few falls.  He has been having impaired appetite for the last 2 to 3 days.  Normally was able to ambulate by himself but for the last 2 to 3 days has not been able to do much.  There is no mention of nausea vomiting or diarrhea.  No mention of fever chills or rigor.  No mention of increasing cough or shortness of breath/dyspnea.  Denies any urinary urgency frequency dysuria and but not been drinking much recently.  Patient does have history of chronic hyponatremia and was on salt tablets in the past but was not currently taking it as per the primary care physician. ?  ?In the ED, patient had stable vitals.  UA showed some glucose but no evidence of infection..  Initial BNP was 122.  Potassium was 3.5.   Creatinine of 0.4.  WBC was mildly elevated at 10.8.  Hemoglobin of 11.8.  Chest x-ray showed extensive interstitial and airspace opacities throughout the right lung significantly progressed from previous PET CT scan on 07/07/2021.  CT chest showed interval increase in the right perihilar mass with small pleural effusion groundglass opacities suspicious for lymphocytic carcinomatosis with osseous metastasis in the manubrium and numerous compression fractures on T1 T4-T5 T7 L1 and L2 with metastatic hilar supraclavicular and axillary lymphadenopathy.  CT head scan showed progressive expansile and conspicuous hypodensity in the high left parietal lobe.  MRI of the was recommended.  Patient received dexamethasone 10 mg IV in the ED.  Oncology Dr. Tasia Catchings was notified from the ED and patient was consulted for admission to the hospital for further evaluation and treatment. ?  ?5/3: MRI reviewed.  Oncology and neurology engaged.  MRI findings consistent with CVA.  No clear evidence of leptomeningeal spread or intracranial metastasis.  Mental status weakness improving.  Nephrology engaged for recommendations regarding hyponatremia treatment ?  ?5/4: Patient appears more tremulous this morning, complaining of chills.  Serum sodium improving. ?5/5: Clinically appears improved however unfortunately sodium has not dropped to 122 from 126 yesterday.  Case discussed with nephrology.  We will attempt tolvaptan. ?5/6: More lethargic today.  Serum sodium improved to 129 after tolvaptan ?5/7: Sodium 131.  Patient remains somewhat lethargic.  Son at bedside. ?5/8: Na 130.  Lethargic/fatigued this morning. ?5/9: Sodium remains 130.  Energy level slightly improved this morning.  Wife at bedside.  Stable for  discharge home.  We will follow-up outpatient oncology on Thursday 5/11 ? ? ? ?Discharge Diagnoses:  ?Principal Problem: ?  Acute metabolic encephalopathy ?Active Problems: ?  Hyponatremia ?  Diabetes mellitus type 2, insulin dependent  ([place]) ?  Metastatic lung cancer (metastasis from lung to other site) [facility]) ?  Hypercholesteremia ?  Hypertension ?  Altered mental status ?  Malignant neoplasm of lung ([place]) ?  Brain lesion ? ?Acute metabolic encephalopathy ?Severe symptomatic hyponatremia ?Patient does have chronic hyponatremia ?Sodium on presentation 119 ?Started on salt tablets 2 g twice daily ?Sodium initially improved to 126, now back down to 123 ?Status post tolvaptan 5/5 ?Sodium 129 as of 5/6 ?Sodium 131 as of 5/7 ?Sodium 130 5/9 ?Plan: ?Mental status at baseline.  Continue salt tablets 2 g twice daily.  No further tolvaptan.  Can continue remainder of home medications.  Appreciate nephrology assistance in management.  Discharge home with home health services.  Follow-up outpatient oncology. ?  ?Lung cancer with metastasis ?Patient's primary oncologist is Dr. Grayland Ormond ?Patient with extensive tumor burden ?Overall poor prognosis ?No clear radiographic evidence of cerebral metastasis ?Plan: ?Discharge home with outpatient follow-up scheduled with oncology 5/11 ?  ?Type 2 diabetes mellitus with hyperglycemia ?Sugars have been elevated in the setting of steroid use ?Discontinued steroids ?Sugars remain labile ?Plan: ?Recommend outpatient PCP follow-up for trending of glucose levels and further management decisions regarding diabetes regimen.  For now we will resume home regimen ? ?Discharge Instructions ? ?Discharge Instructions   ? ? Diet - low sodium heart healthy   Complete by: As directed ?  ? Increase activity slowly   Complete by: As directed ?  ? No wound care   Complete by: As directed ?  ? ?  ? ?Allergies as of 08/18/2021   ?No Known Allergies ?  ? ?  ?Medication List  ?  ? ?STOP taking these medications   ? ?benazepril 20 MG tablet ?Commonly known as: LOTENSIN ?  ?FISH OIL PO ?  ?furosemide 20 MG tablet ?Commonly known as: LASIX ?  ?LORazepam 0.5 MG tablet ?Commonly known as: ATIVAN ?  ?magnesium oxide 400 (240 Mg) MG tablet ?Commonly  known as: MAG-OX ?  ?magnesium oxide 400 MG tablet ?Commonly known as: MAG-OX ?  ?Multi-Vitamin tablet ?  ?MULTIVITAMIN PO ?  ? ?  ? ?TAKE these medications   ? ?ASPIRIN 81 PO ?Take by mouth daily. ?  ?clopidogrel 75 MG tablet ?Commonly known as: PLAVIX ?Take 1 tablet (75 mg total) by mouth daily. ?Start taking on: Aug 19, 2021 ?  ?finasteride 5 MG tablet ?Commonly known as: PROSCAR ?Take 5 mg by mouth daily. ?  ?gabapentin 300 MG capsule ?Commonly known as: NEURONTIN ?Take 1 capsule by mouth at bedtime. ?  ?guaiFENesin 600 MG 12 hr tablet ?Commonly known as: [place] ?Take 1 tablet (600 mg total) by mouth 2 (two) times daily. ?  ?MELATONIN PO ?Take 5 mg by mouth at bedtime as needed. ?  ?metFORMIN 1000 MG tablet ?Commonly known as: GLUCOPHAGE ?Take 1 tablet (1,000 mg total) by mouth 2 (two) times daily with a meal. ?  ?mirtazapine 15 MG tablet ?Commonly known as: REMERON ?Take 15 mg by mouth at bedtime. ?  ?ondansetron 8 MG tablet ?Commonly known as: Zofran ?Take 1 tablet (8 mg total) by mouth 2 (two) times daily as needed for refractory nausea / vomiting. ?  ?Oxycodone HCl 10 MG Tabs ?Take 1 tablet (10 mg total) by mouth every 6 (six) hours as  needed. ?  ?PARoxetine 10 MG tablet ?Commonly known as: PAXIL ?Take 10 mg by mouth daily. ?  ?pravastatin 20 MG tablet ?Commonly known as: PRAVACHOL ?Take 20 mg by mouth daily. ?  ?prochlorperazine 10 MG tablet ?Commonly known as: COMPAZINE ?Take 1 tablet (10 mg total) by mouth every 6 (six) hours as needed (Nausea or vomiting). ?  ?sodium chloride 1 g tablet ?Take 2 tablets (2 g total) by mouth 2 (two) times daily with a meal. ?  ?tamsulosin 0.4 MG Caps capsule ?Commonly known as: FLOMAX ?Take 0.4 mg by mouth. ?  ? ?  ? ? ?No Known Allergies ? ?Consultations: ?Oncology ?Nephrology ?Palliative care ? ? ?Procedures/Studies: ?CT ANGIO HEAD NECK W WO CM ? ?Result Date: 08/12/2021 ?CLINICAL DATA:  Provided history: Neuro deficit, acute, stroke suspected. EXAM: CT ANGIOGRAPHY HEAD  AND NECK TECHNIQUE: Multidetector CT imaging of the head and neck was performed using the standard protocol during bolus administration of intravenous contrast. Multiplanar CT image reconstructions and MIPs were ob

## 2021-08-18 NOTE — Consult Note (Signed)
[facility] CM Inpatient Consult ? ? ?08/18/2021 ? ?Talmadge Coventry ?1940/06/06 ?[id number] ? ?[facility] CM) Eligible ?  ?Patient chart reviewed with noted extreme high risk score for unplanned readmission. Assessed for post hospital chronic care coordination needs. Patient eligible for [facility] CM services as benefit of [employer] plan. ? ?Per review, patient followed closely by oncology and primary care teams. No [facility] CM service needs identified at this time.  ? ?Of note, [facility] Care Management services does not replace or interfere with any services that are arranged by inpatient case management or social work.  ? ?Netta Cedars, MSN, RN ?[facility] Liaison ?Toll free office [phone]  ?

## 2021-08-18 NOTE — Progress Notes (Signed)
?[facility]  ?[phone] [phone] ? ?ID: John Perez OB: Feb 13, 1941  MR#: [id number]  QJF#:[id number] ? ?Patient Care Team: ?Derinda Late, MD as PCP - General (Family Medicine) ?Lloyd Huger, MD as Consulting Physician (Oncology) ?Telford Nab, RN as Sales executive ? ?CHIEF COMPLAINT: Embolic CVA, stage III lung cancer. ? ?INTERVAL HISTORY: Patient still has increased weakness and fatigue, but offers no further complaints today.  Expresses a desire to go home. ? ?REVIEW OF SYSTEMS:   ?Review of Systems  ?Constitutional:  Positive for malaise/fatigue. Negative for fever and weight loss.  ?Respiratory: Negative.  Negative for cough and shortness of breath.   ?Cardiovascular: Negative.  Negative for chest pain and leg swelling.  ?Gastrointestinal: Negative.  Negative for abdominal pain.  ?Genitourinary: Negative.  Negative for dysuria.  ?Musculoskeletal: Negative.  Negative for back pain.  ?Skin: Negative.  Negative for rash.  ?Neurological:  Positive for weakness. Negative for dizziness, focal weakness and headaches.  ?Psychiatric/Behavioral: Negative.  The patient is not nervous/anxious.   ? ?As per HPI. Otherwise, a complete review of systems is negative. ? ?PAST MEDICAL HISTORY: ?Past Medical History:  ?Diagnosis Date  ? Diabetes mellitus type 2, insulin dependent ([place])   ? HOH (hard of hearing)   ? Hypercholesteremia   ? Hypertension   ? Hyponatremia   ? ? ?PAST SURGICAL HISTORY: ?Past Surgical History:  ?Procedure Laterality Date  ? CATARACT EXTRACTION W/PHACO Left 02/25/2020  ? Procedure: CATARACT EXTRACTION PHACO AND INTRAOCULAR LENS PLACEMENT ([place]) LEFT DIABETIC;  Surgeon: Eulogio Bear, MD;  Location: [facility];  Service: Ophthalmology;  Laterality: Left;  4.98 ?0:39.4  ? CATARACT EXTRACTION W/PHACO Right 03/17/2020  ? Procedure: CATARACT EXTRACTION PHACO AND INTRAOCULAR LENS PLACEMENT ([facility]) RIGHT DIABETIC;  Surgeon: Eulogio Bear, MD;  Location: [facility];  Service: Ophthalmology;  Laterality: Right;  6.33 ?0:55.3  ? GUM SURGERY    ? HERNIA REPAIR    ? x2  ? ? ?FAMILY HISTORY: ?No family history on file. ? ?ADVANCED DIRECTIVES (Y/N):  @ADVDIR @ ? ?HEALTH MAINTENANCE: ?Social History  ? ?Tobacco Use  ? Smoking status: Former  ?  Packs/day: 1.00  ?  Years: 15.00  ?  Pack years: 15.00  ?  Types: Cigarettes  ?  Quit date: 4  ?  Years since quitting: 33.3  ? Smokeless tobacco: Never  ?Vaping Use  ? Vaping Use: Never used  ?Substance Use Topics  ? Alcohol use: Not Currently  ? ? ? Colonoscopy: ? PAP: ? Bone density: ? Lipid panel: ? ?No Known Allergies ? ?Current Facility-Administered Medications  ?Medication Dose Route Frequency Provider Last Rate Last Admin  ? 0.9 %  sodium chloride infusion  250 mL Intravenous PRN Pokhrel, Laxman, MD      ? acetaminophen (TYLENOL) tablet 650 mg  650 mg Oral Q6H PRN Pokhrel, Laxman, MD      ? Or  ? acetaminophen (TYLENOL) suppository 650 mg  650 mg Rectal Q6H PRN Pokhrel, Laxman, MD      ? albuterol (PROVENTIL) (2.5 MG/3ML) 0.083% nebulizer solution 2.5 mg  2.5 mg Nebulization Q2H PRN Pokhrel, Laxman, MD   2.5 mg at 08/16/21 1117  ? ALPRAZolam Duanne Moron) tablet 0.25 mg  0.25 mg Oral TID PRN Ralene Muskrat B, MD   0.25 mg at 08/17/21 0050  ? amLODipine (NORVASC) tablet 5 mg  5 mg Oral Daily Ralene Muskrat B, MD   5 mg at 08/18/21 0836  ? aspirin EC tablet 81  mg  81 mg Oral Daily Amie Portland, MD   81 mg at 08/18/21 6195  ? benazepril (LOTENSIN) tablet 20 mg  20 mg Oral Daily Pokhrel, Laxman, MD   20 mg at 08/18/21 0836  ? clopidogrel (PLAVIX) tablet 75 mg  75 mg Oral Daily Amie Portland, MD   75 mg at 08/18/21 0836  ? docusate sodium (COLACE) capsule 100 mg  100 mg Oral BID Pokhrel, Laxman, MD   100 mg at 08/17/21 1056  ? feeding supplement (ENSURE ENLIVE / ENSURE PLUS) liquid 237 mL  237 mL Oral TID BM Ralene Muskrat B, MD   237 mL at 08/18/21 0837  ? finasteride (PROSCAR) tablet 5 mg  5  mg Oral Daily Pokhrel, Laxman, MD   5 mg at 08/18/21 0835  ? gabapentin (NEURONTIN) capsule 300 mg  300 mg Oral QHS Ralene Muskrat B, MD   300 mg at 08/17/21 2131  ? guaiFENesin (MUCINEX) 12 hr tablet 600 mg  600 mg Oral BID Pokhrel, Laxman, MD   600 mg at 08/18/21 0836  ? hydrALAZINE (APRESOLINE) injection 10 mg  10 mg Intravenous Q4H PRN Ralene Muskrat B, MD   10 mg at 08/13/21 0820  ? insulin aspart (novoLOG) injection 0-15 Units  0-15 Units Subcutaneous TID WC Ralene Muskrat B, MD   8 Units at 08/18/21 [phone]  ? insulin aspart (novoLOG) injection 0-5 Units  0-5 Units Subcutaneous QHS Foust, Katy L, NP   4 Units at 08/17/21 2045  ? insulin glargine-yfgn (SEMGLEE) injection 8 Units  8 Units Subcutaneous Daily Ralene Muskrat B, MD   8 Units at 08/18/21 [phone]  ? ipratropium-albuterol (DUONEB) 0.5-2.5 (3) MG/3ML nebulizer solution 3 mL  3 mL Nebulization Q6H PRN Sreenath, Sudheer B, MD      ? magnesium oxide (MAG-OX) tablet 400 mg  400 mg Oral Daily Pokhrel, Laxman, MD   400 mg at 08/18/21 0836  ? melatonin tablet 10 mg  10 mg Oral QHS PRN Sharion Settler, NP   10 mg at 08/17/21 2131  ? mirtazapine (REMERON) tablet 15 mg  15 mg Oral QHS Ralene Muskrat B, MD   15 mg at 08/17/21 2131  ? multivitamin with minerals tablet 1 tablet  1 tablet Oral Daily Pokhrel, Laxman, MD   1 tablet at 08/18/21 0836  ? ondansetron (ZOFRAN) tablet 8 mg  8 mg Oral BID PRN Ralene Muskrat B, MD      ? oxyCODONE (Oxy IR/ROXICODONE) immediate release tablet 10 mg  10 mg Oral Q6H PRN Sreenath, Sudheer B, MD      ? PARoxetine (PAXIL) tablet 10 mg  10 mg Oral Daily Ralene Muskrat B, MD   10 mg at 08/18/21 0836  ? pravastatin (PRAVACHOL) tablet 20 mg  20 mg Oral Daily Pokhrel, Laxman, MD   20 mg at 08/18/21 0836  ? sodium chloride (OCEAN) 0.65 % nasal spray 1 spray  1 spray Each Nare PRN Foust, Katy L, NP   1 spray at 08/17/21 0826  ? sodium chloride flush (NS) 0.9 % injection 3 mL  3 mL Intravenous Q12H Pokhrel, Laxman, MD   3  mL at 08/17/21 1102  ? sodium chloride flush (NS) 0.9 % injection 3 mL  3 mL Intravenous Q12H Pokhrel, Laxman, MD   3 mL at 08/18/21 0841  ? sodium chloride flush (NS) 0.9 % injection 3 mL  3 mL Intravenous PRN Pokhrel, Laxman, MD      ? sodium chloride tablet 2 g  2 g Oral  BID WC Pokhrel, Laxman, MD   2 g at 08/18/21 0836  ? tamsulosin (FLOMAX) capsule 0.4 mg  0.4 mg Oral QPC supper Pokhrel, Laxman, MD   0.4 mg at 08/17/21 1745  ? ? ?OBJECTIVE: ?Vitals:  ? 08/18/21 0457 08/18/21 0806  ?BP: (!) 133/94 140/85  ?Pulse: (!) 104 (!) 105  ?Resp: 16 20  ?Temp: 98.8 ?F (37.1 ?C) 98 ?F (36.7 ?C)  ?SpO2: 99% 95%  ?   Body mass index is 17.43 kg/m?Marland Kitchen    ECOG FS:2 - Symptomatic, <50% confined to bed ? ?General: Thin, no acute distress. ?Eyes: Pink conjunctiva, anicteric sclera. ?HEENT: Normocephalic, moist mucous membranes. ?Lungs: No audible wheezing or coughing. ?Heart: Regular rate and rhythm. ?Abdomen: Soft, nontender, no obvious distention. ?Musculoskeletal: No edema, cyanosis, or clubbing. ?Neuro: Alert, answering all questions appropriately. Cranial nerves grossly intact. ?Skin: No rashes or petechiae noted. ?Psych: Normal affect. ? ? ?LAB RESULTS: ? ?Lab Results  ?Component Value Date  ? NA 130 (L) 08/18/2021  ? K 3.9 08/18/2021  ? CL 96 (L) 08/18/2021  ? CO2 24 08/18/2021  ? GLUCOSE 314 (H) 08/18/2021  ? BUN 21 08/18/2021  ? CREATININE 0.67 08/18/2021  ? CALCIUM 8.5 (L) 08/18/2021  ? PROT 5.8 (L) 08/14/2021  ? ALBUMIN 3.0 (L) 08/14/2021  ? AST 37 08/14/2021  ? ALT 30 08/14/2021  ? ALKPHOS 239 (H) 08/14/2021  ? BILITOT 0.9 08/14/2021  ? GFRNONAA >60 08/18/2021  ? ? ?Lab Results  ?Component Value Date  ? WBC 12.3 (H) 08/17/2021  ? NEUTROABS 10.4 (H) 08/17/2021  ? HGB 12.4 (L) 08/17/2021  ? HCT 35.0 (L) 08/17/2021  ? MCV 97.0 08/17/2021  ? PLT 153 08/17/2021  ? ? ? ?STUDIES: ?CT ANGIO HEAD NECK W WO CM ? ?Result Date: 08/12/2021 ?CLINICAL DATA:  Provided history: Neuro deficit, acute, stroke suspected. EXAM: CT ANGIOGRAPHY  HEAD AND NECK TECHNIQUE: Multidetector CT imaging of the head and neck was performed using the standard protocol during bolus administration of intravenous contrast. Multiplanar CT image reconstructions

## 2021-08-18 NOTE — Progress Notes (Signed)
[facility]  ROUNDING NOTE   Subjective:   John Perez is a 81 year old male with past medical conditions including hyperlipidemia, diabetes, hypertension, lung cancer, and chronic hyponatremia.  Patient presents to the emergency department with increased weakness and confusion.  Patient has been admitted for Syncope and collapse [R55] Brain lesion [G93.9] Malignant neoplasm of lung, unspecified laterality, unspecified part of lung (HCC) [C34.90] Altered mental status, unspecified altered mental status type [R41.82] Acute metabolic encephalopathy [G93.41]  Patient is known to our practice from previous admissions for the same concern.    Patient seen today on first floor, patient resting comfortably in the bed Patient family present in the room. Patient offers no new specific physical complaints. I then discussed patient's hyponatremia related issues with the patient and his family  Objective:  Vital signs in last 24 hours:  Temp:  [97.8 F (36.6 C)-98.8 F (37.1 C)] 98 F (36.7 C) (05/09 0806) Pulse Rate:  [57-105] 105 (05/09 0806) Resp:  [16-20] 20 (05/09 0806) BP: (133-156)/(73-94) 140/85 (05/09 0806) SpO2:  [86 %-99 %] 95 % (05/09 0806) Weight:  [55.1 kg] 55.1 kg (05/09 0500)  Weight change:  Filed Weights   08/15/21 0500 08/16/21 0500 08/18/21 0500  Weight: 54.6 kg 53.4 kg 55.1 kg    Intake/Output: I/O last 3 completed shifts: In: 10 [I.V.:10] Out: 150 [Urine:150]   Intake/Output this shift:  No intake/output data recorded.  Physical Exam: General: NAD,  Head: Moist oral mucosal membranes  Eyes: Anicteric  Lungs:  Clear to auscultation, normal effort, room air  Heart: Regular rate and rhythm  Abdomen:  Soft, nontender, nondistended  Extremities: No peripheral edema.  Neurologic: Resting quietly this am  Skin: No lesions    Basic Metabolic Panel: Recent Labs  Lab 08/12/21 0537 08/13/21 0406 08/14/21 0748 08/14/21 1013 08/14/21 1751  08/15/21 0111 08/15/21 1246 08/16/21 0749 08/16/21 1349 08/17/21 0820 08/18/21 0913  NA 123* 126*   < > 122*   < > 129* 130* 131*  --  130* 130*  K 3.8 3.8   < > 3.6  --   --  4.2 3.1*  --  4.3 3.9  CL 88* 92*   < > 90*  --   --  96* 95*  --  93* 96*  CO2 22 23   < > 21*  --   --  22 25  --  25 24  GLUCOSE 316* 186*   < > 321*  --   --  246* 209* 432* 321* 314*  BUN 11 14   < > 17  --   --  15 12  --  19 21  CREATININE 0.62 0.44*   < > 0.43*  --   --  0.47* 0.50*  --  0.54* 0.67  CALCIUM 8.2* 8.3*   < > 8.4*  --   --  8.6* 8.3*  --  8.5* 8.5*  MG 1.6*  --   --   --   --   --   --   --   --   --   --   PHOS  --  2.9  --   --   --   --   --   --   --   --   --    < > = values in this interval not displayed.    Liver Function Tests: Recent Labs  Lab 08/12/21 0537 08/13/21 0406 08/14/21 1013  AST 31  --  37  ALT 24  --  30  ALKPHOS 161*  --  239*  BILITOT 1.6*  --  0.9  PROT 5.9*  --  5.8*  ALBUMIN 3.0* 2.8* 3.0*   No results for input(s): LIPASE, AMYLASE in the last 168 hours. No results for input(s): AMMONIA in the last 168 hours.  CBC: Recent Labs  Lab 08/12/21 0537 08/15/21 1246 08/17/21 0820  WBC 9.0 12.4* 12.3*  NEUTROABS  --  10.7* 10.4*  HGB 10.9* 12.4* 12.4*  HCT 30.5* 35.1* 35.0*  MCV 95.0 95.9 97.0  PLT 196 222 153    Cardiac Enzymes: No results for input(s): CKTOTAL, CKMB, CKMBINDEX, TROPONINI in the last 168 hours.  BNP: Invalid input(s): POCBNP  CBG: Recent Labs  Lab 08/17/21 0751 08/17/21 1140 08/17/21 1728 08/17/21 1933 08/18/21 0720  GLUCAP 319* 296* 298* 340* 275*    Microbiology: Results for orders placed or performed during the hospital encounter of 06/10/21  Urine Culture     Status: Abnormal   Collection Time: 06/10/21  3:42 PM   Specimen: Urine, Random  Result Value Ref Range Status   Specimen Description   Final    URINE, RANDOM Performed at [facility], [address] [place], [place] [address]    Special  Requests   Final    Normal Performed at [facility], [address] [place], [place] [address]    Culture >=100,000 COLONIES/mL SERRATIA MARCESCENS (A)  Final   Report Status 06/13/2021 FINAL  Final   Organism ID, Bacteria SERRATIA MARCESCENS (A)  Final      Susceptibility   Serratia marcescens - MIC*    CEFAZOLIN >=64 RESISTANT Resistant     CEFEPIME <=0.12 SENSITIVE Sensitive     CEFTRIAXONE <=0.25 SENSITIVE Sensitive     CIPROFLOXACIN <=0.25 SENSITIVE Sensitive     GENTAMICIN <=1 SENSITIVE Sensitive     NITROFURANTOIN 256 RESISTANT Resistant     TRIMETH/SULFA <=20 SENSITIVE Sensitive     * >=100,000 COLONIES/mL SERRATIA MARCESCENS  Resp Panel by RT-PCR (Flu A&B, Covid) Nasopharyngeal Swab     Status: None   Collection Time: 06/10/21  4:41 PM   Specimen: Nasopharyngeal Swab; Nasopharyngeal(NP) swabs in vial transport medium  Result Value Ref Range Status   SARS Coronavirus 2 by RT PCR NEGATIVE NEGATIVE Final    Comment: (NOTE) SARS-CoV-2 target nucleic acids are NOT DETECTED.  The SARS-CoV-2 RNA is generally detectable in upper respiratory specimens during the acute phase of infection. The lowest concentration of SARS-CoV-2 viral copies this assay can detect is 138 copies/mL. A negative result does not preclude SARS-Cov-2 infection and should not be used as the sole basis for treatment or other patient management decisions. A negative result may occur with  improper specimen collection/handling, submission of specimen other than nasopharyngeal swab, presence of viral mutation(s) within the areas targeted by this assay, and inadequate number of viral copies(<138 copies/mL). A negative result must be combined with clinical observations, patient history, and epidemiological information. The expected result is Negative.  Fact Sheet for Patients:  BloggerCourse.com  Fact Sheet for Healthcare Providers:   SeriousBroker.it  This test is no t yet approved or cleared by the [place] FDA and  has been authorized for detection and/or diagnosis of SARS-CoV-2 by FDA under an Emergency Use Authorization (EUA). This EUA will remain  in effect (meaning this test can be used) for the duration of the COVID-19 declaration under Section 564(b)(1) of the Act, 21 U.S.C.section 360bbb-3(b)(1), unless the authorization is terminated  or revoked sooner.       Influenza A by PCR NEGATIVE NEGATIVE Final   Influenza B by PCR NEGATIVE NEGATIVE Final    Comment: (NOTE) The Xpert Xpress SARS-CoV-2/FLU/RSV plus assay is intended as an aid in the diagnosis of influenza from Nasopharyngeal swab specimens and should not be used as a sole basis for treatment. Nasal washings and aspirates are unacceptable for Xpert Xpress SARS-CoV-2/FLU/RSV testing.  Fact Sheet for Patients: BloggerCourse.com  Fact Sheet for Healthcare Providers: SeriousBroker.it  This test is not yet approved or cleared by the [place] FDA and has been authorized for detection and/or diagnosis of SARS-CoV-2 by FDA under an Emergency Use Authorization (EUA). This EUA will remain in effect (meaning this test can be used) for the duration of the COVID-19 declaration under Section 564(b)(1) of the Act, 21 U.S.C. section 360bbb-3(b)(1), unless the authorization is terminated or revoked.  Performed at [facility], [address] [place], [place] [address]     Coagulation Studies: No results for input(s): LABPROT, INR in the last 72 hours.  Urinalysis: No results for input(s): COLORURINE, LABSPEC, PHURINE, GLUCOSEU, HGBUR, BILIRUBINUR, KETONESUR, PROTEINUR, UROBILINOGEN, NITRITE, LEUKOCYTESUR in the last 72 hours.  Invalid input(s): APPERANCEUR     Imaging: DG Chest Port 1 View  Result Date: 08/17/2021 CLINICAL DATA:  Shortness of breath.  EXAM: PORTABLE CHEST 1 VIEW COMPARISON:  CT chest 08/11/2021; AP chest 08/11/2021 and 06/16/2021; PET-CT 07/07/2021 FINDINGS: Cardiac silhouette is within normal limits. Mild calcification again seen within the aortic arch. Known right perihilar/right major fissure primary lung malignancy/mass is again noted; better visualized on prior CT. There is again a skin fold overlying the mid to inferior left hemithorax. There are again diffuse heterogeneous airspace opacities throughout the right lung. There are slightly improved lung volumes with slightly improved aeration of the right lung. Bilateral moderate interstitial thickening. There again is a subtle nodular density overlying the inferolateral left lung. The prior slightly more superomedial but still left lower lung nodular density described on 08/11/2021 radiograph is no longer visualized; in retrospect, the nodular density visualized on the current study likely represents the patient's left nipple shadow, unchanged from 06/16/2021 frontal chest radiograph. Additionally, no left lower lung pulmonary nodule is seen on the recent 08/11/2021 CT. Blunting of the right costophrenic angle related to the mild-to-moderate right pleural effusion seen on prior CT. No pneumothorax. Note is made of diffuse scattered sclerotic skeletal metastases and high-grade height loss of the T7 vertebral body as seen on prior CT. IMPRESSION:: IMPRESSION: 1. Mildly improved aeration compared to most recent 08/11/2021 radiographs. 2. Diffuse right lung heterogeneous airspace opacities and bilateral moderate interstitial thickening is unchanged to mildly improved from 08/11/2021. 3. Note is made of right perihilar major fissure bronchogenic carcinoma seen on prior PET-CT. Electronically Signed   By: Neita Garnet M.D.   On: 08/17/2021 13:33     Medications:    sodium chloride      amLODipine  5 mg Oral Daily   aspirin EC  81 mg Oral Daily   benazepril  20 mg Oral Daily    clopidogrel  75 mg Oral Daily   docusate sodium  100 mg Oral BID   feeding supplement  237 mL Oral TID BM   finasteride  5 mg Oral Daily   gabapentin  300 mg Oral QHS   guaiFENesin  600 mg Oral BID   insulin aspart  0-15 Units Subcutaneous TID WC   insulin aspart  0-5 Units Subcutaneous QHS  insulin glargine-yfgn  8 Units Subcutaneous Daily   magnesium oxide  400 mg Oral Daily   mirtazapine  15 mg Oral QHS   multivitamin with minerals  1 tablet Oral Daily   PARoxetine  10 mg Oral Daily   pravastatin  20 mg Oral Daily   sodium chloride flush  3 mL Intravenous Q12H   sodium chloride flush  3 mL Intravenous Q12H   sodium chloride  2 g Oral BID WC   tamsulosin  0.4 mg Oral QPC supper   sodium chloride, acetaminophen **OR** acetaminophen, albuterol, ALPRAZolam, hydrALAZINE, ipratropium-albuterol, melatonin, ondansetron, oxyCODONE, sodium chloride, sodium chloride flush  Assessment/ Plan:  Mr. John Perez is a 81 y.o.  male past medical conditions including hyperlipidemia, diabetes, hypertension, lung cancer, and chronic hyponatremia.  Patient presents to the emergency department with increased weakness and confusion.  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 History of lung cancer. Patient recently taken off salts tabs outpatient and liberalized fluid restriction. Admission sodium 119. continue sodium tabs 2g BID.  Sodium is 130 this morning.  Continue sodium chloride supplementation Encourage oral nutrition  2. Hypertension.   -Patient blood pressure is at goal for age of 81 years old  -Current regimen includes amlodipine, benazepril  3. Hypokalemia  Likely due to poor nutrition  May replace with oral  supplements as tolerated            Now better  4. Diabetes mellitus type II, unspecified: noninsulin dependent.  Most recent hemoglobin A1c is 6.6 on 06/10/21.  Metformin held   Glucose elevated. Primary team to manage SSI   LOS: 7 John Perez s [place] 5/9/20239:44 AM

## 2021-08-19 ENCOUNTER — Ambulatory Visit: Payer: Medicare HMO

## 2021-08-20 ENCOUNTER — Ambulatory Visit: Payer: Medicare HMO

## 2021-08-20 ENCOUNTER — Telehealth: Payer: Self-pay | Admitting: *Deleted

## 2021-08-20 DIAGNOSIS — C3491 Malignant neoplasm of unspecified part of right bronchus or lung: Secondary | ICD-10-CM

## 2021-08-20 NOTE — Addendum Note (Signed)
Addended by: Telford Nab on: 08/20/2021 02:19 PM ? ? Modules accepted: Orders ? ?

## 2021-08-20 NOTE — Telephone Encounter (Signed)
Spoke with pt's daughter, Almyra Free, who stated they noticed pt's oxygen level stayed around 86% on room air while sleeping last night. Informed that can check pt's oxygen levels in the clinic when he comes in tomorrow for his appt and order oxygen supplies if he qualifies. Informed Almyra Free that will order overnight oximetry testing as well in case he does not qualify for oxygen at his visit tomorrow. Informed that if pt's levels drop further then will need further evaluation in the ED. Almyra Free verbalized understanding.  ?

## 2021-08-21 ENCOUNTER — Inpatient Hospital Stay (HOSPITAL_BASED_OUTPATIENT_CLINIC_OR_DEPARTMENT_OTHER): Payer: Medicare HMO | Admitting: Oncology

## 2021-08-21 ENCOUNTER — Telehealth: Payer: Self-pay | Admitting: Emergency Medicine

## 2021-08-21 ENCOUNTER — Ambulatory Visit: Payer: Medicare HMO

## 2021-08-21 ENCOUNTER — Inpatient Hospital Stay: Payer: Medicare HMO

## 2021-08-21 ENCOUNTER — Inpatient Hospital Stay (HOSPITAL_BASED_OUTPATIENT_CLINIC_OR_DEPARTMENT_OTHER): Payer: Medicare HMO | Admitting: Hospice and Palliative Medicine

## 2021-08-21 ENCOUNTER — Encounter: Payer: Self-pay | Admitting: *Deleted

## 2021-08-21 VITALS — BP 81/57 | HR 76 | Temp 98.2°F | Resp 24 | Ht 70.0 in | Wt 114.0 lb

## 2021-08-21 DIAGNOSIS — Z515 Encounter for palliative care: Secondary | ICD-10-CM

## 2021-08-21 DIAGNOSIS — E119 Type 2 diabetes mellitus without complications: Secondary | ICD-10-CM | POA: Diagnosis not present

## 2021-08-21 DIAGNOSIS — D72829 Elevated white blood cell count, unspecified: Secondary | ICD-10-CM | POA: Diagnosis not present

## 2021-08-21 DIAGNOSIS — R0602 Shortness of breath: Secondary | ICD-10-CM | POA: Diagnosis not present

## 2021-08-21 DIAGNOSIS — E871 Hypo-osmolality and hyponatremia: Secondary | ICD-10-CM | POA: Diagnosis not present

## 2021-08-21 DIAGNOSIS — C3491 Malignant neoplasm of unspecified part of right bronchus or lung: Secondary | ICD-10-CM

## 2021-08-21 DIAGNOSIS — R531 Weakness: Secondary | ICD-10-CM | POA: Diagnosis not present

## 2021-08-21 DIAGNOSIS — R5383 Other fatigue: Secondary | ICD-10-CM | POA: Diagnosis not present

## 2021-08-21 DIAGNOSIS — E876 Hypokalemia: Secondary | ICD-10-CM | POA: Diagnosis not present

## 2021-08-21 DIAGNOSIS — C7951 Secondary malignant neoplasm of bone: Secondary | ICD-10-CM | POA: Diagnosis not present

## 2021-08-21 LAB — COMPREHENSIVE METABOLIC PANEL
ALT: 44 U/L (ref 0–44)
AST: 52 U/L — ABNORMAL HIGH (ref 15–41)
Albumin: 2.8 g/dL — ABNORMAL LOW (ref 3.5–5.0)
Alkaline Phosphatase: 248 U/L — ABNORMAL HIGH (ref 38–126)
Anion gap: 18 — ABNORMAL HIGH (ref 5–15)
BUN: 27 mg/dL — ABNORMAL HIGH (ref 8–23)
CO2: 18 mmol/L — ABNORMAL LOW (ref 22–32)
Calcium: 8.8 mg/dL — ABNORMAL LOW (ref 8.9–10.3)
Chloride: 98 mmol/L (ref 98–111)
Creatinine, Ser: 0.7 mg/dL (ref 0.61–1.24)
GFR, Estimated: >60 mL/min (ref >60)
Glucose, Bld: 209 mg/dL — ABNORMAL HIGH (ref 70–99)
Potassium: 3.3 mmol/L — ABNORMAL LOW (ref 3.5–5.1)
Sodium: 134 mmol/L — ABNORMAL LOW (ref 135–145)
Total Bilirubin: 1.9 mg/dL — ABNORMAL HIGH (ref 0.3–1.2)
Total Protein: 5.9 g/dL — ABNORMAL LOW (ref 6.5–8.1)

## 2021-08-21 LAB — CBC WITH DIFFERENTIAL/PLATELET
Abs Immature Granulocytes: 0.36 10*3/uL — ABNORMAL HIGH (ref 0.00–0.07)
Basophils Absolute: 0 10*3/uL (ref 0.0–0.1)
Basophils Relative: 0 %
Eosinophils Absolute: 0 10*3/uL (ref 0.0–0.5)
Eosinophils Relative: 0 %
HCT: 35.8 % — ABNORMAL LOW (ref 39.0–52.0)
Hemoglobin: 12.7 g/dL — ABNORMAL LOW (ref 13.0–17.0)
Immature Granulocytes: 2 %
Lymphocytes Relative: 5 %
Lymphs Abs: 0.9 10*3/uL (ref 0.7–4.0)
MCH: 34.8 pg — ABNORMAL HIGH (ref 26.0–34.0)
MCHC: 35.5 g/dL (ref 30.0–36.0)
MCV: 98.1 fL (ref 80.0–100.0)
Monocytes Absolute: 1.1 10*3/uL — ABNORMAL HIGH (ref 0.1–1.0)
Monocytes Relative: 6 %
Neutro Abs: 15.5 10*3/uL — ABNORMAL HIGH (ref 1.7–7.7)
Neutrophils Relative %: 87 %
Platelets: 127 10*3/uL — ABNORMAL LOW (ref 150–400)
RBC: 3.65 MIL/uL — ABNORMAL LOW (ref 4.22–5.81)
RDW: 13.2 % (ref 11.5–15.5)
WBC: 17.8 10*3/uL — ABNORMAL HIGH (ref 4.0–10.5)
nRBC: 0 % (ref 0.0–0.2)

## 2021-08-21 MED ORDER — MORPHINE SULFATE (CONCENTRATE) 10 MG /0.5 ML PO SOLN: 5.0000 mg | ORAL | 0 refills | Status: DC | PRN

## 2021-08-21 MED ORDER — LORAZEPAM 0.5 MG PO TABS: 0.5000 mg | ORAL_TABLET | Freq: Three times a day (TID) | ORAL | 0 refills | Status: AC

## 2021-08-21 NOTE — Telephone Encounter (Signed)
Called patient's wife per Merrily Pew, NP request. LVM regarding Hospice appt at 6pm today. Also made her aware of morphine and ativan rx sent to pharmacy. ?

## 2021-08-21 NOTE — Progress Notes (Signed)
? ?  ?Palliative Medicine ?[place] at [facility] ?Telephone:(336) [phone] Fax:(336) [phone] ? ? ?Name: John Perez ?Date: 08/21/2021 ?MRN: [id number]  ?DOB: [birth year] ? ?Patient Care Team: ?Derinda Late, MD as PCP - General (Family Medicine) ?Lloyd Huger, MD as Consulting Physician (Oncology) ?Telford Nab, RN as Sales executive  ? ? ?REASON FOR CONSULTATION: ?John Perez is a 81 y.o. male with multiple medical problems including for adenocarcinoma the lung.  Patient was recently hospitalized with hemorrhagic stroke and CT of the chest revealed rapid progression of lung cancer with lymphangitic spread and new manubrium met.  Patient returns to clinic today.  Palliative care is consulted to help address goals. ? ?SOCIAL HISTORY:    ? reports that he quit smoking about 33 years ago. His smoking use included cigarettes. He has a 15.00 pack-year smoking history. He has never used smokeless tobacco. He reports that he does not currently use alcohol. ? ?Patient is married lives at home with his wife.  They have 5 sons and 5 daughters.  Patient previously worked as a Art gallery manager. ? ?ADVANCE DIRECTIVES:  ?None on file ? ?CODE STATUS: DNR ? ?PAST MEDICAL HISTORY: ?Past Medical History:  ?Diagnosis Date  ? Diabetes mellitus type 2, insulin dependent ([place])   ? HOH (hard of hearing)   ? Hypercholesteremia   ? Hypertension   ? Hyponatremia   ? ? ?PAST SURGICAL HISTORY:  ?Past Surgical History:  ?Procedure Laterality Date  ? CATARACT EXTRACTION W/PHACO Left 02/25/2020  ? Procedure: CATARACT EXTRACTION PHACO AND INTRAOCULAR LENS PLACEMENT ([place]) LEFT DIABETIC;  Surgeon: Eulogio Bear, MD;  Location: [facility];  Service: Ophthalmology;  Laterality: Left;  4.98 ?0:39.4  ? CATARACT EXTRACTION W/PHACO Right 03/17/2020  ? Procedure: CATARACT EXTRACTION PHACO AND INTRAOCULAR LENS PLACEMENT ([place]) RIGHT DIABETIC;  Surgeon: Eulogio Bear, MD;  Location: [facility];  Service: Ophthalmology;  Laterality: Right;  6.33 ?0:55.3  ? GUM SURGERY    ? HERNIA REPAIR    ? x2  ? ? ?HEMATOLOGY/ONCOLOGY HISTORY:  ?Oncology History  ?Adenocarcinoma, lung, right ([place])  ?07/23/2021 Initial Diagnosis  ? Adenocarcinoma, lung, right ([place]) ? ?  ?07/23/2021 Cancer Staging  ? Staging form: Lung, AJCC 8th Edition ?- Clinical stage from 07/23/2021: Stage IIIB (cT1c, cN3, cM0) - Signed by Lloyd Huger, MD on 07/23/2021 ?Stage prefix: Initial diagnosis ? ?  ?08/21/2021 - 08/21/2021 Chemotherapy  ? Patient is on Treatment Plan : LUNG Carboplatin / Paclitaxel + XRT q7d  ? ?   ? ? ?ALLERGIES:  has No Known Allergies. ? ?MEDICATIONS:  ?Current Outpatient Medications  ?Medication Sig Dispense Refill  ? LORazepam (ATIVAN) 0.5 MG tablet Take 1 tablet (0.5 mg total) by mouth every 8 (eight) hours. 30 tablet 0  ? Morphine Sulfate (MORPHINE CONCENTRATE) 10 mg / 0.5 ml concentrated solution Take 0.25-0.5 mLs (5-10 mg total) by mouth every 2 (two) hours as needed for severe pain. 30 mL 0  ? ASPIRIN 81 PO Take by mouth daily.    ? clopidogrel (PLAVIX) 75 MG tablet Take 1 tablet (75 mg total) by mouth daily. 30 tablet 0  ? finasteride (PROSCAR) 5 MG tablet Take 5 mg by mouth daily.    ? gabapentin (NEURONTIN) 300 MG capsule Take 1 capsule by mouth at bedtime.    ? guaiFENesin (MUCINEX) 600 MG 12 hr tablet Take 1 tablet (600 mg total) by mouth 2 (two) times daily.    ? MELATONIN PO Take 5 mg  by mouth at bedtime as needed.    ? metFORMIN (GLUCOPHAGE) 1000 MG tablet Take 1 tablet (1,000 mg total) by mouth 2 (two) times daily with a meal. 60 tablet 1  ? mirtazapine (REMERON) 15 MG tablet Take 15 mg by mouth at bedtime.    ? Oxycodone HCl 10 MG TABS Take 1 tablet (10 mg total) by mouth every 6 (six) hours as needed. 60 tablet 0  ? PARoxetine (PAXIL) 10 MG tablet Take 10 mg by mouth daily.    ? pravastatin (PRAVACHOL) 20 MG tablet Take 20 mg by mouth daily.    ? sodium chloride 1 g tablet Take 2 tablets (2 g total)  by mouth 2 (two) times daily with a meal. 120 tablet 0  ? tamsulosin (FLOMAX) 0.4 MG CAPS capsule Take 0.4 mg by mouth.    ? ?No current facility-administered medications for this visit.  ? ? ?VITAL SIGNS: ?There were no vitals taken for this visit. ?There were no vitals filed for this visit.  ?Estimated body mass index is 16.36 kg/m? as calculated from the following: ?  Height as of an earlier encounter on 08/21/21: $RemoveBef'5\' 10"'ftlsJqcyge$  (1.778 m). ?  Weight as of an earlier encounter on 08/21/21: 114 lb (51.7 kg). ? ?LABS: ?CBC: ?   ?Component Value Date/Time  ? WBC 17.8 (H) 08/21/2021 2694  ? HGB 12.7 (L) 08/21/2021 8546  ? HCT 35.8 (L) 08/21/2021 2703  ? PLT 127 (L) 08/21/2021 5009  ? MCV 98.1 08/21/2021 0907  ? NEUTROABS 15.5 (H) 08/21/2021 3818  ? LYMPHSABS 0.9 08/21/2021 0907  ? MONOABS 1.1 (H) 08/21/2021 2993  ? EOSABS 0.0 08/21/2021 0907  ? BASOSABS 0.0 08/21/2021 0907  ? ?Comprehensive Metabolic Panel: ?   ?Component Value Date/Time  ? NA 134 (L) 08/21/2021 7169  ? K 3.3 (L) 08/21/2021 6789  ? CL 98 08/21/2021 0907  ? CO2 18 (L) 08/21/2021 3810  ? BUN 27 (H) 08/21/2021 1751  ? CREATININE 0.70 08/21/2021 0907  ? GLUCOSE 209 (H) 08/21/2021 0258  ? CALCIUM 8.8 (L) 08/21/2021 5277  ? AST 52 (H) 08/21/2021 8242  ? ALT 44 08/21/2021 0907  ? ALKPHOS 248 (H) 08/21/2021 3536  ? BILITOT 1.9 (H) 08/21/2021 1443  ? PROT 5.9 (L) 08/21/2021 1540  ? ALBUMIN 2.8 (L) 08/21/2021 0907  ? ? ?RADIOGRAPHIC STUDIES: ?CT ANGIO HEAD NECK W WO CM ? ?Result Date: 08/12/2021 ?CLINICAL DATA:  Provided history: Neuro deficit, acute, stroke suspected. EXAM: CT ANGIOGRAPHY HEAD AND NECK TECHNIQUE: Multidetector CT imaging of the head and neck was performed using the standard protocol during bolus administration of intravenous contrast. Multiplanar CT image reconstructions and MIPs were obtained to evaluate the vascular anatomy. Carotid stenosis measurements (when applicable) are obtained utilizing NASCET criteria, using the distal internal carotid diameter  as the denominator. RADIATION DOSE REDUCTION: This exam was performed according to the departmental dose-optimization program which includes automated exposure control, adjustment of the mA and/or kV according to patient size and/or use of iterative reconstruction technique. CONTRAST:  66mL OMNIPAQUE IOHEXOL 350 MG/ML SOLN COMPARISON:  Brain MRI 08/11/2021. Head CT 08/11/2021. Chest CT 08/11/2021. FINDINGS: CT HEAD FINDINGS Brain: Mild-to-moderate generalized cerebral atrophy. Comparatively mild cerebellar atrophy. Redemonstrated subacute cortical/subcortical infarct within the left parietooccipital lobes. As before, there is mild petechial hemorrhage within the infarction territory. Unchanged confluent region of hypoattenuation within the left parietal white matter. No associated mass effect is appreciated this is favored to reflect chronic white matter ischemia. No extra-axial fluid collection. No midline  shift. Vascular: No hyperdense vessel. Atherosclerotic calcifications. Skull: No acute fracture or aggressive osseous lesion. Sinuses: Small mucous retention cyst within the right maxillary sinus. Orbits: No orbital mass or acute orbital finding. Review of the MIP images confirms the above findings CTA NECK FINDINGS Aortic arch: Standard aortic branching. Atherosclerotic plaque within the visualized aortic arch and proximal major branch vessels of the neck. No hemodynamically significant innominate or proximal subclavian artery stenosis. Right carotid system: Streak and beam hardening artifact arising from a dense right-sided contrast bolus limits evaluation of the proximal CCA. Within this limitation, the CCA and ICA are patent within the neck without stenosis. Mild atherosclerotic plaque about the carotid bifurcation. Tortuosity of the cervical ICA. Left carotid system: CCA and ICA patent within the neck without stenosis. Mild atherosclerotic plaque at the CCA origin and about the carotid bifurcation.  Tortuosity of the cervical ICA. Vertebral arteries: Vertebral arteries patent within the neck without significant stenosis. Skeleton: Unchanged vertebral compression fractures at T1, T4 and T5. Cervical spondyl

## 2021-08-21 NOTE — Progress Notes (Signed)
Pt had difficulty remembering birthday. Pt short of breath from talking. Unable to obtain O2 sats. Fingers cool to touch. 2L [place] placed for comfort. Pt wife states they do have "emergency oxygen" at home that has not been used. Also states that last O2 per home health nurse was 88 on RA.  Oxygen came up to 100% while on 2L . Pt still appears SOB ?

## 2021-08-21 NOTE — Progress Notes (Signed)
?[place]  ?[phone]) [phone] [phone] ? ?ID: John Perez OB: [birth year]  MR#: [id number]  RXV#:[id number] ? ?Patient Care Team: ?Derinda Late, MD as PCP - General (Family Medicine) ?Lloyd Huger, MD as Consulting Physician (Oncology) ?Telford Nab, RN as Sales executive ? ?CHIEF COMPLAINT: Stage IV adenocarcinoma of the lung. ? ?INTERVAL HISTORY: Patient returns to clinic today for hospital follow-up and further evaluation.  His performance status has significantly declined.  He has worsening shortness of breath and difficult time keeping his oxygen saturations above 90% without supplemental oxygen.  He has increased weakness and fatigue.  He has no neurologic complaints.  He does not complain of pain.  He denies any fevers.  He has no chest pain, cough, or hemoptysis.  He denies any nausea, vomiting, constipation, or diarrhea.  He has no urinary complaints.  Patient feels generally terrible, but offers no further specific complaints today. ? ?REVIEW OF SYSTEMS:   ?Review of Systems  ?Constitutional:  Positive for malaise/fatigue and weight loss. Negative for fever.  ?Respiratory:  Positive for shortness of breath. Negative for cough and hemoptysis.   ?Cardiovascular: Negative.  Negative for chest pain and leg swelling.  ?Gastrointestinal: Negative.  Negative for abdominal pain.  ?Genitourinary: Negative.  Negative for dysuria and hematuria.  ?Musculoskeletal: Negative.  Negative for back pain.  ?Skin: Negative.  Negative for rash.  ?Neurological:  Positive for weakness. Negative for dizziness, focal weakness and headaches.  ?Psychiatric/Behavioral: Negative.  The patient is not nervous/anxious and does not have insomnia.   ? ?As per HPI. Otherwise, a complete review of systems is negative. ? ?PAST MEDICAL HISTORY: ?Past Medical History:  ?Diagnosis Date  ? Diabetes mellitus type 2, insulin dependent ([place])   ? HOH (hard of hearing)   ? Hypercholesteremia   ?  Hypertension   ? Hyponatremia   ? ? ?PAST SURGICAL HISTORY: ?Past Surgical History:  ?Procedure Laterality Date  ? CATARACT EXTRACTION W/PHACO Left 02/25/2020  ? Procedure: CATARACT EXTRACTION PHACO AND INTRAOCULAR LENS PLACEMENT ([place]) LEFT DIABETIC;  Surgeon: Eulogio Bear, MD;  Location: [facility];  Service: Ophthalmology;  Laterality: Left;  4.98 ?0:39.4  ? CATARACT EXTRACTION W/PHACO Right 03/17/2020  ? Procedure: CATARACT EXTRACTION PHACO AND INTRAOCULAR LENS PLACEMENT ([place]) RIGHT DIABETIC;  Surgeon: Eulogio Bear, MD;  Location: [facility];  Service: Ophthalmology;  Laterality: Right;  6.33 ?0:55.3  ? GUM SURGERY    ? HERNIA REPAIR    ? x2  ? ? ?FAMILY HISTORY: ?No family history on file. ? ?ADVANCED DIRECTIVES (Y/N):  N ? ?HEALTH MAINTENANCE: ?Social History  ? ?Tobacco Use  ? Smoking status: Former  ?  Packs/day: 1.00  ?  Years: 15.00  ?  Pack years: 15.00  ?  Types: Cigarettes  ?  Quit date: 95  ?  Years since quitting: 33.3  ? Smokeless tobacco: Never  ?Vaping Use  ? Vaping Use: Never used  ?Substance Use Topics  ? Alcohol use: Not Currently  ? ? ? Colonoscopy: ? PAP: ? Bone density: ? Lipid panel: ? ?No Known Allergies ? ?Current Outpatient Medications  ?Medication Sig Dispense Refill  ? ASPIRIN 81 PO Take by mouth daily.    ? clopidogrel (PLAVIX) 75 MG tablet Take 1 tablet (75 mg total) by mouth daily. 30 tablet 0  ? finasteride (PROSCAR) 5 MG tablet Take 5 mg by mouth daily.    ? gabapentin (NEURONTIN) 300 MG capsule Take 1 capsule by mouth at bedtime.    ?  guaiFENesin (MUCINEX) 600 MG 12 hr tablet Take 1 tablet (600 mg total) by mouth 2 (two) times daily.    ? MELATONIN PO Take 5 mg by mouth at bedtime as needed.    ? metFORMIN (GLUCOPHAGE) 1000 MG tablet Take 1 tablet (1,000 mg total) by mouth 2 (two) times daily with a meal. 60 tablet 1  ? mirtazapine (REMERON) 15 MG tablet Take 15 mg by mouth at bedtime.    ? ondansetron (ZOFRAN) 8 MG tablet Take 1 tablet (8 mg total)  by mouth 2 (two) times daily as needed for refractory nausea / vomiting. 60 tablet 2  ? Oxycodone HCl 10 MG TABS Take 1 tablet (10 mg total) by mouth every 6 (six) hours as needed. 60 tablet 0  ? PARoxetine (PAXIL) 10 MG tablet Take 10 mg by mouth daily.    ? pravastatin (PRAVACHOL) 20 MG tablet Take 20 mg by mouth daily.    ? prochlorperazine (COMPAZINE) 10 MG tablet Take 1 tablet (10 mg total) by mouth every 6 (six) hours as needed (Nausea or vomiting). 60 tablet 2  ? sodium chloride 1 g tablet Take 2 tablets (2 g total) by mouth 2 (two) times daily with a meal. 120 tablet 0  ? tamsulosin (FLOMAX) 0.4 MG CAPS capsule Take 0.4 mg by mouth.    ? LORazepam (ATIVAN) 0.5 MG tablet Take 1 tablet (0.5 mg total) by mouth every 8 (eight) hours. 30 tablet 0  ? Morphine Sulfate (MORPHINE CONCENTRATE) 10 mg / 0.5 ml concentrated solution Take 0.25-0.5 mLs (5-10 mg total) by mouth every 2 (two) hours as needed for severe pain. 30 mL 0  ? ?No current facility-administered medications for this visit.  ? ? ?OBJECTIVE: ?Vitals:  ? 08/21/21 0900 08/21/21 0954  ?BP: (!) 81/57   ?Pulse: 76   ?Resp: (!) 24   ?Temp: 98.2 ?F (36.8 ?C)   ?SpO2:  100%  ? ?   Body mass index is 16.36 kg/m?Marland Kitchen    ECOG FS:3 - Symptomatic, >50% confined to bed ? ?General: Thin, ill-appearing, moderate respiratory distress. ?Eyes: Pink conjunctiva, anicteric sclera. ?HEENT: Normocephalic, moist mucous membranes. ?Lungs: No audible wheezing or coughing. ?Heart: Regular rate and rhythm. ?Abdomen: Soft, nontender, no obvious distention. ?Musculoskeletal: No edema, cyanosis, or clubbing. ?Neuro: Alert, answering all questions appropriately. Cranial nerves grossly intact. ?Skin: No rashes or petechiae noted. ?Psych: Flat affect. ? ? ?LAB RESULTS: ? ?Lab Results  ?Component Value Date  ? NA 134 (L) 08/21/2021  ? K 3.3 (L) 08/21/2021  ? CL 98 08/21/2021  ? CO2 18 (L) 08/21/2021  ? GLUCOSE 209 (H) 08/21/2021  ? BUN 27 (H) 08/21/2021  ? CREATININE 0.70 08/21/2021  ?  CALCIUM 8.8 (L) 08/21/2021  ? PROT 5.9 (L) 08/21/2021  ? ALBUMIN 2.8 (L) 08/21/2021  ? AST 52 (H) 08/21/2021  ? ALT 44 08/21/2021  ? ALKPHOS 248 (H) 08/21/2021  ? BILITOT 1.9 (H) 08/21/2021  ? GFRNONAA >60 08/21/2021  ? ? ?Lab Results  ?Component Value Date  ? WBC 17.8 (H) 08/21/2021  ? NEUTROABS 15.5 (H) 08/21/2021  ? HGB 12.7 (L) 08/21/2021  ? HCT 35.8 (L) 08/21/2021  ? MCV 98.1 08/21/2021  ? PLT 127 (L) 08/21/2021  ? ? ? ?STUDIES: ?CT ANGIO HEAD NECK W WO CM ? ?Result Date: 08/12/2021 ?CLINICAL DATA:  Provided history: Neuro deficit, acute, stroke suspected. EXAM: CT ANGIOGRAPHY HEAD AND NECK TECHNIQUE: Multidetector CT imaging of the head and neck was performed using the standard protocol during bolus  administration of intravenous contrast. Multiplanar CT image reconstructions and MIPs were obtained to evaluate the vascular anatomy. Carotid stenosis measurements (when applicable) are obtained utilizing NASCET criteria, using the distal internal carotid diameter as the denominator. RADIATION DOSE REDUCTION: This exam was performed according to the departmental dose-optimization program which includes automated exposure control, adjustment of the mA and/or kV according to patient size and/or use of iterative reconstruction technique. CONTRAST:  44mL OMNIPAQUE IOHEXOL 350 MG/ML SOLN COMPARISON:  Brain MRI 08/11/2021. Head CT 08/11/2021. Chest CT 08/11/2021. FINDINGS: CT HEAD FINDINGS Brain: Mild-to-moderate generalized cerebral atrophy. Comparatively mild cerebellar atrophy. Redemonstrated subacute cortical/subcortical infarct within the left parietooccipital lobes. As before, there is mild petechial hemorrhage within the infarction territory. Unchanged confluent region of hypoattenuation within the left parietal white matter. No associated mass effect is appreciated this is favored to reflect chronic white matter ischemia. No extra-axial fluid collection. No midline shift. Vascular: No hyperdense vessel.  Atherosclerotic calcifications. Skull: No acute fracture or aggressive osseous lesion. Sinuses: Small mucous retention cyst within the right maxillary sinus. Orbits: No orbital mass or acute orbital finding. Review

## 2021-08-24 ENCOUNTER — Ambulatory Visit: Payer: Medicare HMO

## 2021-08-25 ENCOUNTER — Other Ambulatory Visit: Payer: Self-pay | Admitting: Hospice and Palliative Medicine

## 2021-08-25 ENCOUNTER — Ambulatory Visit: Payer: Medicare HMO

## 2021-08-25 MED ORDER — MORPHINE SULFATE (CONCENTRATE) 10 MG /0.5 ML PO SOLN: 20.0000 mg | ORAL | 0 refills | Status: AC | PRN

## 2021-08-25 NOTE — Progress Notes (Signed)
Received call from hospice nurse, April.  She requested refill of morphine elixir.  Patient is uncomfortable in the home and family has been given 10 to 20 mg every 2 hours as needed.  We also discussed liberalizing lorazepam and starting as needed haloperidol for agitation. ?

## 2021-08-26 ENCOUNTER — Ambulatory Visit: Payer: Medicare HMO

## 2021-08-27 ENCOUNTER — Ambulatory Visit: Payer: Medicare HMO

## 2021-08-28 ENCOUNTER — Ambulatory Visit: Payer: Medicare HMO

## 2021-08-31 ENCOUNTER — Ambulatory Visit: Payer: Medicare HMO

## 2021-09-01 ENCOUNTER — Ambulatory Visit: Payer: Medicare HMO

## 2021-09-02 ENCOUNTER — Ambulatory Visit: Payer: Medicare HMO

## 2021-09-03 ENCOUNTER — Ambulatory Visit: Payer: Medicare HMO

## 2021-09-04 ENCOUNTER — Ambulatory Visit: Payer: Medicare HMO

## 2021-09-08 ENCOUNTER — Ambulatory Visit: Payer: Medicare HMO

## 2021-09-09 ENCOUNTER — Ambulatory Visit: Payer: Medicare HMO

## 2021-09-10 ENCOUNTER — Ambulatory Visit: Payer: Medicare HMO

## 2021-09-10 DEATH — deceased

## 2021-09-11 ENCOUNTER — Ambulatory Visit: Payer: Medicare HMO

## 2021-09-12 ENCOUNTER — Ambulatory Visit: Payer: Medicare HMO

## 2021-09-14 ENCOUNTER — Ambulatory Visit: Payer: Medicare HMO

## 2021-09-15 ENCOUNTER — Ambulatory Visit: Payer: Medicare HMO

## 2021-09-16 ENCOUNTER — Ambulatory Visit: Payer: Medicare HMO

## 2021-09-17 ENCOUNTER — Ambulatory Visit: Payer: Medicare HMO

## 2021-09-18 ENCOUNTER — Ambulatory Visit: Payer: Medicare HMO

## 2021-09-21 ENCOUNTER — Ambulatory Visit: Payer: Medicare HMO

## 2021-09-22 ENCOUNTER — Ambulatory Visit: Payer: Medicare HMO

## 2021-09-23 ENCOUNTER — Ambulatory Visit: Payer: Medicare HMO

## 2021-09-24 ENCOUNTER — Ambulatory Visit: Payer: Medicare HMO

## 2021-09-25 ENCOUNTER — Ambulatory Visit: Payer: Medicare HMO

## 2021-09-28 ENCOUNTER — Ambulatory Visit: Payer: Medicare HMO

## 2021-09-29 ENCOUNTER — Ambulatory Visit: Payer: Medicare HMO

## 2021-09-30 ENCOUNTER — Ambulatory Visit: Payer: Medicare HMO

## 2021-10-01 ENCOUNTER — Ambulatory Visit: Payer: Medicare HMO

## 2021-10-02 ENCOUNTER — Ambulatory Visit: Payer: Medicare HMO

## 2021-10-05 ENCOUNTER — Ambulatory Visit: Payer: Medicare HMO

## 2021-10-06 ENCOUNTER — Ambulatory Visit: Payer: Medicare HMO

## 2021-10-07 ENCOUNTER — Ambulatory Visit: Payer: Medicare HMO

## 2021-10-08 ENCOUNTER — Ambulatory Visit: Payer: Medicare HMO

## 2022-05-16 IMAGING — MR MR HEAD WO/W CM
16 of 17 series · 47 of 48 positions shown · IV contrast (gadavist)
Comparison: Brain MRI 06/09/2021

CLINICAL DATA: Brain/CNS neoplasm, staging Neuro deficit,
persistent/recurrent, CNS neoplasm suspected further evaluation
possible neoplasm L parietal lobe seen on noncon MRI.

EXAM:
MRI HEAD WITHOUT AND WITH CONTRAST
TECHNIQUE: Multiplanar, multiecho pulse sequences of the brain and surrounding
structures were obtained without and with intravenous contrast.
CONTRAST:  7.5mL GADAVIST GADOBUTROL 1 MMOL/ML IV SOLN

[Series 5: ax dwi_tracew · axial · 3.0mm · 0.71mm/px · z∈[-97,+68]mm · 2 of 56 slices shown]
[im 1/56]
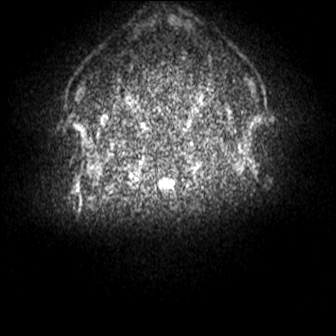
[im 56/56]
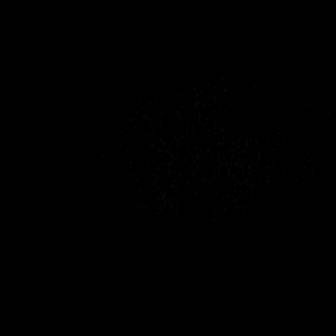

[Series 6: ax dwi_adc · axial · 3.0mm · 0.71mm/px · z∈[-97,+65]mm · 2 of 55 slices shown]
[im 1/55]
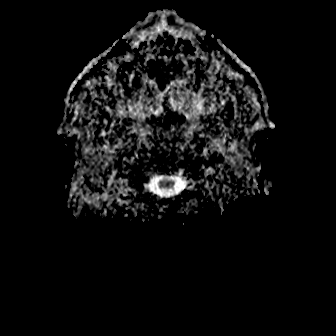
[im 55/55]
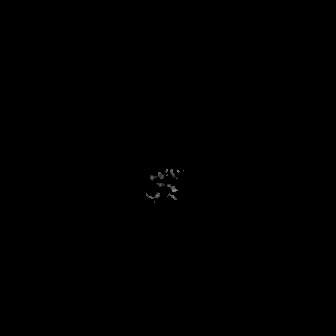

[Series 7: cor dwi_tracew · coronal · 5.0mm · 0.68mm/px · 2 of 40 slices shown]
[im 1/40]
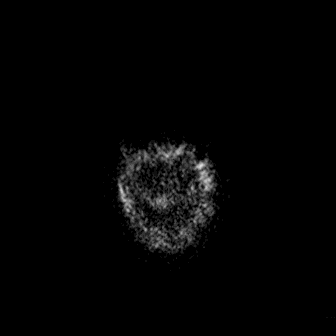
[im 40/40]
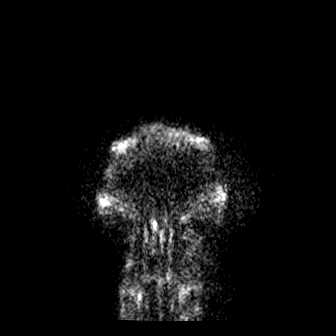

[Series 8: cor dwi_adc · coronal · 5.0mm · 0.68mm/px · 2 of 40 slices shown]
[im 1/40]
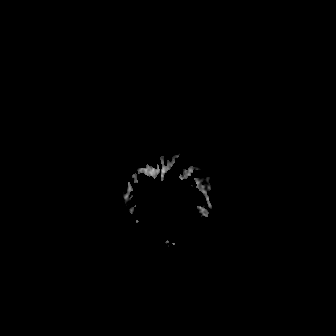
[im 40/40]
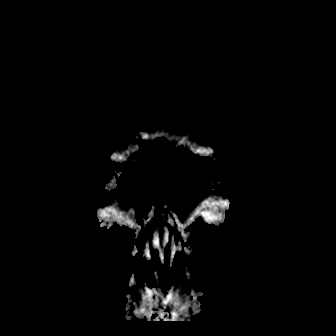

[Series 9: T1 · sagittal · 5.0mm · 0.47mm/px · 1 of 24 slices shown (1 of 2)]
[im 1/24]
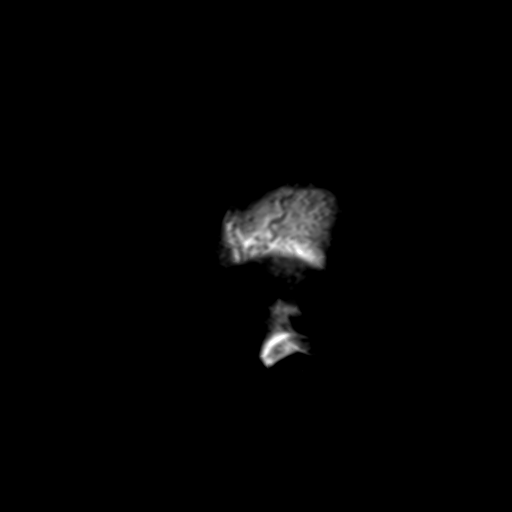

[Series 10: T2 · axial · 5.0mm · 0.86mm/px · 1 of 25 slices shown (1 of 2)]
[im 1/25]
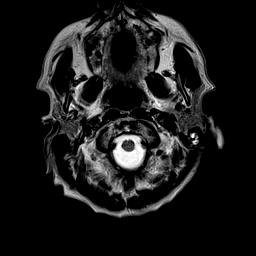

[Series 11: mag_images · axial · 3.0mm · 0.90mm/px · z∈[-90,+63]mm · 3 of 52 slices shown]
[im 1/52]
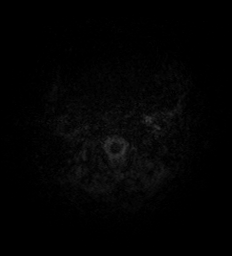
[im 26/52]
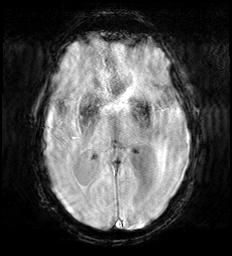
[im 52/52]
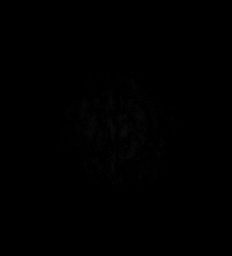

[Series 12: pha_images · axial · 3.0mm · 0.90mm/px · z∈[-90,+63]mm · 3 of 52 slices shown]
[im 1/52]
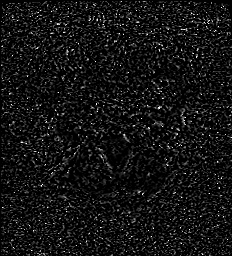
[im 26/52]
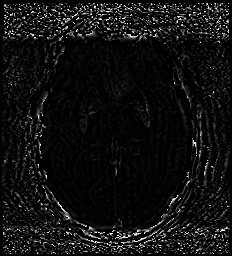
[im 52/52]
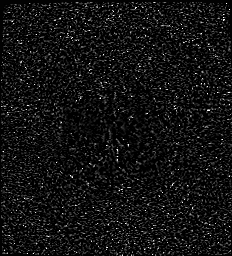

[Series 13: swi_images · axial · 3.0mm · 0.90mm/px · z∈[-90,+63]mm · 3 of 52 slices shown]
[im 1/52]
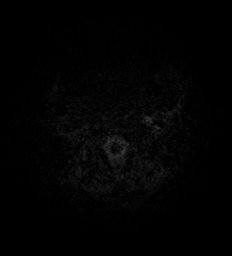
[im 26/52]
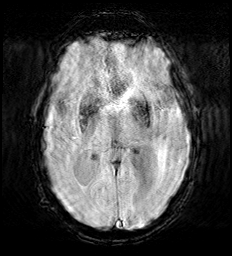
[im 52/52]
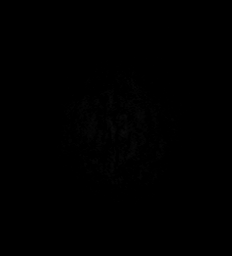

[Series 14: mip_images(sw) · axial · 24.0mm · 0.90mm/px · z∈[-80,+52]mm · 2 of 45 slices shown]
[im 1/45]
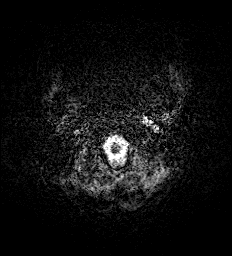
[im 45/45]
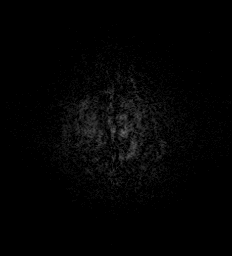

[Series 15: FLAIR · axial · 3.0mm · 0.69mm/px · z∈[-95,+67]mm · 3 of 55 slices shown]
[im 1/55]
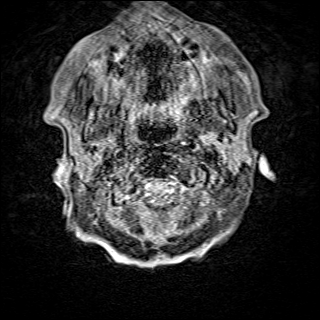
[im 28/55]
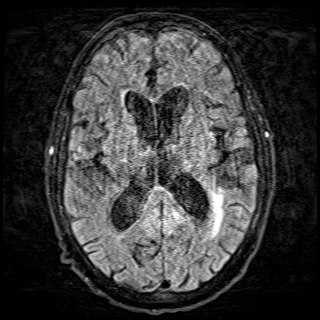
[im 55/55]
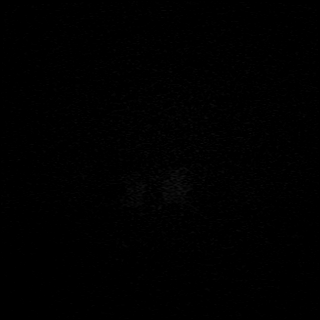

[Series 16: T1 · axial · 1.0mm · 0.98mm/px · z∈[-102,+73]mm · 9 of 175 slices shown (2 of 2)]
[im 1/175]
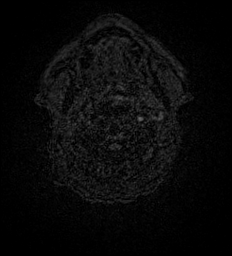
[im 22/175]
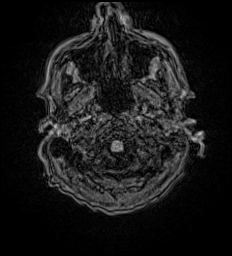
[im 44/175]
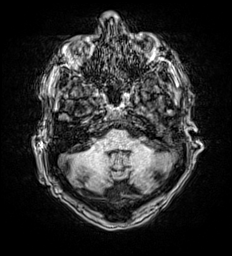
[im 66/175]
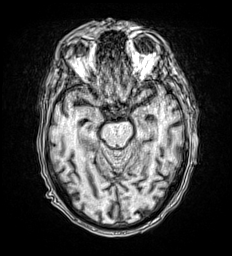
[im 88/175]
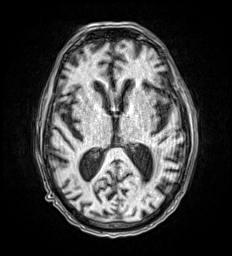
[im 109/175]
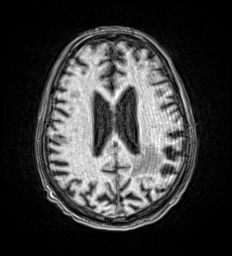
[im 131/175]
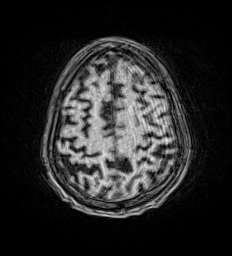
[im 153/175]
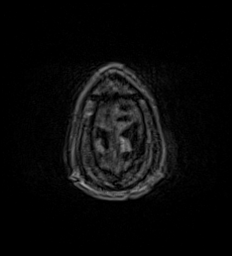
[im 175/175]
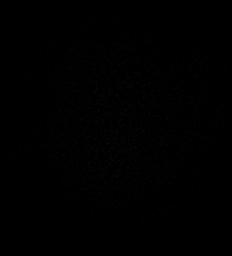

[Series 17: T2 · coronal · 5.0mm · 0.86mm/px · 2 of 30 slices shown (2 of 2)]
[im 1/30]
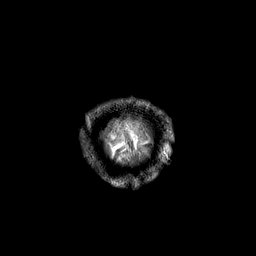
[im 30/30]
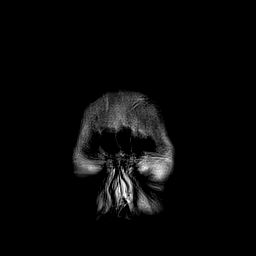

[Series 18: T1 post-contrast · axial · 1.0mm · 0.98mm/px · z∈[-102,+72]mm · 9 of 175 slices shown (1 of 3)]
[im 1/175]
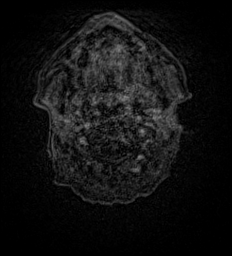
[im 22/175]
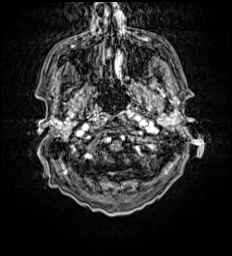
[im 44/175]
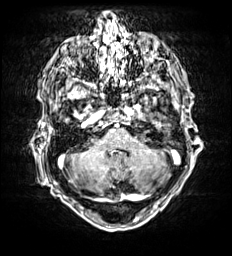
[im 66/175]
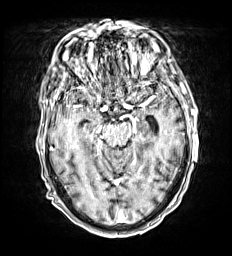
[im 88/175]
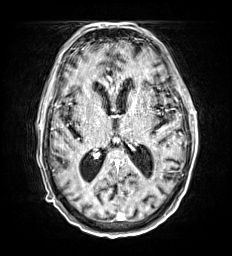
[im 109/175]
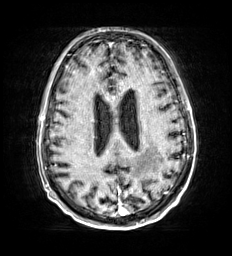
[im 131/175]
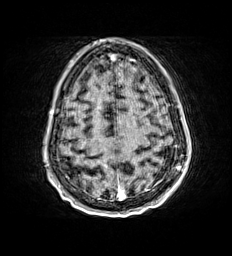
[im 153/175]
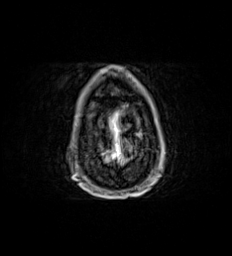
[im 175/175]
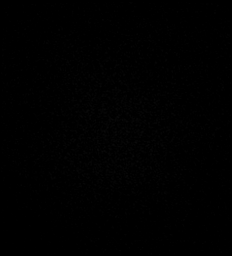

[Series 19: T1 post-contrast · coronal · 5.0mm · 0.43mm/px · 2 of 30 slices shown (2 of 3)]
[im 1/30]
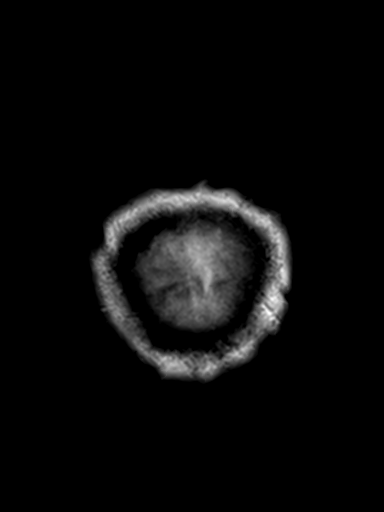
[im 30/30]
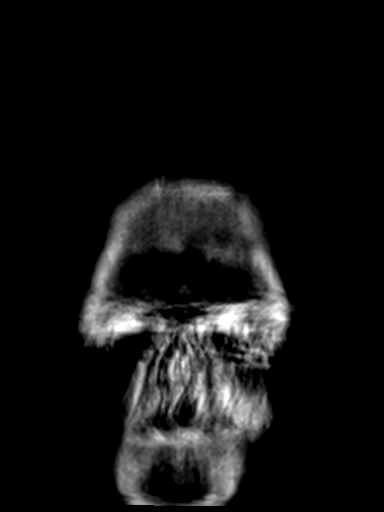

[Series 20: T1 post-contrast · sagittal · 5.0mm · 0.47mm/px · 1 of 24 slices shown (3 of 3)]
[im 1/24]
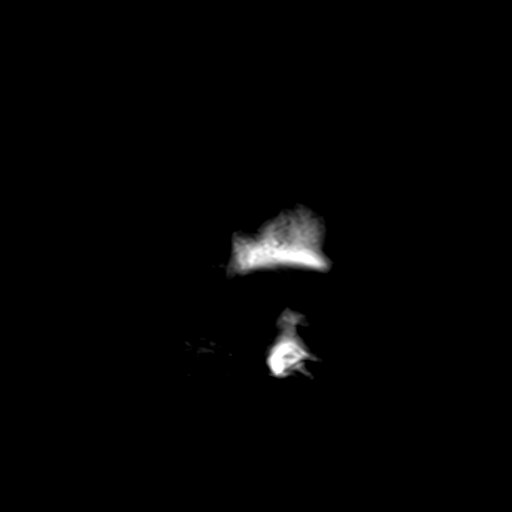

[47 of 48 positions shown; findings below may reference images not displayed]

FINDINGS: Brain: No acute infarct, mass effect or extra-axial collection. No
acute or chronic hemorrhage. Hyperintense T2-weighted signal in the
left parietal white matter. Generalized volume loss. The midline
structures are normal. There is no abnormal contrast enhancement at
the site of the left parietal abnormality.

Vascular: Major flow voids are preserved.

Skull and upper cervical spine: Normal calvarium and skull base.
Visualized upper cervical spine and soft tissues are normal.

Sinuses/Orbits:Left mastoid effusion.  Normal orbits.
IMPRESSION: 1. No abnormal contrast enhancement at the site of the left parietal
abnormality. This is favored to be a benign process such as chronic
ischemia. However, a follow-up brain MRI in 6 months is recommended
to ensure stability.
2. Generalized volume loss and findings of chronic microvascular
disease.
3. Left mastoid effusion.

## 2022-05-19 IMAGING — CR DG CHEST 2V
1 series · 2 of 2 positions shown · non-contrast
Comparison: None.

CLINICAL DATA: Hyponatremia.

EXAM:
CHEST - 2 VIEW

[Series 1: dg chest 2 view · 0.14mm/px · 2 of 2 slices shown]
[im 1/2]
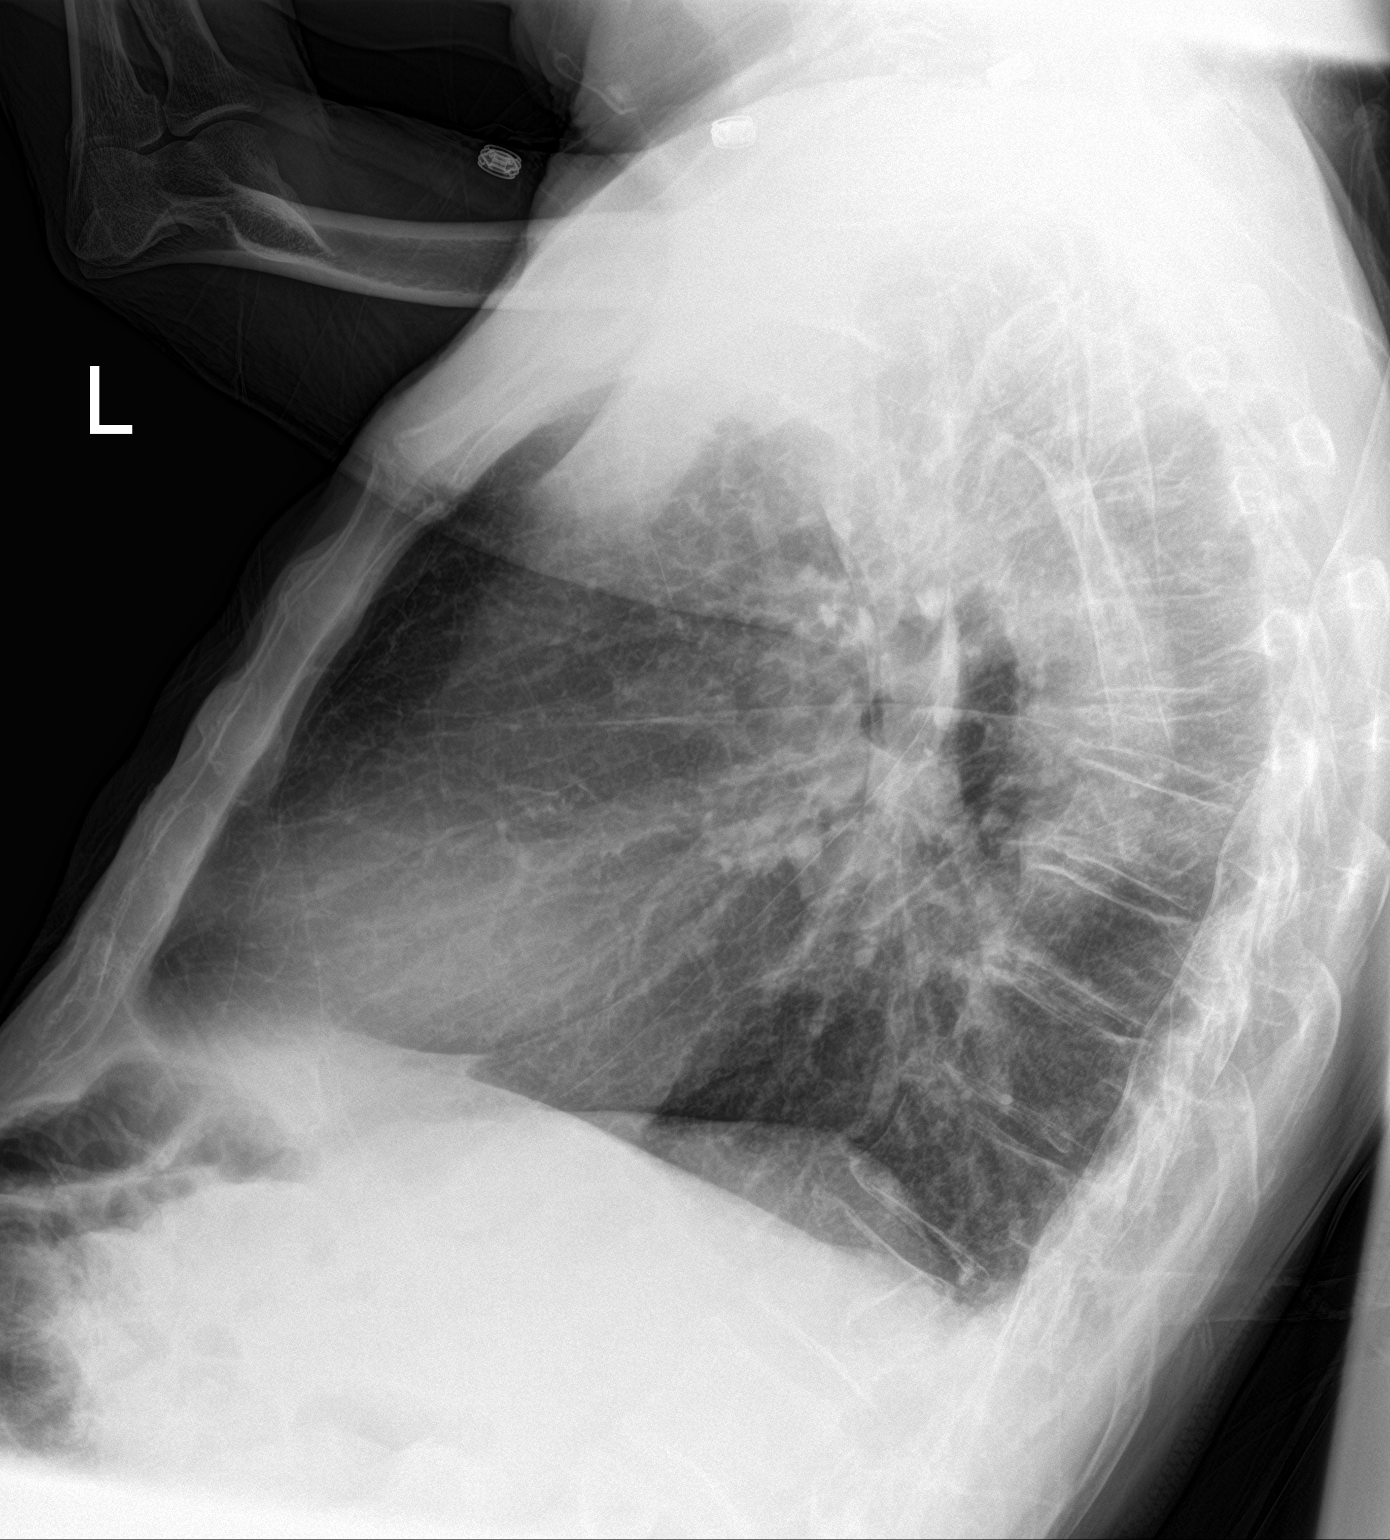
[im 2/2]
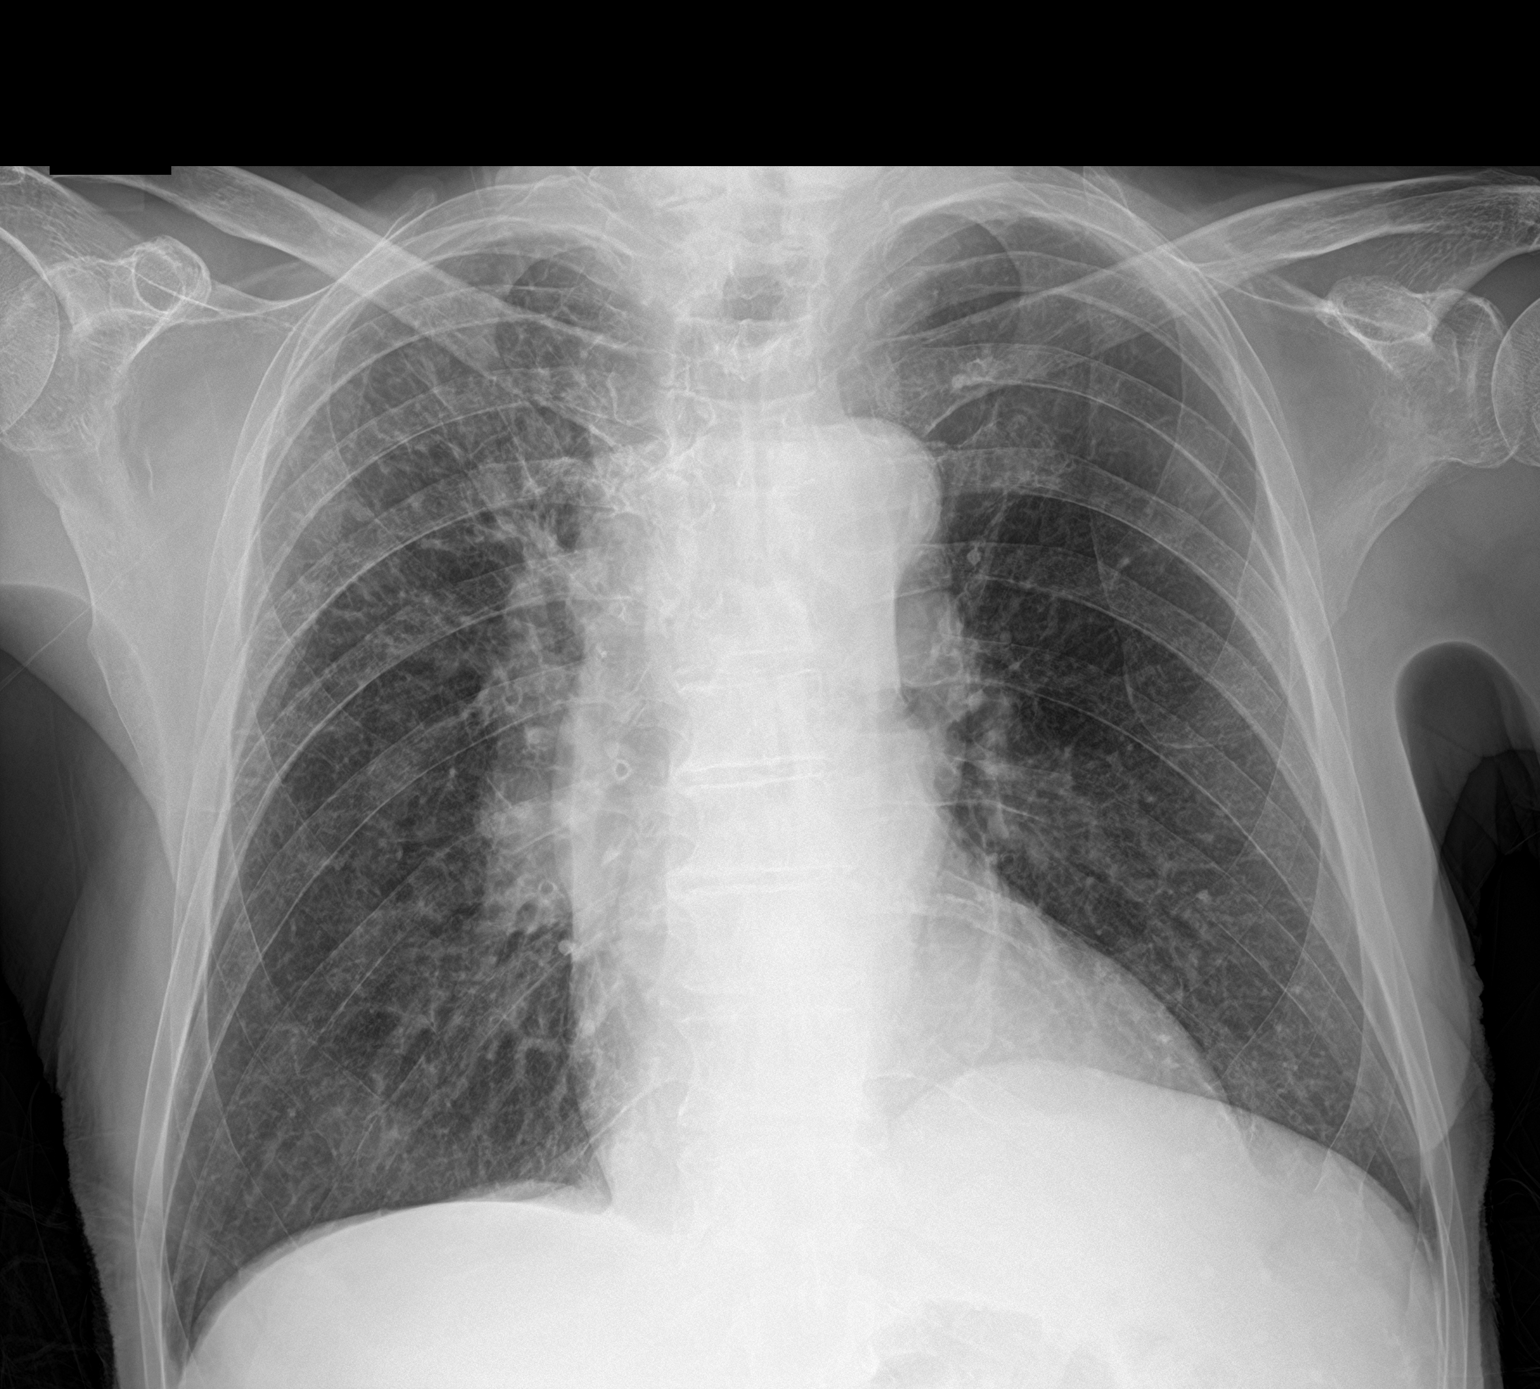

[2 of 2 positions shown; findings below may reference images not displayed]

FINDINGS: Interstitial opacities in the right upper lung. Prominence of the
right paratracheal/suprahilar mediastinal contour. No confluent
consolidation. No visible pleural effusions or pneumothorax. No
evidence of cardiomegaly. No evidence of acute osseous abnormality.
IMPRESSION: 1. Interstitial opacities in the right upper lung, which could
represent atypical infection or asymmetric edema.
2. Prominence of the right paratracheal/suprahilar mediastinal
contour. This could potentially be vascular, but recommend CT of the
chest to exclude adenopathy, mass or aneurysm.

These results will be called to the ordering clinician or
representative by the Radiologist Assistant, and communication
documented in the PACS or [REDACTED].
